# Patient Record
Sex: Female | Born: 1955 | Race: White | Hispanic: No | Marital: Married | Smoking: Never smoker
Health system: Southern US, Community
[De-identification: ages and names within clinical notes are randomized; demographics above are authoritative.]

## PROBLEM LIST (undated history)

## (undated) DIAGNOSIS — M1990 Unspecified osteoarthritis, unspecified site: Secondary | ICD-10-CM

## (undated) DIAGNOSIS — G629 Polyneuropathy, unspecified: Secondary | ICD-10-CM

## (undated) DIAGNOSIS — F419 Anxiety disorder, unspecified: Secondary | ICD-10-CM

## (undated) DIAGNOSIS — M797 Fibromyalgia: Secondary | ICD-10-CM

## (undated) DIAGNOSIS — M14679 Charcot's joint, unspecified ankle and foot: Secondary | ICD-10-CM

## (undated) DIAGNOSIS — E119 Type 2 diabetes mellitus without complications: Secondary | ICD-10-CM

## (undated) DIAGNOSIS — C4491 Basal cell carcinoma of skin, unspecified: Secondary | ICD-10-CM

## (undated) HISTORY — DX: Anxiety disorder, unspecified: F41.9

## (undated) HISTORY — PX: ACHILLES TENDON REPAIR: SUR1153

## (undated) HISTORY — PX: CARPAL TUNNEL RELEASE: SHX101

## (undated) HISTORY — DX: Type 2 diabetes mellitus without complications: E11.9

## (undated) HISTORY — DX: Basal cell carcinoma of skin, unspecified: C44.91

## (undated) HISTORY — DX: Polyneuropathy, unspecified: G62.9

## (undated) HISTORY — PX: COLONOSCOPY: SHX174

## (undated) HISTORY — DX: Unspecified osteoarthritis, unspecified site: M19.90

## (undated) HISTORY — DX: Fibromyalgia: M79.7

## (undated) HISTORY — PX: BREAST SURGERY: SHX581

## (undated) HISTORY — DX: Charcot's joint, unspecified ankle and foot: M14.679

## (undated) HISTORY — PX: OTHER SURGICAL HISTORY: SHX169

---

## 1998-04-20 ENCOUNTER — Other Ambulatory Visit: Admission: RE | Admit: 1998-04-20 | Discharge: 1998-04-20 | Payer: Self-pay | Admitting: Obstetrics and Gynecology

## 1999-10-06 ENCOUNTER — Encounter: Admission: RE | Admit: 1999-10-06 | Discharge: 1999-10-06 | Payer: Self-pay | Admitting: Obstetrics and Gynecology

## 1999-10-06 ENCOUNTER — Encounter: Payer: Self-pay | Admitting: Obstetrics and Gynecology

## 2001-01-08 ENCOUNTER — Encounter: Admission: RE | Admit: 2001-01-08 | Discharge: 2001-01-08 | Payer: Self-pay | Admitting: Obstetrics and Gynecology

## 2001-01-08 ENCOUNTER — Encounter: Payer: Self-pay | Admitting: Obstetrics and Gynecology

## 2002-04-13 ENCOUNTER — Encounter: Admission: RE | Admit: 2002-04-13 | Discharge: 2002-04-13 | Payer: Self-pay | Admitting: Obstetrics and Gynecology

## 2002-04-13 ENCOUNTER — Encounter: Payer: Self-pay | Admitting: Obstetrics and Gynecology

## 2003-05-22 ENCOUNTER — Emergency Department (HOSPITAL_COMMUNITY): Admission: EM | Admit: 2003-05-22 | Discharge: 2003-05-23 | Payer: Self-pay | Admitting: Emergency Medicine

## 2003-09-08 ENCOUNTER — Encounter: Admission: RE | Admit: 2003-09-08 | Discharge: 2003-09-08 | Payer: Self-pay | Admitting: Obstetrics and Gynecology

## 2004-03-23 ENCOUNTER — Ambulatory Visit (HOSPITAL_COMMUNITY): Admission: RE | Admit: 2004-03-23 | Discharge: 2004-03-23 | Payer: Self-pay | Admitting: Family Medicine

## 2004-09-11 ENCOUNTER — Ambulatory Visit (HOSPITAL_COMMUNITY): Admission: RE | Admit: 2004-09-11 | Discharge: 2004-09-11 | Payer: Self-pay | Admitting: Obstetrics and Gynecology

## 2006-03-04 ENCOUNTER — Ambulatory Visit (HOSPITAL_COMMUNITY): Admission: RE | Admit: 2006-03-04 | Discharge: 2006-03-04 | Payer: Self-pay | Admitting: Obstetrics and Gynecology

## 2006-07-26 ENCOUNTER — Ambulatory Visit (HOSPITAL_COMMUNITY): Admission: RE | Admit: 2006-07-26 | Discharge: 2006-07-26 | Payer: Self-pay | Admitting: Family Medicine

## 2006-11-14 ENCOUNTER — Ambulatory Visit: Payer: Self-pay | Admitting: Orthopedic Surgery

## 2007-03-04 ENCOUNTER — Encounter: Admission: RE | Admit: 2007-03-04 | Discharge: 2007-03-04 | Payer: Self-pay | Admitting: Obstetrics and Gynecology

## 2008-05-05 ENCOUNTER — Ambulatory Visit (HOSPITAL_COMMUNITY): Admission: RE | Admit: 2008-05-05 | Discharge: 2008-05-05 | Payer: Self-pay | Admitting: Family Medicine

## 2009-07-12 ENCOUNTER — Ambulatory Visit (HOSPITAL_COMMUNITY): Admission: RE | Admit: 2009-07-12 | Discharge: 2009-07-12 | Payer: Self-pay | Admitting: Internal Medicine

## 2009-10-22 HISTORY — PX: DILATION AND CURETTAGE OF UTERUS: SHX78

## 2009-11-22 ENCOUNTER — Ambulatory Visit (HOSPITAL_COMMUNITY): Admission: RE | Admit: 2009-11-22 | Discharge: 2009-11-22 | Payer: Self-pay | Admitting: Obstetrics and Gynecology

## 2010-01-23 ENCOUNTER — Encounter: Payer: Self-pay | Admitting: Internal Medicine

## 2010-01-27 ENCOUNTER — Ambulatory Visit: Payer: Self-pay | Admitting: Internal Medicine

## 2010-01-27 ENCOUNTER — Ambulatory Visit (HOSPITAL_COMMUNITY): Admission: RE | Admit: 2010-01-27 | Discharge: 2010-01-27 | Payer: Self-pay | Admitting: Internal Medicine

## 2010-10-05 ENCOUNTER — Ambulatory Visit (HOSPITAL_COMMUNITY)
Admission: RE | Admit: 2010-10-05 | Discharge: 2010-10-05 | Payer: Self-pay | Source: Home / Self Care | Attending: Obstetrics and Gynecology | Admitting: Obstetrics and Gynecology

## 2010-11-12 ENCOUNTER — Encounter: Payer: Self-pay | Admitting: Family Medicine

## 2010-11-13 ENCOUNTER — Encounter: Payer: Self-pay | Admitting: Family Medicine

## 2010-11-21 NOTE — Letter (Signed)
Summary: Internal Other Domingo Dimes  Internal Other Domingo Dimes   Imported By: Cloria Spring LPN 16/07/9603 54:09:81  _____________________________________________________________________  External Attachment:    Type:   Image     Comment:   External Document

## 2011-01-08 LAB — URINALYSIS, ROUTINE W REFLEX MICROSCOPIC
Glucose, UA: NEGATIVE mg/dL
Hgb urine dipstick: NEGATIVE
Nitrite: NEGATIVE
Protein, ur: NEGATIVE mg/dL
Urobilinogen, UA: 0.2 mg/dL (ref 0.0–1.0)
pH: 6.5 (ref 5.0–8.0)

## 2011-01-08 LAB — COMPREHENSIVE METABOLIC PANEL
ALT: 22 U/L (ref 0–35)
Albumin: 4.2 g/dL (ref 3.5–5.2)
BUN: 12 mg/dL (ref 6–23)
Chloride: 96 mEq/L (ref 96–112)
GFR calc Af Amer: >60 mL/min (ref >60)
GFR calc non Af Amer: >60 mL/min (ref >60)
Glucose, Bld: 104 mg/dL — ABNORMAL HIGH (ref 70–99)
Potassium: 2.9 mEq/L — ABNORMAL LOW (ref 3.5–5.1)
Sodium: 139 mEq/L (ref 135–145)

## 2011-01-08 LAB — CBC
HCT: 45.1 % (ref 36.0–46.0)
Hemoglobin: 15.3 g/dL — ABNORMAL HIGH (ref 12.0–15.0)
MCHC: 33.8 g/dL (ref 30.0–36.0)

## 2011-01-08 LAB — CA 125: CA 125: 10.9 U/mL (ref 0.0–30.2)

## 2011-01-10 LAB — GLUCOSE, CAPILLARY

## 2011-08-13 ENCOUNTER — Ambulatory Visit (HOSPITAL_COMMUNITY)
Admission: RE | Admit: 2011-08-13 | Discharge: 2011-08-13 | Disposition: A | Discharge status: Home / Self Care | Payer: BC Managed Care – PPO | Source: Ambulatory Visit | Attending: Podiatry | Admitting: Podiatry

## 2011-08-13 DIAGNOSIS — R29898 Other symptoms and signs involving the musculoskeletal system: Secondary | ICD-10-CM | POA: Insufficient documentation

## 2011-08-13 DIAGNOSIS — M6281 Muscle weakness (generalized): Secondary | ICD-10-CM | POA: Insufficient documentation

## 2011-08-13 DIAGNOSIS — M25676 Stiffness of unspecified foot, not elsewhere classified: Secondary | ICD-10-CM | POA: Insufficient documentation

## 2011-08-13 DIAGNOSIS — IMO0001 Reserved for inherently not codable concepts without codable children: Secondary | ICD-10-CM | POA: Insufficient documentation

## 2011-08-13 DIAGNOSIS — M25579 Pain in unspecified ankle and joints of unspecified foot: Secondary | ICD-10-CM | POA: Insufficient documentation

## 2011-08-13 DIAGNOSIS — M25673 Stiffness of unspecified ankle, not elsewhere classified: Secondary | ICD-10-CM | POA: Insufficient documentation

## 2011-08-13 DIAGNOSIS — R262 Difficulty in walking, not elsewhere classified: Secondary | ICD-10-CM | POA: Insufficient documentation

## 2011-08-14 ENCOUNTER — Ambulatory Visit (HOSPITAL_COMMUNITY)
Admission: RE | Admit: 2011-08-14 | Discharge: 2011-08-14 | Disposition: A | Discharge status: Home / Self Care | Payer: BC Managed Care – PPO | Source: Ambulatory Visit | Attending: Podiatry | Admitting: Podiatry

## 2011-08-14 NOTE — Progress Notes (Cosign Needed)
Physical Therapy Evaluation  Patient Details  Name: Angela Norman MRN: [id number] Date of Birth: [birth year]  Today's Date: 08/14/2011 Time: 850-930  Charges: 1 eval Visit#: 1 of 12 Re-eval: 09/12/11   Subjective Symptoms/Limitations Symptoms: Pt reports that she was at work and heard a pop in her ankle on 8/16.  She initally went to her PCP who then referred her to Dr. Nolen Mu.  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  Had surgery on 06/21/11.  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  Job requirments: Sitting most of the day, but requires a lot of standing and sitting.  She is active in her church and lives with her husband.  PMH: Fibromyaligia, DM (w/foot neuropathy), charcot-tooth.   Plan to return to work on Nov. 16th  She has been living with her mother in law secondary to no stairs at her home.   In a cast for 7 weeks.  Transitioned to the boot on 08/07/11 How long can you sit comfortably?: No difficulty, has some increased swelling in dependent position.  How long can you stand comfortably?: standing in the CAM boot without difficulty How long can you walk comfortably?: Ambulating without RW in the house, uses RW for community ambulation.  Has increased weakness to her LE secondary to housebound x7 weeks.  In a cast for 7 weeks.  Pain Assessment Currently in Pain?: Yes Pain Score: 0-No pain Pain Location: Ankle Pain Orientation: Left  Precautions/Restrictions  Precautions Precaution Comments: FWB in CAM boot Required Braces or Orthoses: Yes (CAM) Restrictions Weight Bearing Restrictions: Yes Other Position/Activity Restrictions: FWB in CAM   08/13/11 0900  Assessment  Diagnosis L achilles repair  Surgical Date 06/21/11  Next MD Visit 08/21/11  Prior Therapy None  Precautions  Precaution Comments FWB in CAM boot  Required Braces or Orthoses Yes (CAM)  Restrictions  Weight Bearing  Restrictions Yes  Other Position/Activity Restrictions FWB in CAM  Home Living  Lives With Spouse  Additional Comments 6 stairs at church, 5 stairs at home.   Prior Function  Level of Independence Independent with homemaking with wheelchair;Independent with gait  Able to Take Stairs? Yes  Driving Yes  Vocation Full time employment  Systems developer  Leisure Hobbies-yes (Comment)  Comments Active in her church  Functional Tests  Functional Tests Figure 8:L: 58.3 cm, R: 57 cm Transmetatarsal: L: 30.6cm, R:26.2cm (R Charcot foot deformity )  Functional Tests LEFS: 12/80  LLE AROM (degrees)  Left Ankle Dorsiflexion 0-20 5   Left Ankle Plantar Flexion 0-45 25   Left Ankle Inversion 5  (PROM: 30)  Left Ankle Eversion 20  (PROM: 25)  LLE PROM (degrees)  Left Ankle Dorsiflexion 0-20 -10  (From neutral)  Left Ankle Plantar Flexion 0-45 30  LLE Strength  Left Hip Flexion 4/5  Left Hip ABduction 4/5  Left Hip ADduction 4/5  Left Knee Flexion 4/5  Left Knee Extension 4/5  Left Ankle Dorsiflexion 3/5  Left Ankle Plantar Flexion 2/5  Left Ankle Inversion 3/5  Left Ankle Eversion 3/5  Ambulation/Gait  Ambulation/Gait Yes  Ambulation/Gait Assistance 7: Independent  Assistive device Other (Comment) (CAM)  Gait Pattern Decreased step length - right;Decreased stance time - left;Decreased hip/knee flexion - right (Downward gaze)  Static Standing Balance  Rhomberg - Eyes Closed 10   Rhomberg - Eyes Opened 10   Tandem Stance - Left Leg 3  (impaired ankle/hip strategy)  Tandem Stance - Right Leg 7  (impaired ankle/hip strategy)  Single Leg Stance - Left Leg 0  (impaired ankle/hip strategy)  Single Leg Stance - Right Leg 4  (impaired ankle/hip strategy)  Static Standing - Comment/# of Minutes In CAM boot, held for up to 10 sec    Observation: Wound is healing well, - for s/s of DVT  Exercise/Treatments  08/13/11 0900  Ankle Exercises  Ankle  Circles/Pumps Left;10 reps  Ankle Dorsiflexion Left;10 reps  Ankle Plantar Flexion Left;10 reps  Ankle Eversion Left;10 reps  Ankle Inversion Left;10 reps  Heel Raises 10 reps;Limitations  Heel Raises Limitations Seated  Toe Raise 10 reps;Limitations  Toe Raise Limitations Seated        Physical Therapy Assessment and Plan    Clinical Impression Statement Pt is a 55 year old female referred to PT s/p L achilles repair. After examination it was found that she has current body structure impairments including decreased L ankle ROM, decrease L ankle strength, decreased activity tolerance, difficulty walking and impaired percieved functional ability which are limiting her ability to participate in household and community activities. Pt will benefit from skilled outpatient PT in order to address the above impairments in order to maximize independence and function.  Rehab Potential Clinical Impairments Affecting Rehab Potential PT Frequency Min 3X/week PT Duration 8 weeks PT Plan FWB in CAM BOOT. Ankle isometrics for HEP, add T-band (Red), bike, towel scrunches  Goals  Home Exercise Program: Pt will Perform Home Exercise Program Independently PT Goal: Perform Home Exercise Program - Progress PT Short Term Goals: Time to Complete Short Term Goals 2 weeks PT Short Term Goal 1 Pt will improve L ankle strength by 1 muscle grade. PT Short Term Goal 1 - Progress PT Short Term Goal 2 Pt will improve ankle AROM in each direction by 5 degrees. PT Short Term Goal 2 - Progress PT Short Term Goal 3 Pt will demonstrate SLS in CAM boot x10 sec on static surface. PT Short Term Goal 3 - Progress PT Short Term Goal 4 PT Short Term Goal 4 - Progress PT Short Term Goal 5 PT Short Term Goal 5 - Progress Additional PT Short Term Goals? PT Long Term Goals: Time to Complete Long Term Goals PT Long Term Goal 1 Pt will independently asecend and descend 6 stairs with reciprocal pattern w/1 handrail. PT Long Term Goal 1 - Progress  PT Long Term Goal 2 Pt will increase LE strength in order to ambulate with appropriate gait mechanics to decrease risk of secondary impairments. PT Long Term Goal 2 - Progress Long Term Goal 3 Pt will demonstrate independent tandem gait x50 feet on dynamic surface to improve safety with outdoor mobility.    Problem List Patient Active Problem List  Diagnoses  . Stiffness of joint, not elsewhere classified, ankle and foot  . Difficulty in walking  . Ankle weakness        Yuepheng Schaller 08/14/2011, 4:32 PM  Physician Documentation Your signature is required to indicate approval of the treatment plan as stated above.  Please sign and either send electronically or make a copy of this report for your files and return this physician signed original.   Please mark one 1.__approve of plan  2. ___approve of plan with the following conditions.   ______________________________  _____________________ Physician Signature                                                                                                             Date

## 2011-08-14 NOTE — Progress Notes (Signed)
Physical Therapy Treatment Patient Details  Name: Angela Norman MRN: [id number] Date of Birth: 01-13-1956  Today's Date: 08/14/2011 Time: 4098-1191 Time Calculation (min): 36 min Visit#: 2  of 12   Re-eval: 09/12/11 Charges: Therex x 34'   Subjective: Symptoms/Limitations Symptoms: Pt reports some soreness after last tx. No pain today. Pain Assessment Currently in Pain?: No/denies   Exercise/Treatments Ankle Exercises Ankle Dorsiflexion: 10 reps;Strengthening (isometric) Ankle Plantar Flexion: 10 reps;Strengthening (isometric) Ankle Eversion: 10 reps;Strengthening (isometric) Ankle Inversion: 10 reps;Strengthening (isometric) Heel Raises: 10 reps;Limitations Heel Raises Limitations: Seated Toe Raise: 10 reps;Limitations Toe Raise Limitations: Seated  Towel Crunch: Limitations Towel Crunch Limitations: 1' Towel Inversion/Eversion: 5 reps  PROM all directions  Physical Therapy Assessment and Plan PT Assessment and Plan Clinical Impression Statement: Pt completes therex with minimal difficulty. Pt requires multimodal cueing to avoid knee movement with BAPS board and ev/in with towel. Pt without pain increase at end of session. PT Treatment/Interventions: Therapeutic exercise PT Plan: Continue per PT POC.     Problem List Patient Active Problem List  Diagnoses  . Stiffness of joint, not elsewhere classified, ankle and foot  . Difficulty in walking  . Ankle weakness    PT - End of Session Activity Tolerance: Patient tolerated treatment well General Behavior During Session: [facility] for tasks performed Cognition: [facility] for tasks performed  Antonieta Iba 08/14/2011, 3:52 PM

## 2011-08-15 ENCOUNTER — Ambulatory Visit (HOSPITAL_COMMUNITY)
Admission: RE | Admit: 2011-08-15 | Discharge: 2011-08-15 | Disposition: A | Discharge status: Home / Self Care | Payer: BC Managed Care – PPO | Source: Ambulatory Visit | Attending: Podiatry | Admitting: Podiatry

## 2011-08-15 NOTE — Progress Notes (Signed)
Physical Therapy Treatment Patient Details  Name: Angela Norman MRN: [id number] Date of Birth: 1956-05-23  Today's Date: 08/15/2011 Time: 4098-1191 Time Calculation (min): 41 min Visit#: 3  of 12   Re-eval: 09/12/11 Charges: Therex x 38'  Subjective: Symptoms/Limitations Symptoms: Pt reports some soreness after last tx but not as bad as it has been. Pt is painfree at entry. Pain Assessment Currently in Pain?: No/denies  Precautions/Restrictions     Mobility (including Balance)       Exercise/Treatments Ankle Exercises Ankle Dorsiflexion: 15 reps;Strengthening (isometric) Ankle Plantar Flexion: 15 reps;Strengthening (isometric) Ankle Eversion: 15 reps;Strengthening (isometric) Ankle Inversion: 15 reps (isometric) Heel Raises: 15 reps Heel Raises Limitations: Seated Toe Raise: 15 reps Toe Raise Limitations: Seated  Towel Crunch: Limitations Towel Crunch Limitations: 2' Towel Inversion/Eversion: 5 reps Towel Inversion/Eversion Limitations: 3#   Physical Therapy Assessment and Plan PT Assessment and Plan Clinical Impression Statement: Pt displays icnreased gastroc strength this tx. Pt completes therex with increased eases and decreased need for cueing. Pt with increased control with BAPS board ex. Pt reports no increase in pain at end of session. PT Treatment/Interventions: Therapeutic exercise PT Plan: Continue to progress per PT POC.     Problem List Patient Active Problem List  Diagnoses  . Stiffness of joint, not elsewhere classified, ankle and foot  . Difficulty in walking  . Ankle weakness    PT - End of Session Activity Tolerance: Patient tolerated treatment well General Behavior During Session: [facility] for tasks performed Cognition: [facility] for tasks performed  Antonieta Iba 08/15/2011, 9:31 AM

## 2011-08-20 ENCOUNTER — Ambulatory Visit (HOSPITAL_COMMUNITY)
Admission: RE | Admit: 2011-08-20 | Discharge: 2011-08-20 | Disposition: A | Discharge status: Home / Self Care | Payer: BC Managed Care – PPO | Source: Ambulatory Visit | Attending: Podiatry | Admitting: Podiatry

## 2011-08-20 NOTE — Progress Notes (Signed)
Physical Therapy Treatment Patient Details  Name: Angela Norman MRN: [id number] Date of Birth: [birth year]  Today's Date: 08/20/2011 Time: 1117-1208 Time Calculation (min): 51 min Visit#: 4  of 12   Re-eval: 09/12/11  Charge: therex: 51 min  Subjective: Pain free today    Objective:   Exercise/Treatments Ankle Dorsiflexion: 15 reps;Strengthening (isometric)     10 reps with green tband Ankle Plantar Flexion: 15 reps;Strengthening (isometric)     10 reps with green tband Ankle Eversion: 15 reps;Strengthening (isometric)     10 reps with green tband Ankle Inversion: 15 reps (isometric)     10 reps with green tband Heel Raises: 15 reps  Heel Raises Limitations: Seated  Toe Raise: 15 reps  Toe Raise Limitations: Seated  Gastroc st seated with slant board x 30 sec BAPS seated 10 reps all directions Level 2 Towel Crunch: Limitations  Towel Crunch Limitations: 2'  Towel Inversion/Eversion: 5 reps  Towel Inversion/Eversion Limitations: 3#  Standing: 3x 30" 1 set B UE, 1 set 2 fingers, 1 set 1 finger Lateral step up 4" step 10 reps Forward step up 4" step 10 reps Rockerboard 2' R/L; A/P  Physical Therapy Assessment and Plan PT Assessment and Plan Clinical Impression Statement: Began balance activities standing with boot on, pt required UE A with SLS, able to reduce to 1 finger on parallel bars.  All therex complete with increased eases and decreased need for cueing.  Began tband exercises for ankle strengthening, gave pt tband and worksheet to add to HEP. PT Plan: Continue to progress per PT POC.  Pt to go to MD 08/21/2011, assess therex to be complete without boot.    Goals    Problem List Patient Active Problem List  Diagnoses  . Stiffness of joint, not elsewhere classified, ankle and foot  . Difficulty in walking  . Ankle weakness    PT - End of Session Activity Tolerance: Patient tolerated treatment well General Behavior During Session: [name] for tasks  performed Cognition: [name] for tasks performed  [name] 08/20/2011, 4:07 PM

## 2011-08-22 ENCOUNTER — Ambulatory Visit (HOSPITAL_COMMUNITY)
Admission: RE | Admit: 2011-08-22 | Discharge: 2011-08-22 | Disposition: A | Discharge status: Home / Self Care | Payer: BC Managed Care – PPO | Source: Ambulatory Visit | Attending: Podiatry | Admitting: Podiatry

## 2011-08-22 NOTE — Progress Notes (Signed)
Physical Therapy Treatment Patient Details  Name: Angela Norman MRN: [id number] Date of Birth: 1955/11/25  Today's Date: 08/22/2011 Time: 1191-4782 Time Calculation (min): 50 min Visit#: 5  of 12   Re-eval: 09/12/11 Charges:  therex 30', gait 12'    Subjective:  Pt. Reports more soreness than anything else.  Went to MD yesterday and rec'd orders to begin weaning out of CAM boot.    Exercise/Treatments Stretches Gastroc Stretch: 30 seconds;Limitations Theme park manager Limitations: standing with slant board Aerobic Stationary Bike: 6'@2 .5 seat 8 Standing Heel Raises: 10 reps;Limitations Heel Raises Limitations: toeraises 10 reps Lateral Step Up: Step Height: 4";10 reps Forward Step Up: 10 reps;Step Height: 4" SLS: 2X30" without boot, HHA Gait Training: without CAM walker with SPC Seated Other Seated Knee Exercises: towel crunches 1 min X2 Other Seated Knee Exercises: Towel inv/ever 3# 5X each      Physical Therapy Assessment and Plan PT Assessment and Plan Clinical Impression Statement: Progressed standing activities without boot.  Pt. encouraged to begin ambulating without boot 1/4 of day in home (continue wearing boot in community).  Pt. instructed to bring shoe next visit.   PT Treatment/Interventions: Therapeutic exercise;Gait training PT Plan: Continue per POC; wean out of boot and increase stability of LE.    Problem List Patient Active Problem List  Diagnoses  . Stiffness of joint, not elsewhere classified, ankle and foot  . Difficulty in walking  . Ankle weakness    PT - End of Session Activity Tolerance: Patient tolerated treatment well General Behavior During Session: [facility] for tasks performed Cognition: [facility] for tasks performed  Emeline Gins B 08/22/2011, 1:48 PM

## 2011-08-24 ENCOUNTER — Ambulatory Visit (HOSPITAL_COMMUNITY)
Admission: RE | Admit: 2011-08-24 | Discharge: 2011-08-24 | Disposition: A | Discharge status: Home / Self Care | Payer: BC Managed Care – PPO | Source: Ambulatory Visit | Attending: Podiatry | Admitting: Podiatry

## 2011-08-24 DIAGNOSIS — M25676 Stiffness of unspecified foot, not elsewhere classified: Secondary | ICD-10-CM | POA: Insufficient documentation

## 2011-08-24 DIAGNOSIS — M6281 Muscle weakness (generalized): Secondary | ICD-10-CM | POA: Insufficient documentation

## 2011-08-24 DIAGNOSIS — M25673 Stiffness of unspecified ankle, not elsewhere classified: Secondary | ICD-10-CM | POA: Insufficient documentation

## 2011-08-24 DIAGNOSIS — R262 Difficulty in walking, not elsewhere classified: Secondary | ICD-10-CM | POA: Insufficient documentation

## 2011-08-24 DIAGNOSIS — M25579 Pain in unspecified ankle and joints of unspecified foot: Secondary | ICD-10-CM | POA: Insufficient documentation

## 2011-08-24 DIAGNOSIS — IMO0001 Reserved for inherently not codable concepts without codable children: Secondary | ICD-10-CM | POA: Insufficient documentation

## 2011-08-24 NOTE — Progress Notes (Signed)
Physical Therapy Treatment Patient Details  Name: Angela Norman MRN: [id number] Date of Birth: 1956/04/14  Today's Date: 08/24/2011 Time: 4098-1191 Time Calculation (min): 43 min Visit#: 6  of 12   Re-eval: 09/12/11 Charges: Gait training x 10' Therex x 30'  Subjective: Symptoms/Limitations Symptoms: I feel very drained. I was sore in my knees after using the bike last time. Pain Assessment Currently in Pain?: No/denies Pain Score: 0-No pain   Exercise/Treatments Standing Heel Raises: 10 reps;Limitations Heel Raises Limitations: toeraises 10 reps Lateral Step Up: Step Height: 4";10 reps Forward Step Up: 10 reps;Step Height: 4" SLS: 3X30" without boot, 1 finger assist Slant board stretch  3x30" Seated BAPS: Level 2;10 reps;Sitting  Gt around dept with walking shoe w/o AD x 10'  Physical Therapy Assessment and Plan PT Assessment and Plan Clinical Impression Statement: Pt brought walking shoe that was given to her at the hospital. Gt training completed in walking shoe. Therex completed without orthotic. Pt requires TC's to disassociate forefoot from metatarsals with seated heel raise.  PT Treatment/Interventions: Therapeutic exercise;Gait training PT Plan: Continue to progress per PT POC.     Problem List Patient Active Problem List  Diagnoses  . Stiffness of joint, not elsewhere classified, ankle and foot  . Difficulty in walking  . Ankle weakness    PT - End of Session Activity Tolerance: Patient tolerated treatment well General Behavior During Session: [facility] for tasks performed Cognition: [facility] for tasks performed  Antonieta Iba 08/24/2011, 10:36 AM

## 2011-08-27 ENCOUNTER — Ambulatory Visit (HOSPITAL_COMMUNITY)
Admission: RE | Admit: 2011-08-27 | Discharge: 2011-08-27 | Disposition: A | Discharge status: Home / Self Care | Payer: BC Managed Care – PPO | Source: Ambulatory Visit | Attending: Family Medicine | Admitting: Family Medicine

## 2011-08-27 NOTE — Progress Notes (Signed)
Physical Therapy Treatment Patient Details  Name: Angela Norman MRN: [id number] Date of Birth: [birth year]  Today's Date: 08/27/2011 Time: 1030-1105 Time Calculation (min): 35 min Visit#: 7  of 12   Re-eval: 09/10/11 Charges:  therex 32 minutes    Subjective: Symptoms/Limitations Symptoms: Pt. states she prefers not to use the bike.  Pt. states she has several different shoes she could wear.  Recommended using her diabetic shoe at church, and the othopedic shoe at other times. Pain Assessment Currently in Pain?: No/denies   Exercise/Treatments Stretches Gastroc Stretch: 3 reps;30 seconds;Limitations Gastroc Stretch Limitations: standing with slant board Standing Heel Raises: 15 reps Heel Raises Limitations: toeraises 15 reps Lateral Step Up: 10 reps;Step Height: 4" Forward Step Up: 10 reps;Step Height: 4" Step Down: 10 reps;Step Height: 2" SLS: 3X30" without boot, 1 finger assist Gait Training: without CAM walker with SPC Other Standing Knee Exercises: Retro gait 1RT Other Standing Knee Exercises: tandem gait 1RT Seated Other Seated Knee Exercises: BAPS L3 10X each way     Physical Therapy Assessment and Plan PT Assessment and Plan Clinical Impression Statement: Improved gait with decreased pain/soreness.  Biggest difficulty is balance/stability.  Added retro and tandem gait to address deficits.   PT Plan: Returns to MD 11/20; See 3 more visits then re-evaluate for MD appt.  Continue to increase stability.     Problem List Patient Active Problem List  Diagnoses  . Stiffness of joint, not elsewhere classified, ankle and foot  . Difficulty in walking  . Ankle weakness    PT - End of Session Activity Tolerance: Patient tolerated treatment well General Behavior During Session: [facility] for tasks performed Cognition: [facility] for tasks performed  Angela Norman, Angela Norman B 08/27/2011, 11:07 AM

## 2011-08-28 ENCOUNTER — Other Ambulatory Visit (HOSPITAL_COMMUNITY): Payer: Self-pay | Admitting: Obstetrics and Gynecology

## 2011-08-28 DIAGNOSIS — Z139 Encounter for screening, unspecified: Secondary | ICD-10-CM

## 2011-08-29 ENCOUNTER — Ambulatory Visit (HOSPITAL_COMMUNITY)
Admission: RE | Admit: 2011-08-29 | Discharge: 2011-08-29 | Disposition: A | Discharge status: Home / Self Care | Payer: BC Managed Care – PPO | Source: Ambulatory Visit | Attending: Family Medicine | Admitting: Family Medicine

## 2011-08-29 NOTE — Progress Notes (Signed)
Physical Therapy Treatment Patient Details  Name: Angela Norman MRN: [id number] Date of Birth: Jan 01, 1956  Today's Date: 08/29/2011 Time: 4098-1191 Time Calculation (min): 47 min Charges: 30' TE, 17' NMR Visit#: 8  of 12   Re-eval: 09/10/11    Subjective: Symptoms/Limitations Symptoms: Pt reports that overall she was having so much body pain from the bike.  She comes in with a smaller orthotic shoe.  She reports she continues to use a cane in the community. Pain Assessment Currently in Pain?: No/denies  Exercise/Treatments Aerobic Tread Mill: 5' 0.81mph, cueing for stride length and stance time w/BUE support Standing Heel Raises: 20 reps Heel Raises Limitations: toeraises 15 reps Lateral Step Up: 10 reps;Step Height: 4";Limitations Lateral Step Up Limitations: W/intermittent HHA, encouraged to complete without hand held assist Functional Squat: 15 reps SLS: 3X30" with ortho shoe, 1 finger assist Gait Training: Forward/backward weight shifting 1 min each lower extremity to improve pelvic rotation Other Standing Knee Exercises: Retro gait 2RT, cueing for awarness, tandem gait 1RT w/min A w/cueing for awarness Other Standing Knee Exercises: Roll in and outs with heels and toes 5x5 sec each.   Ankle Exercises Ankle Dorsiflexion: AROM;20 reps;Supine;Limitations Ankle Dorsiflexion Limitations: manual resistance Ankle Plantar Flexion: AROM;20 reps;Supine;Limitations Ankle Plantar Flexion Limitations: manual resistance Ankle Eversion: AROM;20 reps;Supine Ankle Eversion Limitations: manual resistance Ankle Inversion: AROM;20 reps;Supine Ankle Inversion Limitations: manual resistance  SLS: 3X30" with ortho shoe, 1 finger assist  Physical Therapy Assessment and Plan PT Assessment and Plan Clinical Impression Statement: Pt is limited by her other medical conditions including fibromyalgia.  Today's treatment focus on body awareness to improve balance.  Pt continues to require  min A with dynamic mobility balance testing secondary to LOB.   Added TM walking, roll ins/outs, lateral step ups without bars  PT Plan: Re-eval in two more visits.  Cont to improve balance and awarness with activities.     Goals    Problem List Patient Active Problem List  Diagnoses  . Stiffness of joint, not elsewhere classified, ankle and foot  . Difficulty in walking  . Ankle weakness    PT - End of Session Activity Tolerance: Patient tolerated treatment well  Angela Norman 08/29/2011, 9:59 AM

## 2011-08-31 ENCOUNTER — Ambulatory Visit (HOSPITAL_COMMUNITY)
Admission: RE | Admit: 2011-08-31 | Discharge: 2011-08-31 | Disposition: A | Discharge status: Home / Self Care | Payer: BC Managed Care – PPO | Source: Ambulatory Visit | Attending: Family Medicine | Admitting: Family Medicine

## 2011-08-31 NOTE — Progress Notes (Signed)
Physical Therapy Treatment Patient Details  Name: Angela Norman MRN: [id number] Date of Birth: May 21, 1956  Today's Date: 08/31/2011 Time: 4098-1191 Time Calculation (min): 43 min Charges: NMR x43 Visit#: 9  of 12   Re-eval: 09/10/11    Subjective: Symptoms/Limitations Symptoms: She reports a little bit of heel pain after walking on the TM that resolved.  She reports she is working very hard at home to correct her walking pattern.  Pain Assessment Currently in Pain?: No/denies  Exercise/Treatments Stretches Gastroc Stretch: 3 reps;30 seconds;Limitations Soleus Stretch: 3 reps;30 seconds Aerobic Tread Mill: 5' 0.11mph, cueing for stride length and stance time w/BUE support Standing Gait Training: Forward/backward weight shifting x5 min each lower extremity to improve pelvic rotation and toe mechanics w/multimodal cueing  Ankle Exercises Heel Raises Limitations: NMR activities to improve toe motion in sitting and standing.  Multimodal cueing Toe Raise Limitations: NMR activities to improve toe motion in sitting and standing. Multimodal cueing    Physical Therapy Assessment and Plan PT Assessment and Plan Clinical Impression Statement: Today's treatment focused primarily on NMR control for toe extension to improve toe off with walking.  Noticed pt had increased toe flexion during toe off.  She has increased swelling to her distal tibia region, along with her periphreal neuropathy which may be inhibiting her ability to activate her extensor digitorium.  She has full control of extensor hallicus.  After treatment she had notable improvement with decrease in toe flexion with gait, however gait remains antalgic secondary to hip and LE weakness.  PT Plan: Cont to focus on proper foot and LE mechanics with gait, utilize RLE to demonstrate appropraiate movement in order for her to visualize proper form secondary to decreased feeling in foot from neuropathy.     Goals    Problem  List Patient Active Problem List  Diagnoses  . Stiffness of joint, not elsewhere classified, ankle and foot  . Difficulty in walking  . Ankle weakness    PT - End of Session Activity Tolerance: Patient tolerated treatment well  Angela Norman 08/31/2011, 9:46 AM

## 2011-09-03 ENCOUNTER — Ambulatory Visit (HOSPITAL_COMMUNITY)
Admission: RE | Admit: 2011-09-03 | Discharge: 2011-09-03 | Disposition: A | Discharge status: Home / Self Care | Payer: BC Managed Care – PPO | Source: Ambulatory Visit | Attending: Family Medicine | Admitting: Family Medicine

## 2011-09-03 NOTE — Progress Notes (Signed)
Physical Therapy Progress Note  Patient Details  Name: Angela Norman MRN: [id number] Date of Birth: Sep 07, 1956  Today's Date: 09/03/2011 Time: 9147-8295 Time Calculation (min): 46 min Charges: 1 ROM, 1 MMT, 30' NMR Visit#: 10  of 18   Re-eval: 10/03/11    Past Medical History: No past medical history on file. Past Surgical History: No past surgical history on file.  Subjective Symptoms/Limitations Symptoms: I was able to go to church, but I still feel like I have a lot of difficulty with my overall balance and my walking.  Pain Assessment Currently in Pain?: No/denies  Assessment LLE AROM (degrees) Left Ankle Dorsiflexion 0-20: 15  Left Ankle Plantar Flexion 0-45: 28  Left Ankle Inversion: 28  (30) Left Ankle Eversion: 20  (25) LLE Strength Left Ankle Dorsiflexion: 3+/5 Left Ankle Plantar Flexion: 2/5 Left Ankle Inversion: 3+/5 Left Ankle Eversion: 3/5  LEFS: 49/80 (12/80) Balance: L SLS: 2 sec Tandem: L - 30 sec Tandem R - 30 sec Edema: L ankle: Figure 8: 37.5 cm; Transmetatarsal: 29cm Gait: Decreased L toe extension during toe off.  Decreased stance on LLE, Decreased step length on R  Exercise/Treatments Today's treatment session focused on NMR to improve L extensor digitorum activiation to improve gait. Gait: 2x around gym w/cuing for toe off Seated w/shoes and socks off: Bilateral Toe Extension w/mulitmodal cueing x20 Bilateral Toe Extension w/heel raise x20 Toe and Heel Roll ins and outs 10x5 sec hold Bilateral Toe Extension w/toes on slide board w/mulitmodal cueing x20 (1/5 activation of digitorum extension) SLS w/L bare foot x2 sec. Best time.  1x30 sec w/1 finger assist Tandem Stance bare foot: 30 sec BLE; w/L foot in front w/B HHA Toe extension x10     Physical Therapy Assessment and Plan PT Assessment and Plan Clinical Impression Statement: Angela Norman has attended 10 OPPT visits.  She continues to have the greatest amount of difficulty with  coordinating toe extension and toe off during gait secondary to periphreal neuropathy.  She has improved with her tandem stance balance, however continues to have difficulty with single leg balance.  She is able to demonstrate the greatest amount of extensor digitorium activation with SLS. She will continue to benefit from skilled PT in order to address balance and gait activities to decrease risk of secondary impairments and improve community and work functions.   PT Frequency: Min 2X/week PT Duration: 4 weeks PT Treatment/Interventions: Gait training;Therapeutic exercise;Neuromuscular re-education;Balance training;Functional mobility training;Stair training;Patient/family education PT Plan: Cont 2x/wk x4 weeks.  Focus on toe extension and toe off during gait which is demonstrated best by single leg stance activities.     Goals Home Exercise Program Pt will Perform Home Exercise Program: Independently PT Short Term Goals Time to Complete Short Term Goals: 2 weeks PT Short Term Goal 1: Pt will improve L ankle strength by 1 muscle grade. PT Short Term Goal 1 - Progress: Partly met PT Short Term Goal 2: Pt will improve ankle AROM in each direction by 5 degrees.  PT Short Term Goal 2 - Progress: Met PT Short Term Goal 3: Pt will demonstrate SLS in CAM boot x10 sec on static surface.  PT Short Term Goal 3 - Progress: Met PT Long Term Goals Time to Complete Long Term Goals: 8 weeks PT Long Term Goal 1: Pt will independently asecend and descend 6 stairs with reciprocal pattern w/1 handrail. PT Long Term Goal 1 - Progress: Partly met PT Long Term Goal 2: 2.Pt will improve ankle AROM to  WFL in order to ambulate with appropriate gait mechanics to decrease risk of secondary impairments. PT Long Term Goal 2 - Progress: Not met Long Term Goal 3: Pt will demonstrate independent tandem gait x50 feet on dynamic surface to improve safety with outdoor mobility.  Long Term Goal 3 Progress: Not met Long Term  Goal 4: 4.Pt will present with decreased L ankle edema for improved ankle ROM Long Term Goal 4 Progress: Met  Problem List Patient Active Problem List  Diagnoses  . Stiffness of joint, not elsewhere classified, ankle and foot  . Difficulty in walking  . Ankle weakness    PT - End of Session Activity Tolerance: Patient tolerated treatment well   Abagael Kramm 09/03/2011, 9:44 AM  Physician Documentation Your signature is required to indicate approval of the treatment plan as stated above.  Please sign and either send electronically or make a copy of this report for your files and return this physician signed original.   Please mark one 1.__approve of plan  2. ___approve of plan with the following conditions.   ______________________________                                                          _____________________ Physician Signature                                                                                                             Date

## 2011-09-05 ENCOUNTER — Ambulatory Visit (HOSPITAL_COMMUNITY)
Admission: RE | Admit: 2011-09-05 | Discharge: 2011-09-05 | Disposition: A | Discharge status: Home / Self Care | Payer: BC Managed Care – PPO | Source: Ambulatory Visit | Attending: Family Medicine | Admitting: Family Medicine

## 2011-09-05 NOTE — Progress Notes (Signed)
Physical Therapy Treatment Patient Details  Name: Angela Norman MRN: [id number] Date of Birth: January 25, 1956  Today's Date: 09/05/2011 Time: 4098-1191 Time Calculation (min): 37 min Visit#: 1  of 18   Re-eval: 10/03/11  Charge: therex 37 min  Subjective: Symptoms/Limitations Symptoms: Went to MD yesterday and go back the 27th,  if I'm ok at that point he will allow me to go back to work.  No pain in heel my spur on the lateral part of left foot is bothering me today. Pain Assessment Currently in Pain?: No/denies  Objective:   Exercise/Treatments Today's treatment session focused on NMR to improve L extensor digitorum activiation to improve gait.  Standing Heel Raises: 20 reps Heel Raises Limitations: toeraises 20 reps SLS: 3x 60" barefoot with 1 finger assistance; L SLS with no HHA 3" max of 3 Gait 2 RT with cueing for toe off   Ankle Exercises sitting: Heel Raises Limitations: NMR activities to improve toe motion in sitting and standing.  Multimodal cueing Toe Raise Limitations: NMR activities to improve toe motion in sitting and standing. Multimodal cueing Bilateral Toe Extension w/mulitmodal cueing x20  Bilateral Toe Extension w/heel raise x20  Toe and Heel Roll ins and outs 10x5 sec hold  Bilateral Toe Extension w/toes on slide board w/mulitmodal cueing x20 (1/5 activation of digitorum extension)   Physical Therapy Assessment and Plan PT Assessment and Plan Clinical Impression Statement: Pt tolerated well towards total treatment.  Pt continues to show no extensor digitorium activation in toes 2-4, can extend big toe.  She does still continue to have difficutly with balance activities.   PT Plan: Cont 2x/wk x4 weeks. Focus on toe extension and toe off during gait which is demonstrated best by single leg stance activities    Goals    Problem List Patient Active Problem List  Diagnoses  . Stiffness of joint, not elsewhere classified, ankle and foot  . Difficulty  in walking  . Ankle weakness    PT - End of Session Activity Tolerance: Patient tolerated treatment well General Behavior During Session: [facility] for tasks performed Cognition: [facility] for tasks performed  [name] 09/05/2011, 12:50 PM

## 2011-09-07 ENCOUNTER — Ambulatory Visit (HOSPITAL_COMMUNITY)
Admission: RE | Admit: 2011-09-07 | Discharge: 2011-09-07 | Disposition: A | Discharge status: Home / Self Care | Payer: BC Managed Care – PPO | Source: Ambulatory Visit | Attending: Physical Therapy | Admitting: Physical Therapy

## 2011-09-07 NOTE — Progress Notes (Signed)
Physical Therapy Treatment Patient Details  Name: Angela Norman MRN: [id number] Date of Birth: 02-04-1956  Today's Date: 09/07/2011 Time: 4098-1191 Time Calculation (min): 44 min Visit#: 12  of 18   Re-eval: 10/03/11  Charge: therex: 38 min Gait 6 min   Subjective: Symptoms/Limitations Symptoms: No pain in heel, my legs and back are achey today. Pain Assessment Currently in Pain?: No/denies  Objective:   Exercise/Treatments Standing Heel Raises: 20 reps seated and standing Heel Raises Limitations: toeraises 20 reps seated and standing SLS: 3x 60" barefoot with 1 finger assistance; L SLS with no HHA 2" max of 3 Gait Training: 3RT with cueing for toe off gait Ankle Exercises Heel Raises Limitations: NMR activities to improve toe motion in sitting and standing.  Multimodal cueing Toe Raise Limitations: NMR activities to improve toe motion in sitting and standing. Multimodal cueing.  Trial with [place] estim Toe extension on flat board 20 reps 5" holds  SLS: 3x 60" barefoot with 1 finger assistance; L SLS with no HHA 2" max of 3  Modalities Modalities: Archivist Stimulation Location: attempted extensor digitorium Electrical Stimulation Action: to extend digits 2-4 Electrical Stimulation Parameters: Russian 10/30 Electrical Stimulation Goals: Neuromuscular facilitation  Physical Therapy Assessment and Plan PT Assessment and Plan Clinical Impression Statement: Attempted Russian estim to activite extensor digitiorum longus and brevis, unable to get contraction with multiple placement attempts. PT Plan: Continue attempting contraction with [place] estim and begin PNF patterns D1 and D2.    Goals    Problem List Patient Active Problem List  Diagnoses  . Stiffness of joint, not elsewhere classified, ankle and foot  . Difficulty in walking  . Ankle weakness    PT - End of Session Activity Tolerance: Patient tolerated  treatment well General Behavior During Session: [facility] for tasks performed Cognition: [facility] for tasks performed  [name] 09/07/2011, 12:23 PM

## 2011-09-11 ENCOUNTER — Ambulatory Visit (HOSPITAL_COMMUNITY)
Admission: RE | Admit: 2011-09-11 | Discharge: 2011-09-11 | Disposition: A | Discharge status: Home / Self Care | Payer: BC Managed Care – PPO | Source: Ambulatory Visit | Attending: Family Medicine | Admitting: Family Medicine

## 2011-09-11 NOTE — Progress Notes (Signed)
Physical Therapy Treatment Patient Details  Name: Angela Norman MRN: [id number] Date of Birth: Mar 04, 1956  Today's Date: 09/11/2011 Time: 1324-4010 Time Calculation (min): 46 min Charges: NMR x46' Visit#: 13  of 18   Re-eval: 09/17/11    Subjective: Symptoms/Limitations Symptoms: Pt reports that she is a little depressed in the morning and is having increasing numbness to her entire L lower leg.   Exercise/Treatments NMR training: Long Sitting DF and PF with toe extension.  PNF ankle patterns 10x each direction flexion and extension - promote toe extension (4 directions) Bridging with DF and toe extension x20 Seated heel raises w/cueing of toe extension to keeping toes flat x20   Modalities Modalities: Archivist Stimulation Location: L lateral lower leg to extensor digitorium and hallicus, with greater activation to hallicus Electrical Stimulation Action: To extend L toe digits Electrical Stimulation Parameters: Russian E-stim 10/30 w/multimodal cueing to promote  extension x15 minutes (5 minute set up for appropriate placement, 10 minutes treatment time) Electrical Stimulation Goals: Strength  Physical Therapy Assessment and Plan PT Assessment and Plan Clinical Impression Statement: Today's treatment focused on all NMR to improve gait mechanics.  She had improved great toe extension after utlizing multimodal cueing and modalities.   PT Plan: Cont to address gait mechanics.     Goals    Problem List Patient Active Problem List  Diagnoses  . Stiffness of joint, not elsewhere classified, ankle and foot  . Difficulty in walking  . Ankle weakness    PT - End of Session Activity Tolerance: Patient tolerated treatment well  Vyctoria Dickman 09/11/2011, 9:32 AM

## 2011-09-12 ENCOUNTER — Ambulatory Visit (HOSPITAL_COMMUNITY)
Admission: RE | Admit: 2011-09-12 | Discharge: 2011-09-12 | Disposition: A | Discharge status: Home / Self Care | Payer: BC Managed Care – PPO | Source: Ambulatory Visit | Attending: Family Medicine | Admitting: Family Medicine

## 2011-09-12 NOTE — Progress Notes (Signed)
Physical Therapy Treatment Patient Details  Name: Angela Norman MRN: [id number] Date of Birth: Feb 18, 1956  Today's Date: 09/12/2011 Time: 1478-2956 Time Calculation (min): 44 min Charges: NMR 44' Visit#: 14  of 18   Re-eval: 09/17/11    Subjective: Symptoms/Limitations Symptoms: Pt reports that she can feel her leg better today after recieving treatment yesterday.   Pain Assessment Currently in Pain?: No/denies  Exercise/Treatments Gt training w/tactile and verbal cueing x10 minutes Figure 8 with feet to promote appropriate movement A<>P Weight shifting w/verbal and tactile cueing x5 minutes Standing toe only raises to BLE 2x10 w/pt leaning on wall Tandem Stance x1 minute BLE SLS LLE only w/B HHA x1 minute Side Lunges 20x each direction - promote weightbearing to inside of L foot. Modalities Modalities: Copywriter, advertising Location: L lateral lower leg to extensor digitorium and hallicus, with greater activation to hallicus  Electrical Stimulation Action: To extend L toe digits  Electrical Stimulation Parameters: Russian E-stim 10/30 w/multimodal cueing to promote extension x15 minutes   Physical Therapy Assessment and Plan PT Assessment and Plan Clinical Impression Statement: Cont to focus on NMR today.  She has improved foot mechanics with ambulation with moderate multimodal cueing.  PT Plan: Cont to address gait mechanics. Re-eval next session.     Goals    Problem List Patient Active Problem List  Diagnoses  . Stiffness of joint, not elsewhere classified, ankle and foot  . Difficulty in walking  . Ankle weakness    PT - End of Session Activity Tolerance: Patient tolerated treatment well  Michaline Kindig 09/12/2011, 9:31 AM

## 2011-09-17 ENCOUNTER — Ambulatory Visit (HOSPITAL_COMMUNITY)
Admission: RE | Admit: 2011-09-17 | Discharge: 2011-09-17 | Disposition: A | Discharge status: Home / Self Care | Payer: BC Managed Care – PPO | Source: Ambulatory Visit | Attending: Family Medicine | Admitting: Family Medicine

## 2011-09-17 NOTE — Progress Notes (Addendum)
Physical Therapy Treatment-Progress Note Patient Details  Name: Angela Norman MRN: [id number] Date of Birth: Aug 14, 1956  Today's Date: 09/17/2011 Time: 1110-1200 Time Calculation (min): 50 min Charges: 1 ROM, 1 MMT, 25' NMR Visit#: 15  of 18   Re-eval: 09/17/11   Subjective: Symptoms/Limitations Symptoms: Pt reports that she continues to have the greatest amount of difficulty with diabetic neuropathy.  She is having numbness up past her knee into her distal thigh.   Pain Assessment Currently in Pain?: No/denies  09/17/11 1100  LLE AROM (degrees)  Left Ankle Dorsiflexion 0-20 15   Left Ankle Plantar Flexion 0-45 40   LLE Strength  Left Ankle Dorsiflexion 5/5  Left Ankle Plantar Flexion 3/5  Left Ankle Inversion 4/5  Left Ankle Eversion 4/5  Static Standing Balance  Single Leg Stance - Right Leg 23   Single Leg Stance - Left Leg 4     Exercise/Treatments Balance Exercises - Exercise completed by Emeline Gins, PTA Stationary Bike: 8' 2.0 Tandem Walking: 2 round trips;Limitations Tandem Walking Limitations: Tandem walking on balance beam 2 RT Retro Gait: 2 round trips Single Limb Stance: Right;Left;Limitations Single Limb Stance Limitations: R: 15 sec, L 4 se Heel Raises: 10 reps;Limitations Heel Raises Limitations: w/BUE support w/increased difficulty Stairs 2 RT x6 stairs w/1 handrail Physical Therapy Assessment and Plan PT Assessment and Plan Clinical Impression Statement: Ms. Heinrich has attended 15 OPPT visits.  She has met 3 of 4 STG and 1/4 LTG.  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  She has been able to achieve goals related to her achilles tendon repair.  She will continue to benefit from ankle exercises at home.  She would benefit  from skilled OPPT in order to address above impairments which are likely due to conditions before her achilles tear.  PT Frequency: Min 2X/week PT Duration: 4 weeks PT Treatment/Interventions: Balance training;Gait training;Stair training;Therapeutic exercise;Patient/family education;Neuromuscular re-education PT Plan: F/U after MD apt.  Continue to address appropriate gait mechanics and balance.     Goals Home Exercise Program Pt will Perform Home Exercise Program: Independently PT Short Term Goals Time to Complete Short Term Goals: 2 weeks PT Short Term Goal 1: Pt will improve L ankle strength by 1 muscle grade. PT Short Term Goal 1 - Progress: Met PT Short Term Goal 2: Pt will improve ankle AROM in each direction by 5 degrees.  PT Short Term Goal 2 - Progress: Met PT Short Term Goal 3: Pt will demonstrate SLS in CAM boot x10 sec on static surface.  PT Short Term Goal 3 - Progress: Met PT Long Term Goals Time to Complete Long Term Goals: 8 weeks PT Long Term Goal 1: Pt will independently asecend and descend 6 stairs with reciprocal pattern w/1 handrail. PT Long Term Goal 2: 2.Pt will improve ankle AROM to [facility] in order to ambulate with appropriate gait mechanics to decrease risk of secondary impairments. PT Long Term Goal 2 - Progress: Partly met Long Term Goal 3: Pt will demonstrate independent tandem gait x50 feet on dynamic surface to improve safety with outdoor mobility.  Long Term Goal 4: 4.Pt will present with decreased L ankle edema for improved ankle ROM Long Term Goal 4 Progress: Met  Problem List Patient Active Problem List  Diagnoses  . Stiffness of joint, not elsewhere classified,  ankle and foot  . Difficulty in walking  . Ankle weakness    PT - End of Session Activity Tolerance: Patient tolerated treatment well  Angela Norman 09/17/2011, 1:00 PM

## 2011-09-19 ENCOUNTER — Ambulatory Visit (HOSPITAL_COMMUNITY)
Admission: RE | Admit: 2011-09-19 | Discharge: 2011-09-19 | Disposition: A | Discharge status: Home / Self Care | Payer: BC Managed Care – PPO | Source: Ambulatory Visit | Attending: Family Medicine | Admitting: Family Medicine

## 2011-09-19 NOTE — Progress Notes (Signed)
Physical Therapy Treatment Patient Details  Name: Angela Norman MRN: [id number] Date of Birth: December 08, 1955  Today's Date: 09/19/2011 Time: 5284-1324 Time Calculation (min): 37 min Visit#: 16  of 22   Re-eval: 09/24/11 Charges:  Gait 28'    Subjective: Symptoms/Limitations Symptoms: Pt. states MD approved working on balance, however pt feels she only needs a couple more visits so will continue 1X week for the next 3 weeks.  Pt. goes back to work on Monday. Pain Assessment Currently in Pain?: No/denies  Exercise/Treatments Balance Exercises Stationary Bike: 8' 4.0 seat 10 Tandem Walking: 2 round trips Retro Gait: 2 round trips Step Over [place] (Round Trips): 2RT with hurdles Single Limb Stance: Right;Left;Limitations Single Limb Stance Limitations: R: 15 sec, L 4 se Balance Beam: 2RT  Physical Therapy Assessment and Plan PT Assessment and Plan Clinical Impression Statement: Pt. unstable with hurdles and balance beam, requiring min-mod assist to correct/maintain balance.  difficulty negotiating hurdles tandemly, requiring step to to keep balance. PT Plan: Continue X 3 more visits with focus on balance.     Problem List Patient Active Problem List  Diagnoses  . Stiffness of joint, not elsewhere classified, ankle and foot  . Difficulty in walking  . Ankle weakness    PT - End of Session Activity Tolerance: Patient tolerated treatment well General Behavior During Session: [facility] for tasks performed Cognition: [facility] for tasks performed  Emeline Gins B 09/19/2011, 9:41 AM

## 2011-09-24 ENCOUNTER — Ambulatory Visit (HOSPITAL_COMMUNITY)
Admission: RE | Admit: 2011-09-24 | Discharge: 2011-09-24 | Disposition: A | Discharge status: Home / Self Care | Payer: BC Managed Care – PPO | Source: Ambulatory Visit | Attending: Podiatry | Admitting: Podiatry

## 2011-09-24 DIAGNOSIS — M25579 Pain in unspecified ankle and joints of unspecified foot: Secondary | ICD-10-CM | POA: Insufficient documentation

## 2011-09-24 DIAGNOSIS — R262 Difficulty in walking, not elsewhere classified: Secondary | ICD-10-CM | POA: Insufficient documentation

## 2011-09-24 DIAGNOSIS — M25673 Stiffness of unspecified ankle, not elsewhere classified: Secondary | ICD-10-CM | POA: Insufficient documentation

## 2011-09-24 DIAGNOSIS — M25676 Stiffness of unspecified foot, not elsewhere classified: Secondary | ICD-10-CM | POA: Insufficient documentation

## 2011-09-24 DIAGNOSIS — M6281 Muscle weakness (generalized): Secondary | ICD-10-CM | POA: Insufficient documentation

## 2011-09-24 DIAGNOSIS — IMO0001 Reserved for inherently not codable concepts without codable children: Secondary | ICD-10-CM | POA: Insufficient documentation

## 2011-09-24 NOTE — Progress Notes (Signed)
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  Pt stated she felt unstable walking in and out of work on uneven surfaces, pt. recommended to amb with came on dynamic surfaces she felt uneasy  walking on. Pain Assessment Currently in Pain?: No/denies  Objective:   Exercise/Treatments Balance Exercises Stationary Bike: 8' 4.0 seat 10 Tandem Walking: 2 round trips Tandem Walking Limitations: Tandem walking on balance beam 2 RT Retro Gait: 2 round trips Step Over [place] (Round Trips): 2RT with hurdles Single Limb Stance: Right;Left;Limitations Single Limb Stance Limitations: R 13 sec, L 7 sec Balance Master: Limits for Stability: Test 49" with B HHA Balance Master: Static: Test complete Heel Raises: 15 reps;Limitations Heel Raises Limitations: w/ HHA Toe Raise: 15 reps Balance Beam: 2RT Balance Poses      Physical Therapy Assessment and Plan PT Assessment and Plan Clinical Impression Statement: Began balance master test to assess pt. dynamic balance and LOS, pt required B UE.  Pt unstable with hurdles and balance beam, required mod assistance to reduce risk of falls.  Pt stated she felt unsteady walking in <-> out of work on uneven surface, pt recommended to amb with cane on surfaces she felt uneasy on, outside on dynamic surfaces.  PT Plan: Continue x2 more visits with focus on balance.    Goals    Problem List Patient Active Problem List  Diagnoses  . Stiffness of joint, not elsewhere classified, ankle and foot  . Difficulty in walking  . Ankle weakness    PT - End of Session Activity Tolerance: Patient tolerated treatment  well General Behavior During Session: [facility] for tasks performed Cognition: [facility] for tasks performed  Angela Norman 09/24/2011, 6:17 PM

## 2011-09-26 ENCOUNTER — Ambulatory Visit (HOSPITAL_COMMUNITY)
Admission: RE | Admit: 2011-09-26 | Discharge: 2011-09-26 | Payer: BC Managed Care – PPO | Source: Ambulatory Visit | Attending: Physical Therapy | Admitting: Physical Therapy

## 2011-09-26 NOTE — Progress Notes (Signed)
Physical Therapy Treatment Patient Details  Name: Angela Norman MRN: [id number] Date of Birth: Mar 19, 1956  Today's Date: 09/26/2011 Time: 4098-1191 Time Calculation (min): 32 min Charges: 32' NMR Visit#: 18  of 22   Re-eval: 10/03/11   Subjective: Symptoms/Limitations Symptoms: Pt reports that she continues to have numbness to her lower leg.  She has been able to return with work with some mild fatigue.   Pain Assessment Currently in Pain?: No/denies  Exercise/Treatments Machines for Strengthening Cybex Knee Extension: Gastroc/Toe raises 20x 2.5PL Standing SLS: 3x30  Seated Other Seated Knee Exercises: Hip Abduction 10x w/blue band; Adduction 10x10 sec hold w/ball; Marching with blue band x10 each side.   Balance Exercises Sidestepping: 2 round trips Tandem Walking: 3 round trips Retro Gait: 3 round trips Balance Master: Limits for Stability: 51 sec w/1 HHA; W/O HHA 4 minutes, unable to comlete last 2 sequences secondary to muscular fatigue.  Used Min A.  Balance Master: Static: 1 min,  Physical Therapy Assessment and Plan PT Assessment and Plan Clinical Impression Statement: Pt continues to be limited by fatigue, however she has made significant improvement in confidence with balance activities.  Continues to have difficulty with dynamic balance activities which leads to her using a cane for night time ambulation.   PT Plan: Progress note next visit.    Goals    Problem List Patient Active Problem List  Diagnoses  . Stiffness of joint, not elsewhere classified, ankle and foot  . Difficulty in walking  . Ankle weakness       Angela Norman 09/26/2011, 6:07 PM

## 2011-10-02 ENCOUNTER — Ambulatory Visit (HOSPITAL_COMMUNITY): Payer: BC Managed Care – PPO

## 2011-10-03 ENCOUNTER — Ambulatory Visit (HOSPITAL_COMMUNITY)
Admission: RE | Admit: 2011-10-03 | Discharge: 2011-10-03 | Disposition: A | Discharge status: Home / Self Care | Payer: BC Managed Care – PPO | Source: Ambulatory Visit | Attending: Physical Therapy | Admitting: Physical Therapy

## 2011-10-03 NOTE — Progress Notes (Signed)
Physical Therapy Treatment/DC note Patient Details  Name: Angela Norman MRN: [id number] Date of Birth: 05/25/1956  Today's Date: 10/03/2011 Time: 4098-1191 Time Calculation (min): 38 min Charges: 23' NMR, 15' Self Care Visit#: 65  of 22   Re-eval:      Subjective: Symptoms/Limitations Symptoms: Pt reports she feels today should be her last day.  She feels her balance is about the same.  Objective: Ambulates outdoors at night with quad cane Walks on the outside of her L foot w/increased supination  Static standing balance with eyes closed has increased toe flexor activation and impaired ankle and hip strategy Increased toe flexor activation with simple toe flexor stretching (unable to fully relax toe flexors) Increased toe activation with inital heel lift, able to maintain toe extension with knee flexion in closed chain position.  Exercise/Treatments Static stance with lateral W/S to improve pronation x3 minutes Static stance w/eyes closed and cueing for even weight distribution 2x2 minutes Attempted Toe Flexor stretch x10 (toe flexors over active) Balance beam x 5 RT with HHA NMR to improve toe extension with heel raise x 5 minutes Education on appropriate exercises to complete at home.  Educated on diabetic neuropathy and her current impairments which are limiting her functional balance.  x15 minutes   Physical Therapy Assessment and Plan PT Assessment and Plan Clinical Impression Statement: Ms. Gritz has attended 74 OPPT visits.  She has met all of her STG, however only 2/4 LTG.  While she has improved with her overall ankle function she continues to have the greatest limitations with overactive toe flexors and is unable to activiate toe extension even with simple activities and is limited because of her diabetic neuropathy.  She continues to ambulate with a SPC during the night time secondary to increased fear of falling.  Pt has not made much progress in her balance  activities secondary to neuropathy and unable to coordinate appropriate toe movment.  She will continue to benefit form a home HEP in order to address balance. PT Plan: D/C with advanced balance HEP.    Goals    Problem List Patient Active Problem List  Diagnoses  . Stiffness of joint, not elsewhere classified, ankle and foot  . Difficulty in walking  . Ankle weakness       Jetty Berland 10/03/2011, 7:00 PM

## 2011-10-19 ENCOUNTER — Ambulatory Visit (HOSPITAL_COMMUNITY)
Admission: RE | Admit: 2011-10-19 | Discharge: 2011-10-19 | Disposition: A | Discharge status: Home / Self Care | Payer: BC Managed Care – PPO | Source: Ambulatory Visit | Attending: Obstetrics and Gynecology | Admitting: Obstetrics and Gynecology

## 2011-10-19 DIAGNOSIS — Z139 Encounter for screening, unspecified: Secondary | ICD-10-CM

## 2011-10-19 DIAGNOSIS — Z1231 Encounter for screening mammogram for malignant neoplasm of breast: Secondary | ICD-10-CM | POA: Insufficient documentation

## 2012-05-21 ENCOUNTER — Ambulatory Visit (HOSPITAL_COMMUNITY)
Admission: RE | Admit: 2012-05-21 | Discharge: 2012-05-21 | Disposition: A | Discharge status: Home / Self Care | Payer: BC Managed Care – PPO | Source: Ambulatory Visit | Attending: Surgery | Admitting: Surgery

## 2012-05-21 DIAGNOSIS — M6281 Muscle weakness (generalized): Secondary | ICD-10-CM | POA: Insufficient documentation

## 2012-05-21 DIAGNOSIS — E1142 Type 2 diabetes mellitus with diabetic polyneuropathy: Secondary | ICD-10-CM | POA: Insufficient documentation

## 2012-05-21 DIAGNOSIS — E1149 Type 2 diabetes mellitus with other diabetic neurological complication: Secondary | ICD-10-CM | POA: Insufficient documentation

## 2012-05-21 DIAGNOSIS — M79609 Pain in unspecified limb: Secondary | ICD-10-CM | POA: Insufficient documentation

## 2012-05-21 DIAGNOSIS — M25676 Stiffness of unspecified foot, not elsewhere classified: Secondary | ICD-10-CM | POA: Insufficient documentation

## 2012-05-21 DIAGNOSIS — M25673 Stiffness of unspecified ankle, not elsewhere classified: Secondary | ICD-10-CM | POA: Insufficient documentation

## 2012-05-21 DIAGNOSIS — IMO0001 Reserved for inherently not codable concepts without codable children: Secondary | ICD-10-CM | POA: Insufficient documentation

## 2012-05-21 DIAGNOSIS — R262 Difficulty in walking, not elsewhere classified: Secondary | ICD-10-CM | POA: Insufficient documentation

## 2012-05-21 DIAGNOSIS — M7918 Myalgia, other site: Secondary | ICD-10-CM | POA: Insufficient documentation

## 2012-05-21 NOTE — Evaluation (Signed)
Physical Therapy Evaluation  Patient Details  Name: Angela Norman MRN: [id number] Date of Birth: Jul 29, 1956  Today's Date: 05/21/2012 Time: 1650-1720 PT Time Calculation (min): 30 min Charges: 1 eval, 1 estim, 10 self care Visit#: 1  of 1     Past Medical History: No past medical history on file. Past Surgical History: No past surgical history on file.  Subjective Symptoms/Limitations Symptoms: PMH: Diabetic neuropathy Pertinent History: Pt is a former patient of this facility s/p Achilles rupture last year.  This time she is referred for BLE pain/discomfort to her calf and foot region.  She reports that she was seeing a reflexologist who suggested to discuss w/her MD about a TENS unit to help with her discomfort and pain in her legs.  Dr. Seward Grater then referred to PT for a 1x/visit for TENS unit set up to decrease discomfort to her LE.  Currently she is taking "particulate" medication to help improve her LE neuropathy. She states that she believes the medicationis working as well as a new diet that Dr. Seward Grater has placed her on.  States her AIC levels are 5.3 and her glucose has stayed about 106, compared to 120 in the past.   Pain Assessment Currently in Pain?: Yes Pain Score:   6 Pain Location: Leg Pain Orientation: Right;Left;Distal;Medial;Lateral Pain Type: Neuropathic pain Pain Onset: More than a month ago  Palpation: Discomfort to B LE gastroc region and bottom of feet.  Pain and discomfort does not increase with palpation.   Exercise/Treatments  Modalities Modalities: Copywriter, advertising Location: TENS Unit to LLE 4 pads to decrease pain and discomfort.  Electrical Stimulation Action: Demonstrated: Constant, Modulated and Burst mod and altered PW and PR measurements.  x15 minutes Electrical Stimulation Goals: Pain  Physical Therapy Assessment and Plan PT Assessment and Plan Clinical Impression Statement: Ms. Wahler was  referred to PT for a 1x/visit for TENS unit trial and set up for B LE pain due to diabetic neuropathy.  Pt was able to demonstrate independence with home TENS unit and is now D/C from PT.   PT Plan: D/C w/home TENS unit    Goals PT Short Term Goals PT Short Term Goal 1: Pt will be independent with a home TENS unit to decrease LE pain.  PT Short Term Goal 1 - Progress: Met  Problem List Patient Active Problem List  Diagnosis  . Stiffness of joint, not elsewhere classified, ankle and foot  . Difficulty in walking  . Ankle weakness  . Musculoskeletal pain    PT Plan of Care PT Patient Instructions: On Home TENS unit.  To call [employer] regarding further information about insurance or the product.  Explained to use on BLE (today only placed on LLE as that is the most painful).  Pt is understanding of rental and purchase agreement.  Consulted and Agree with Plan of Care: Patient  Batoul Limes 05/21/2012, 5:45 PM  Physician Documentation Your signature is required to indicate approval of the treatment plan as stated above.  Please sign and either send electronically or make a copy of this report for your files and return this physician signed original.   Please mark one 1.__approve of plan  2. ___approve of plan with the following conditions.   ______________________________  _____________________ Physician Signature                                                                                                             Date

## 2014-02-23 ENCOUNTER — Other Ambulatory Visit: Payer: Self-pay | Admitting: Obstetrics and Gynecology

## 2014-02-23 DIAGNOSIS — Z1231 Encounter for screening mammogram for malignant neoplasm of breast: Secondary | ICD-10-CM

## 2014-03-01 ENCOUNTER — Ambulatory Visit (HOSPITAL_COMMUNITY)
Admission: RE | Admit: 2014-03-01 | Discharge: 2014-03-01 | Disposition: A | Discharge status: Home / Self Care | Payer: BC Managed Care – PPO | Source: Ambulatory Visit | Attending: Obstetrics and Gynecology | Admitting: Obstetrics and Gynecology

## 2014-03-01 DIAGNOSIS — Z1231 Encounter for screening mammogram for malignant neoplasm of breast: Secondary | ICD-10-CM | POA: Insufficient documentation

## 2014-03-08 ENCOUNTER — Telehealth: Payer: Self-pay | Admitting: Obstetrics and Gynecology

## 2014-03-08 NOTE — Telephone Encounter (Signed)
Confirming pts appt °

## 2014-03-09 NOTE — Telephone Encounter (Signed)
Confirmed.

## 2014-03-12 ENCOUNTER — Encounter: Payer: Self-pay | Admitting: Obstetrics and Gynecology

## 2014-03-12 ENCOUNTER — Ambulatory Visit (INDEPENDENT_AMBULATORY_CARE_PROVIDER_SITE_OTHER): Payer: BC Managed Care – PPO | Admitting: Obstetrics and Gynecology

## 2014-03-12 VITALS — BP 120/70 | HR 70 | Resp 16 | Ht 67.0 in | Wt 265.0 lb

## 2014-03-12 DIAGNOSIS — Z01419 Encounter for gynecological examination (general) (routine) without abnormal findings: Secondary | ICD-10-CM

## 2014-03-12 MED ORDER — NYSTATIN 100000 UNIT/GM EX POWD: Freq: Three times a day (TID) | CUTANEOUS | Status: DC

## 2014-03-12 NOTE — Progress Notes (Signed)
Patient ID: Angela Norman, female   DOB: June 29, 1956, 58 y.o.   MRN: [id number] GYNECOLOGY VISIT  PCP:   Sharilyn Sites, MD  Referring provider:   HPI: 58 y.o.   Married  Caucasian  female   G2P2002 with Patient's last menstrual period was 10/22/2008.   here for  AEX.  No postmenopausal bleeding problems since 2011.  Sees Dr. Velia Meyer for weight struggles.  Frustrated.   Went to [place] in March 2015 to see daughter.   Supposed to avoid aerobic exercise due to neuropathy.   Hgb:    PCP Urine:    GYNECOLOGIC HISTORY: Patient's last menstrual period was 10/22/2008. Sexually active:  No Partner preference: female Contraception:  postmenopausal  Menopausal hormone therapy: no DES exposure:   no Blood transfusions: no   Sexually transmitted diseases:   no GYN procedures and prior surgeries:  D & C and cervical polypectomy  Last mammogram: 03-01-14 wnl:[facility]                Last pap and high risk HPV testing:   2013 wnl History of abnormal pap smear:  no   OB History   Grav Para Term Preterm Abortions TAB SAB Ect Mult Living   2 2 2       2        LIFESTYLE: Exercise:     walking          Tobacco:     no Alcohol:     no Drug use:  no  OTHER HEALTH MAINTENANCE: Tetanus/TDap:   2013 Gardisil:               n/a Influenza:               07/2013, through work Zostavax:              Completed with PCP  Bone density:        never Colonoscopy:        2010 with Dr. Gala Romney in [place].  Next colonosocpy due 2020.  Cholesterol check: wnl with PCP  Family History  Problem Relation Age of Onset  . Diabetes Mother   . Hypertension Mother   . Stroke Mother   . Alzheimer's disease Father   . Hypertension Father   . Hypertension Maternal Grandmother   . Stroke Maternal Grandmother   . Diabetes Maternal Grandfather     Patient Active Problem List   Diagnosis Date Noted  . Musculoskeletal pain 05/21/2012  . Stiffness of joint, not elsewhere  classified, ankle and foot 08/13/2011  . Difficulty in walking 08/13/2011  . Ankle weakness 08/13/2011   Past Medical History  Diagnosis Date  . Anxiety   . Diabetes mellitus without complication     Past Surgical History  Procedure Laterality Date  . Dilation and curettage of uterus  2011    for cervical polyp  . Achilles tendon repair Left   . Bone spur Left     --left foot  . Breast surgery      benign breast bx--left--fatty tissue  . Carpal tunnel release Bilateral     ALLERGIES: Review of patient's allergies indicates not on file.  Current Outpatient Prescriptions  Medication Sig Dispense Refill  . cholecalciferol (VITAMIN D) 1000 UNITS tablet Take 1,000 Units by mouth 3 (three) times daily.      . fexofenadine (ALLEGRA) 180 MG tablet Take 180 mg by mouth daily.      Marland Kitchen gabapentin (NEURONTIN) 600 MG tablet Take 600  mg by mouth as needed. Takes 200mg  qAM, 300mg  mid afternoon, 600mg  qhs      . metolazone (ZAROXOLYN) 5 MG tablet Take 5 mg by mouth as needed.      . NON FORMULARY Take 1 tablet by mouth daily. Carbon 98 BX--a fish oil.  Pt. Takes 5x/week      . OVER THE COUNTER MEDICATION Take 1 tablet by mouth every 3 (three) days. Pyridoxal 1, 2,3--takes 3x weekly      . UNABLE TO FIND Take 1 tablet by mouth. Med Name: Sulfur 24G7--pt. Takes 5xweekly      . vitamin B-12 (CYANOCOBALAMIN) 100 MCG tablet Take 100 mcg by mouth daily.      . vitamin E 100 UNIT capsule Take 100 Units by mouth daily.      . metFORMIN (GLUCOPHAGE) 500 MG tablet Take 500 mg by mouth at bedtime.      Marland Kitchen NASONEX 50 MCG/ACT nasal spray Place 1 spray into the nose every morning.      . pioglitazone (ACTOS) 30 MG tablet Take 30 mg by mouth every morning.       No current facility-administered medications for this visit.     ROS:  Pertinent items are noted in HPI.  SOCIAL HISTORY:  Chiropractor.   PHYSICAL EXAMINATION:    BP 120/70  Pulse 70  Resp 16  Ht 5\' 7"  (1.702 m)  Wt 265 lb (120.203 kg)   BMI 41.50 kg/m2  LMP 10/22/2008   Wt Readings from Last 3 Encounters:  03/12/14 265 lb (120.203 kg)     Ht Readings from Last 3 Encounters:  03/12/14 5\' 7"  (1.702 m)    General appearance: alert, cooperative and appears stated age Head: Normocephalic, without obvious abnormality, atraumatic Neck: no adenopathy, supple, symmetrical, trachea midline and thyroid not enlarged, symmetric, no tenderness/mass/nodules Lungs: clear to auscultation bilaterally Breasts: Inspection negative, No nipple retraction or dimpling, No nipple discharge or bleeding, No axillary or supraclavicular adenopathy, Normal to palpation without dominant masses Heart: regular rate and rhythm Abdomen: pannus, soft, non-tender; no masses,  no organomegaly Extremities: extremities normal, atraumatic, no cyanosis or edema Skin: Skin color, texture, turgor normal.  Erythematous patches of skin under pannus.  Lymph nodes: Cervical, supraclavicular, and axillary nodes normal. No abnormal inguinal nodes palpated Neurologic: Grossly normal  Pelvic: External genitalia:  no lesions              Urethra:  normal appearing urethra with no masses, tenderness or lesions              Bartholins and Skenes: normal                 Vagina: normal appearing vagina with normal color and discharge, no lesions              Cervix: normal appearance              Pap and high risk HPV testing done: yes.            Bimanual Exam:  Uterus:  uterus is normal size, shape, consistency and nontender                                      Adnexa: normal adnexa in size, nontender and no masses  Rectovaginal: Confirms                                      Anus:  normal sphincter tone, no lesions  ASSESSMENT  Normal gynecologic exam. Obesity.  Candida of skin.   PLAN  Mammogram recommended yearly.  Pap smear and high risk HPV testing performed. I discussed weight loss with patient through bariatric  surgery program at [facility].  Counseled on self breast exam, Calcium and vitamin D intake, exercise. Nystatin powder.  Return annually or prn   An After Visit Summary was printed and given to the patient.

## 2014-03-12 NOTE — Patient Instructions (Signed)

## 2014-03-17 LAB — IPS PAP TEST WITH HPV

## 2014-04-28 ENCOUNTER — Other Ambulatory Visit: Payer: Self-pay | Admitting: *Deleted

## 2014-04-28 NOTE — Telephone Encounter (Signed)
Fax From: [facility] for Nystatin Powder Last Refilled: AEX 03/12/14 #15 gm/3 refills  Please advise.

## 2014-05-07 ENCOUNTER — Telehealth: Payer: Self-pay | Admitting: Obstetrics and Gynecology

## 2014-05-07 NOTE — Telephone Encounter (Signed)
LMOM to contact office 

## 2014-05-07 NOTE — Telephone Encounter (Signed)
Pt calling to check on refill of the medication for fungus that Dr Quincy Simmonds prescribed to her. Says her pharmacy contacted our office already. [facility] in [place] at  [phone].

## 2014-05-10 NOTE — Telephone Encounter (Signed)
LMOM to contact office 

## 2014-05-10 NOTE — Telephone Encounter (Signed)
informed pt that (encounter 04/28/14) stated pt needed an appt. Pt agreed and voiced understanding.  Encounter closed

## 2014-08-23 ENCOUNTER — Encounter: Payer: Self-pay | Admitting: Obstetrics and Gynecology

## 2015-01-14 IMAGING — MG MM DIGITAL SCREENING
2 series · 2 of 2 positions shown · non-contrast
Comparison: Previous exam(s)

ACR Breast Density Category a: The breast tissue is almost entirely
fatty.

CLINICAL DATA: Screening.

EXAM:
DIGITAL SCREENING BILATERAL MAMMOGRAM WITH CAD

[L CC]
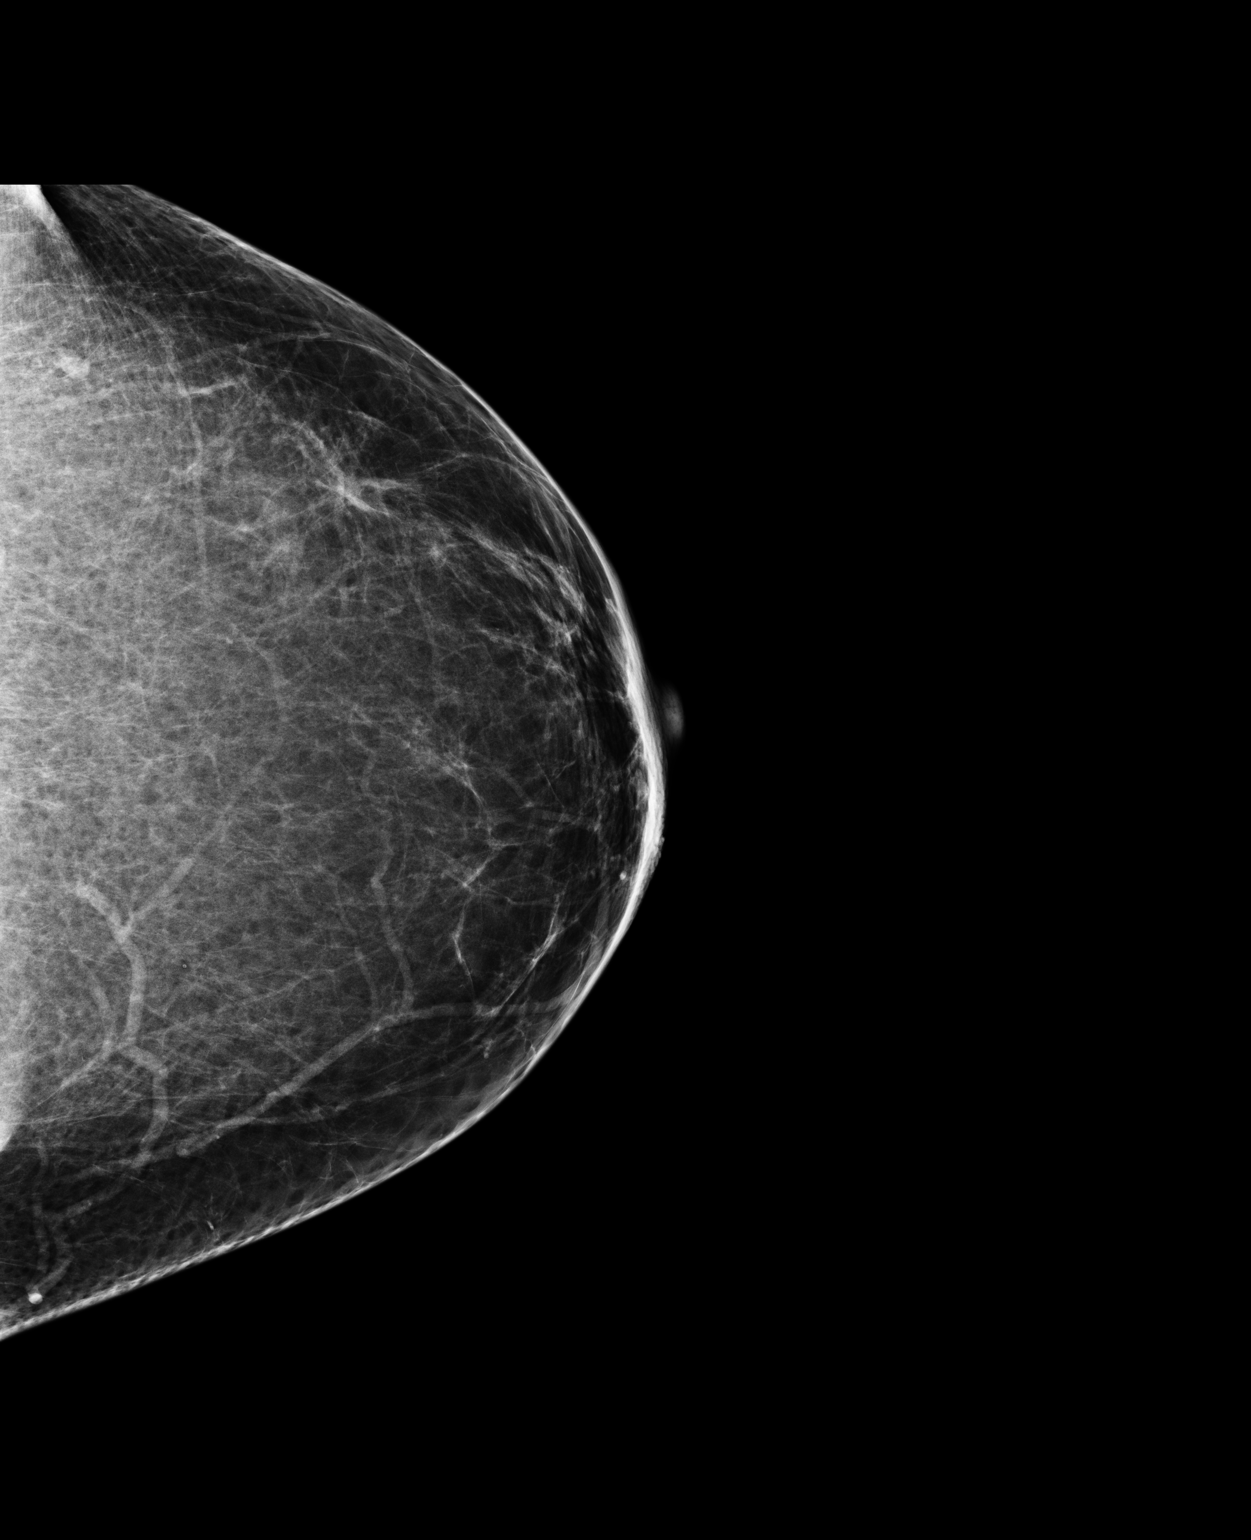

[L MLO]
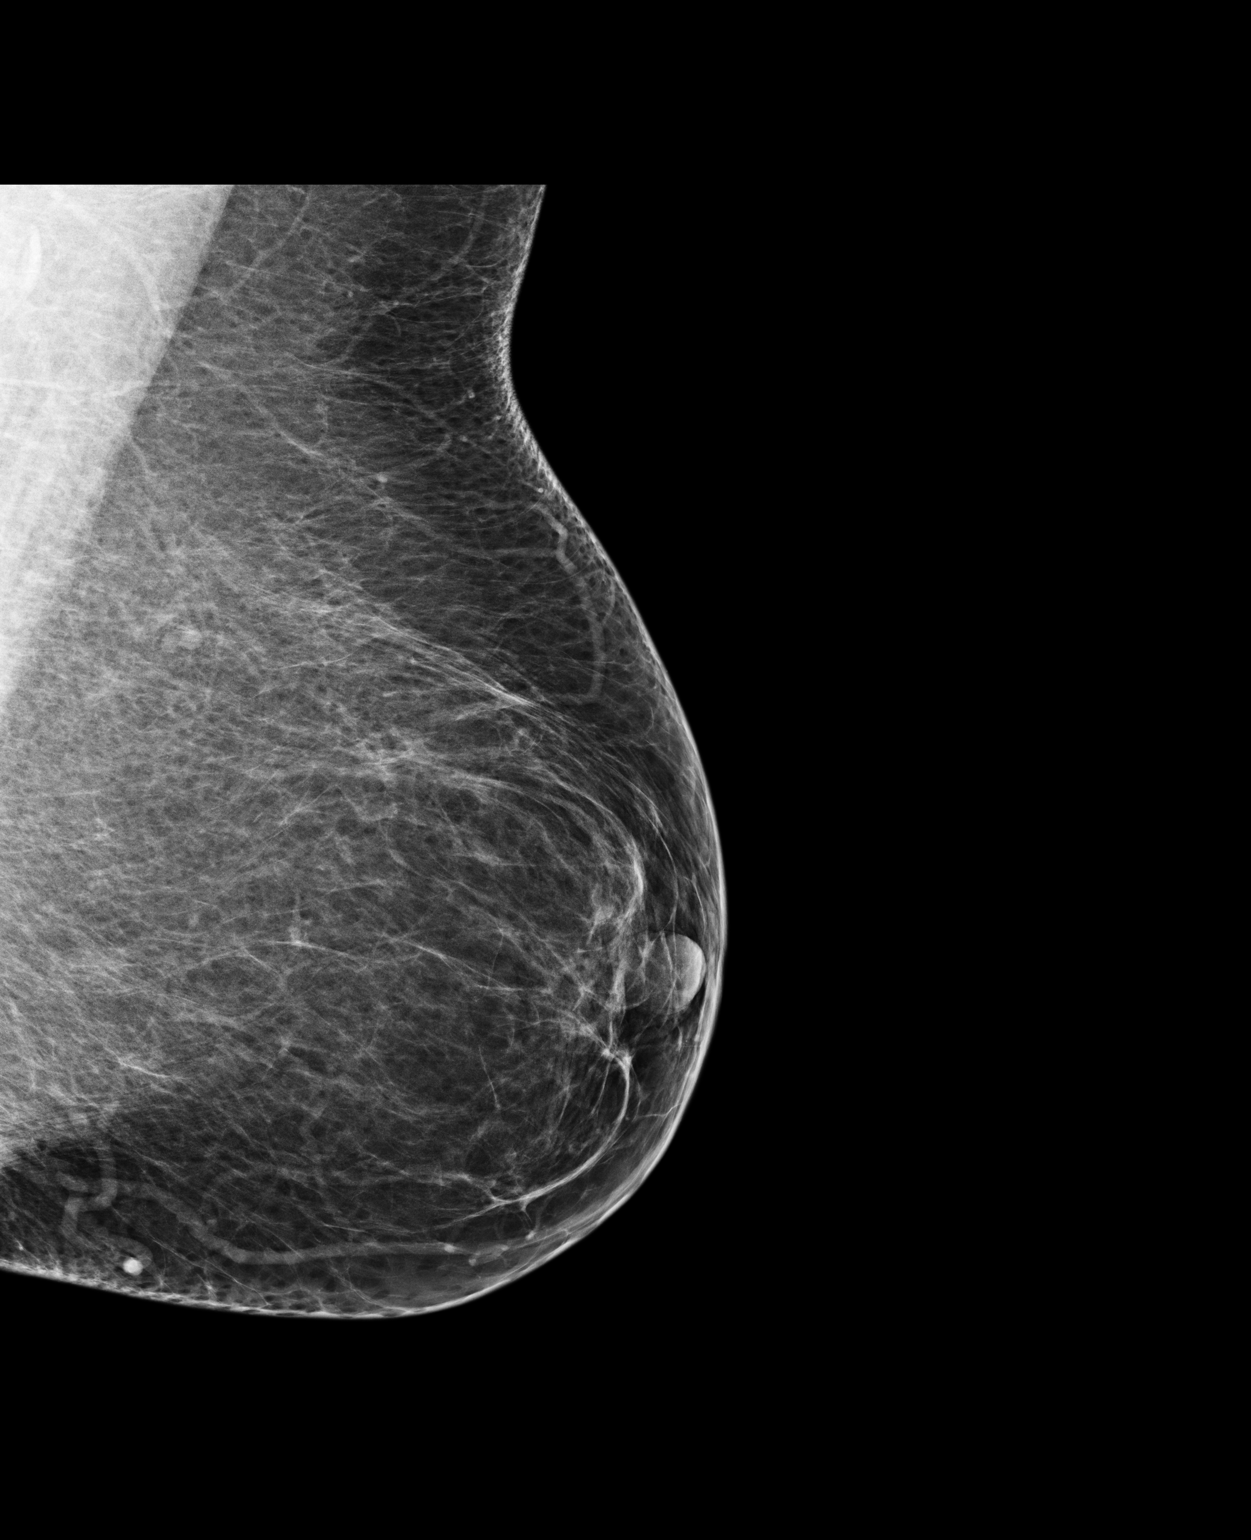

[2 of 2 positions shown; findings below may reference images not displayed]

FINDINGS: There are no findings suspicious for malignancy. Images were
processed with CAD.
IMPRESSION: No mammographic evidence of malignancy. A result letter of this
screening mammogram will be mailed directly to the patient.

RECOMMENDATION:
Screening mammogram in one year. (Code:JV-X-DYE)

BI-RADS CATEGORY  1: Negative.

## 2015-02-28 ENCOUNTER — Ambulatory Visit (INDEPENDENT_AMBULATORY_CARE_PROVIDER_SITE_OTHER): Payer: BC Managed Care – PPO | Admitting: Orthopedic Surgery

## 2015-02-28 ENCOUNTER — Ambulatory Visit (INDEPENDENT_AMBULATORY_CARE_PROVIDER_SITE_OTHER): Payer: BC Managed Care – PPO

## 2015-02-28 ENCOUNTER — Encounter: Payer: Self-pay | Admitting: Orthopedic Surgery

## 2015-02-28 VITALS — BP 129/61 | Ht 67.0 in | Wt 270.0 lb

## 2015-02-28 DIAGNOSIS — M1712 Unilateral primary osteoarthritis, left knee: Secondary | ICD-10-CM | POA: Diagnosis not present

## 2015-02-28 DIAGNOSIS — M25562 Pain in left knee: Secondary | ICD-10-CM | POA: Diagnosis not present

## 2015-02-28 MED ORDER — DICLOFENAC POTASSIUM 50 MG PO TABS: 50.0000 mg | ORAL_TABLET | Freq: Two times a day (BID) | ORAL | Status: DC

## 2015-02-28 NOTE — Progress Notes (Signed)
Patient ID: Angela Norman, female   DOB: February 13, 1956, 59 y.o.   MRN: [id number]  Chief Complaint  Patient presents with  . Knee Pain    Left knee pain, no injury. Referred by Dr. Caprice Beaver for consult and treat.     Angela Norman is a 59 y.o. female.   HPI Today we have a 59 year old female complains of pain in her left knee and aching in her left leg. She attributes to aching to her ongoing chronic neuropathy which requires AFO bracing. She complains of burning aching pain on the medial aspect of the knee with swelling and stiffness which she rates 4 out of 10 in varies but worse with walking. She's tried Aleve and ibuprofen ice and heat. She is increased leg aching with sitting Review of Systems She has reported to [place] that she does have redness in her eyes at times she has irritable bowel syndrome so a time she may have constipation diarrhea and nausea  She has ongoing neuropathy causing numbness and tingling burning pain in especially the left leg somewhat in the right shoulder has seasonal allergies  She ruptured her Achilles tendon 3-1/2 years ago she has fibromyalgia which leads to swollen and stiff joints walking difficulties muscle weakness and joint pain although the systems reviewed were negative  Past Medical History  Diagnosis Date  . Anxiety   . Diabetes mellitus without complication     Past Surgical History  Procedure Laterality Date  . Dilation and curettage of uterus  2011    for cervical polyp  . Achilles tendon repair Left   . Bone spur Left     --left foot  . Breast surgery      benign breast bx--left--fatty tissue  . Carpal tunnel release Bilateral     Family History  Problem Relation Age of Onset  . Diabetes Mother   . Hypertension Mother   . Stroke Mother   . Alzheimer's disease Father   . Hypertension Father   . Hypertension Maternal Grandmother   . Stroke Maternal Grandmother   . Diabetes Maternal Grandfather   . Other Mother    myeloproliferative disorder    Social History History  Substance Use Topics  . Smoking status: Never Smoker   . Smokeless tobacco: Not on file  . Alcohol Use: No    No Known Allergies  Current Outpatient Prescriptions  Medication Sig Dispense Refill  . cholecalciferol (VITAMIN D) 1000 UNITS tablet Take 1,000 Units by mouth 3 (three) times daily.    . diclofenac (CATAFLAM) 50 MG tablet Take 1 tablet (50 mg total) by mouth 2 (two) times daily. 90 tablet 3  . fexofenadine (ALLEGRA) 180 MG tablet Take 180 mg by mouth daily.    Angela Norman Kitchen gabapentin (NEURONTIN) 600 MG tablet Take 600 mg by mouth as needed. Takes 200mg  qAM, 300mg  mid afternoon, 600mg  qhs    . metFORMIN (GLUCOPHAGE) 500 MG tablet Take 500 mg by mouth at bedtime.    . metolazone (ZAROXOLYN) 5 MG tablet Take 5 mg by mouth as needed.    Angela Norman Kitchen NASONEX 50 MCG/ACT nasal spray Place 1 spray into the nose every morning.    . NON FORMULARY Take 1 tablet by mouth daily. Carbon 98 BX--a fish oil.  Pt. Takes 5x/week    . nystatin (MYCOSTATIN) powder Apply topically 3 (three) times daily. Apply to affected area for up to 7 days 15 g 3  . OVER THE COUNTER MEDICATION Take 1 tablet by mouth every 3 (three)  days. Pyridoxal 1, 2,3--takes 3x weekly    . pioglitazone (ACTOS) 30 MG tablet Take 30 mg by mouth every morning.    Angela Norman Kitchen UNABLE TO FIND Take 1 tablet by mouth. Med Name: Sulfur 24G7--pt. Takes 5xweekly    . vitamin B-12 (CYANOCOBALAMIN) 100 MCG tablet Take 100 mcg by mouth daily.    . vitamin E 100 UNIT capsule Take 100 Units by mouth daily.     No current facility-administered medications for this visit.       Physical Exam Blood pressure 129/61, height 5\' 7"  (1.702 m), weight 270 lb (122.471 kg), last menstrual period 10/22/2008. Physical Exam The patient is well developed well nourished and well groomed. Orientation to person place and time is normal  Mood is pleasant. Ambulatory status she does use assistive device of an AFO and a cane We  do find that she has medial joint line tenderness on the left no joint effusion her knee flexion is approximately 115 her knee is stable in the coronal and sagittal plane were normal muscle tone and strength in the quadriceps skin is intact without laceration ulceration or skin change in color or neuropathy is present with weakness and sensory changes in what appears to be L5 distribution vascularity of the limb is normal  Opposite knee shows medial joint line tenderness as well.  Data Reviewed Are intraoffice x-rays show osteoarthritis of the knee moderate  Assessment Recommend starting osteoarthritis medication and cortisone injection Plan Meds ordered this encounter  Medications  . diclofenac (CATAFLAM) 50 MG tablet    Sig: Take 1 tablet (50 mg total) by mouth 2 (two) times daily.    Dispense:  90 tablet    Refill:  3    Procedure note left knee injection verbal consent was obtained to inject left knee joint  Timeout was completed to confirm the site of injection  The medications used were 40 mg of Depo-Medrol and 1% lidocaine 3 cc  Anesthesia was provided by ethyl chloride and the skin was prepped with alcohol.  After cleaning the skin with alcohol a 20-gauge needle was used to inject the left knee joint. There were no complications. A sterile bandage was applied.

## 2015-03-23 ENCOUNTER — Other Ambulatory Visit: Payer: Self-pay | Admitting: Obstetrics and Gynecology

## 2015-03-23 ENCOUNTER — Other Ambulatory Visit (HOSPITAL_COMMUNITY): Payer: Self-pay | Admitting: Obstetrics and Gynecology

## 2015-03-23 DIAGNOSIS — Z1231 Encounter for screening mammogram for malignant neoplasm of breast: Secondary | ICD-10-CM

## 2015-04-18 ENCOUNTER — Other Ambulatory Visit: Payer: Self-pay | Admitting: Obstetrics and Gynecology

## 2015-04-18 ENCOUNTER — Ambulatory Visit (HOSPITAL_COMMUNITY)
Admission: RE | Admit: 2015-04-18 | Discharge: 2015-04-18 | Disposition: A | Discharge status: Home / Self Care | Payer: BC Managed Care – PPO | Source: Ambulatory Visit | Attending: Obstetrics and Gynecology | Admitting: Obstetrics and Gynecology

## 2015-04-18 DIAGNOSIS — Z1231 Encounter for screening mammogram for malignant neoplasm of breast: Secondary | ICD-10-CM | POA: Diagnosis present

## 2016-07-31 ENCOUNTER — Other Ambulatory Visit: Payer: Self-pay | Admitting: Obstetrics and Gynecology

## 2016-07-31 DIAGNOSIS — Z1231 Encounter for screening mammogram for malignant neoplasm of breast: Secondary | ICD-10-CM

## 2016-08-02 ENCOUNTER — Ambulatory Visit (HOSPITAL_COMMUNITY)
Admission: RE | Admit: 2016-08-02 | Discharge: 2016-08-02 | Disposition: A | Discharge status: Home / Self Care | Payer: BC Managed Care – PPO | Source: Ambulatory Visit | Attending: Obstetrics and Gynecology | Admitting: Obstetrics and Gynecology

## 2016-08-02 DIAGNOSIS — Z1231 Encounter for screening mammogram for malignant neoplasm of breast: Secondary | ICD-10-CM | POA: Diagnosis present

## 2016-08-09 ENCOUNTER — Ambulatory Visit: Payer: BC Managed Care – PPO | Admitting: Obstetrics and Gynecology

## 2017-11-07 ENCOUNTER — Ambulatory Visit (HOSPITAL_COMMUNITY)
Admission: RE | Admit: 2017-11-07 | Discharge: 2017-11-07 | Disposition: A | Discharge status: Home / Self Care | Payer: BC Managed Care – PPO | Source: Ambulatory Visit | Attending: Podiatry | Admitting: Podiatry

## 2017-11-07 ENCOUNTER — Other Ambulatory Visit (HOSPITAL_COMMUNITY): Payer: Self-pay | Admitting: Podiatry

## 2017-11-07 DIAGNOSIS — M7989 Other specified soft tissue disorders: Secondary | ICD-10-CM | POA: Insufficient documentation

## 2017-11-07 DIAGNOSIS — M79661 Pain in right lower leg: Secondary | ICD-10-CM | POA: Insufficient documentation

## 2017-11-11 ENCOUNTER — Other Ambulatory Visit (HOSPITAL_COMMUNITY): Payer: Self-pay | Admitting: Podiatry

## 2017-11-11 DIAGNOSIS — L97522 Non-pressure chronic ulcer of other part of left foot with fat layer exposed: Secondary | ICD-10-CM

## 2017-11-11 DIAGNOSIS — L03116 Cellulitis of left lower limb: Secondary | ICD-10-CM

## 2017-11-14 ENCOUNTER — Ambulatory Visit (HOSPITAL_COMMUNITY)
Admission: RE | Admit: 2017-11-14 | Discharge: 2017-11-14 | Disposition: A | Discharge status: Home / Self Care | Payer: BC Managed Care – PPO | Source: Ambulatory Visit | Attending: Podiatry | Admitting: Podiatry

## 2017-11-14 DIAGNOSIS — L97522 Non-pressure chronic ulcer of other part of left foot with fat layer exposed: Secondary | ICD-10-CM

## 2017-11-14 DIAGNOSIS — L03116 Cellulitis of left lower limb: Secondary | ICD-10-CM | POA: Diagnosis present

## 2017-11-14 LAB — POCT I-STAT CREATININE: Creatinine, Ser: 1 mg/dL (ref 0.44–1.00)

## 2017-11-14 MED ORDER — GADOBENATE DIMEGLUMINE 529 MG/ML IV SOLN
20.0000 mL | Freq: Once | INTRAVENOUS | Status: AC | PRN
  Administered 2017-11-14: 20 mL via INTRAVENOUS

## 2018-01-03 ENCOUNTER — Encounter: Payer: Self-pay | Admitting: Obstetrics and Gynecology

## 2018-01-03 ENCOUNTER — Other Ambulatory Visit: Payer: Self-pay | Admitting: Obstetrics and Gynecology

## 2018-01-03 DIAGNOSIS — Z1231 Encounter for screening mammogram for malignant neoplasm of breast: Secondary | ICD-10-CM

## 2018-01-08 ENCOUNTER — Ambulatory Visit (HOSPITAL_COMMUNITY)
Admission: RE | Admit: 2018-01-08 | Discharge: 2018-01-08 | Disposition: A | Discharge status: Home / Self Care | Payer: BC Managed Care – PPO | Source: Ambulatory Visit | Attending: Obstetrics and Gynecology | Admitting: Obstetrics and Gynecology

## 2018-01-08 ENCOUNTER — Encounter (HOSPITAL_COMMUNITY): Payer: Self-pay

## 2018-01-08 DIAGNOSIS — Z1231 Encounter for screening mammogram for malignant neoplasm of breast: Secondary | ICD-10-CM | POA: Diagnosis present

## 2018-03-22 HISTORY — PX: FOOT SURGERY: SHX648

## 2018-03-24 ENCOUNTER — Other Ambulatory Visit: Payer: Self-pay

## 2018-03-24 ENCOUNTER — Encounter: Payer: Self-pay | Admitting: Obstetrics and Gynecology

## 2018-03-24 ENCOUNTER — Other Ambulatory Visit (HOSPITAL_COMMUNITY)
Admission: RE | Admit: 2018-03-24 | Discharge: 2018-03-24 | Disposition: A | Discharge status: Home / Self Care | Payer: BC Managed Care – PPO | Source: Ambulatory Visit | Attending: Obstetrics and Gynecology | Admitting: Obstetrics and Gynecology

## 2018-03-24 ENCOUNTER — Ambulatory Visit (INDEPENDENT_AMBULATORY_CARE_PROVIDER_SITE_OTHER): Payer: BC Managed Care – PPO | Admitting: Obstetrics and Gynecology

## 2018-03-24 VITALS — BP 130/70 | HR 72 | Resp 16 | Ht 66.25 in | Wt 248.0 lb

## 2018-03-24 DIAGNOSIS — Z780 Asymptomatic menopausal state: Secondary | ICD-10-CM | POA: Diagnosis not present

## 2018-03-24 DIAGNOSIS — Z01419 Encounter for gynecological examination (general) (routine) without abnormal findings: Secondary | ICD-10-CM | POA: Insufficient documentation

## 2018-03-24 NOTE — Progress Notes (Signed)
62 y.o. G18P2002 Married Caucasian female here for annual exam.    No vaginal bleeding or spotting.  No bladder control issues.  Has a lot of GI upset in the am.  Has fecal urgency.  Hx IBS.   Noctural neg cramps.   Having foot surgery on left foot.   A1C - 5.3 - 5.5.  PCP: Dr. Mingo Amber   Patient's last menstrual period was 10/22/2008.           Sexually active: No.  The current method of family planning is post menopausal status.    Exercising: No.  Exercise is limited by Charcot's joint of the foot due to neropathy. Smoker:  no  Health Maintenance:  Pap:  03/12/14 Pap and HR HPV negative History of abnormal Pap:  no MMG:  01/08/18 BIRADS 1 negative/density b Colonoscopy:  2010 - Dr. Buford Dresser.  Due in 2020. BMD:   n/a  Result  n/a TDaP:  UTD per patient  Gardasil:   n/a HIV: never Hep C: never Screening Labs: PCP    reports that she has never smoked. She has never used smokeless tobacco. She reports that she does not drink alcohol or use drugs.  Past Medical History:  Diagnosis Date  . Anxiety   . Arthritis   . Basal cell carcinoma   . Charcot's joint of foot    bilateral  . Diabetes mellitus without complication ([place])   . Fibromyalgia   . Neuropathy     Past Surgical History:  Procedure Laterality Date  . ACHILLES TENDON REPAIR Left   . bone spur Left    --left foot  . BREAST SURGERY     benign breast bx--left--fatty tissue  . CARPAL TUNNEL RELEASE Bilateral   . DILATION AND CURETTAGE OF UTERUS  2011   for cervical polyp    Current Outpatient Medications  Medication Sig Dispense Refill  . cholecalciferol (VITAMIN D) 1000 UNITS tablet Take 1,000 Units by mouth 3 (three) times daily.    . fexofenadine (ALLEGRA) 180 MG tablet Take 180 mg by mouth daily.    Marland Kitchen gabapentin (NEURONTIN) 600 MG tablet Take 600 mg by mouth as needed. Takes 200mg  qAM, 300mg  mid afternoon, 600mg  qhs    . metFORMIN (GLUCOPHAGE) 500 MG tablet Take 500 mg by mouth at bedtime.     . metolazone (ZAROXOLYN) 5 MG tablet Take 5 mg by mouth as needed.    Marland Kitchen NASONEX 50 MCG/ACT nasal spray Place 1 spray into the nose as needed.     . NON FORMULARY Take 1 tablet by mouth daily. Carbon 98 BX--a fish oil.  Pt. Takes 5x/week    . OVER THE COUNTER MEDICATION Take 1 tablet by mouth every 3 (three) days. Chromic Chloride - po qd    . vitamin E 100 UNIT capsule Take 100 Units by mouth daily.    . diclofenac (CATAFLAM) 50 MG tablet Take 1 tablet (50 mg total) by mouth 2 (two) times daily. (Patient not taking: Reported on 03/24/2018) 90 tablet 3  . vitamin B-12 (CYANOCOBALAMIN) 100 MCG tablet Take 100 mcg by mouth daily.     No current facility-administered medications for this visit.     Family History  Problem Relation Age of Onset  . Diabetes Mother   . Hypertension Mother   . Stroke Mother   . Other Mother        myeloproliferative disorder  . Alzheimer's disease Father   . Hypertension Father   . Hypertension Maternal Grandmother   .  Stroke Maternal Grandmother   . Diabetes Maternal Grandfather     Review of Systems  Constitutional: Negative.   HENT: Negative.   Eyes: Negative.   Respiratory: Negative.   Cardiovascular: Negative.   Gastrointestinal: Negative.   Endocrine: Negative.   Genitourinary: Negative.   Musculoskeletal: Negative.   Skin: Negative.   Allergic/Immunologic: Negative.   Neurological: Negative.   Hematological: Negative.   Psychiatric/Behavioral: Negative.     Exam:   BP 130/70 (BP Location: Right Arm, Patient Position: Sitting, Cuff Size: Large)   Pulse 72   Resp 16   Ht 5' 6.25" (1.683 m)   Wt 248 lb (112.5 kg)   LMP 10/22/2008   BMI 39.73 kg/m     General appearance: alert, cooperative and appears stated age Head: Normocephalic, without obvious abnormality, atraumatic Neck: no adenopathy, supple, symmetrical, trachea midline and thyroid normal to inspection and palpation Lungs: clear to auscultation bilaterally Breasts: normal  appearance, no masses or tenderness, No nipple retraction or dimpling, No nipple discharge or bleeding, No axillary or supraclavicular adenopathy Heart: regular rate and rhythm Abdomen: soft, non-tender; no masses, no organomegaly Extremities: extremities normal, atraumatic, no cyanosis or edema Skin: Skin color, texture, turgor normal. No rashes or lesions Lymph nodes: Cervical, supraclavicular, and axillary nodes normal. No abnormal inguinal nodes palpated Neurologic: Grossly normal  Pelvic: External genitalia:  no lesions              Urethra:  normal appearing urethra with no masses, tenderness or lesions              Bartholins and Skenes: normal                 Vagina: normal appearing vagina with normal color and discharge, no lesions              Cervix: no lesions              Pap taken: Yes.   Bimanual Exam:  Uterus:  normal size, contour, position, consistency, mobility, non-tender              Adnexa: no mass, fullness, tenderness              Rectal exam: Yes.  .  Confirms.              Anus:  normal sphincter tone, no lesions  Chaperone was present for exam.  Assessment:   Well woman visit with normal exam. Menopause.  Nocturnal leg cramps.   Plan: Mammogram screening. Recommended self breast awareness. Pap and HR HPV as above. Guidelines for Calcium, Vitamin D, regular exercise program including cardiovascular and weight bearing exercise. BMD ordered for [facility].  She will call to schedule.  We reviewed leg cramps in Up to Date and recommended stretching, iv t E and vit B complex tx.  Follow up annually and prn.   After visit summary provided.

## 2018-03-24 NOTE — Patient Instructions (Signed)

## 2018-03-27 LAB — CYTOLOGY - PAP
DIAGNOSIS: NEGATIVE
HPV: NOT DETECTED

## 2018-06-11 ENCOUNTER — Ambulatory Visit (HOSPITAL_COMMUNITY)
Admission: RE | Admit: 2018-06-11 | Discharge: 2018-06-11 | Disposition: A | Discharge status: Home / Self Care | Payer: BC Managed Care – PPO | Source: Ambulatory Visit | Attending: Obstetrics and Gynecology | Admitting: Obstetrics and Gynecology

## 2018-06-11 DIAGNOSIS — Z780 Asymptomatic menopausal state: Secondary | ICD-10-CM | POA: Diagnosis present

## 2018-06-25 ENCOUNTER — Other Ambulatory Visit (HOSPITAL_BASED_OUTPATIENT_CLINIC_OR_DEPARTMENT_OTHER): Payer: Self-pay

## 2018-06-25 DIAGNOSIS — G4733 Obstructive sleep apnea (adult) (pediatric): Secondary | ICD-10-CM

## 2018-06-25 DIAGNOSIS — R5383 Other fatigue: Secondary | ICD-10-CM

## 2018-06-25 DIAGNOSIS — R0683 Snoring: Secondary | ICD-10-CM

## 2018-07-18 ENCOUNTER — Ambulatory Visit: Payer: BC Managed Care – PPO | Attending: Surgery | Admitting: Neurology

## 2018-07-18 DIAGNOSIS — Z79899 Other long term (current) drug therapy: Secondary | ICD-10-CM | POA: Insufficient documentation

## 2018-07-18 DIAGNOSIS — G4733 Obstructive sleep apnea (adult) (pediatric): Secondary | ICD-10-CM | POA: Diagnosis not present

## 2018-07-18 DIAGNOSIS — R0683 Snoring: Secondary | ICD-10-CM

## 2018-07-18 DIAGNOSIS — Z7984 Long term (current) use of oral hypoglycemic drugs: Secondary | ICD-10-CM | POA: Diagnosis not present

## 2018-07-18 DIAGNOSIS — R5383 Other fatigue: Secondary | ICD-10-CM | POA: Insufficient documentation

## 2018-08-06 NOTE — Procedures (Signed)
[place] A. Merlene Laughter, MD     [email or web address]             NOCTURNAL POLYSOMNOGRAPHY   LOCATION: [facility]   Patient Name: Angela Norman, Angela Norman Date: 07/18/2018 Gender: Female D.O.B: 04/03/1956 Age (years): 63 Referring Provider: Mingo Amber MD Height (inches): 67 Interpreting Physician: Phillips Odor MD, ABSM Weight (lbs): 235 RPSGT: Peak, Robert BMI: 37 MRN: [id number] Neck Size: 17.00 CLINICAL INFORMATION The patient is referred for a CPAP titration to treat sleep apnea.     Date of NPSG, Split Night or HST:  SLEEP STUDY TECHNIQUE As per the AASM Manual for the Scoring of Sleep and Associated Events v2.3 (April 2016) with a hypopnea requiring 4% desaturations.  The channels recorded and monitored were frontal, central and occipital EEG, electrooculogram (EOG), submentalis EMG (chin), nasal and oral airflow, thoracic and abdominal wall motion, anterior tibialis EMG, snore microphone, electrocardiogram, and pulse oximetry. Continuous positive airway pressure (CPAP) was initiated at the beginning of the study and titrated to treat sleep-disordered breathing.  MEDICATIONS Medications self-administered by patient taken the night of the study : N/A  Current Outpatient Medications:  .  cholecalciferol (VITAMIN D) 1000 UNITS tablet, Take 1,000 Units by mouth 3 (three) times daily., Disp: , Rfl:  .  diclofenac (CATAFLAM) 50 MG tablet, Take 1 tablet (50 mg total) by mouth 2 (two) times daily. (Patient not taking: Reported on 03/24/2018), Disp: 90 tablet, Rfl: 3 .  fexofenadine (ALLEGRA) 180 MG tablet, Take 180 mg by mouth daily., Disp: , Rfl:  .  gabapentin (NEURONTIN) 600 MG tablet, Take 600 mg by mouth as needed. Takes 200mg  qAM, 300mg  mid afternoon, 600mg  qhs, Disp: , Rfl:  .  metFORMIN (GLUCOPHAGE) 500 MG tablet, Take 500 mg by mouth at bedtime., Disp: , Rfl:  .  metolazone (ZAROXOLYN) 5 MG tablet, Take 5 mg by mouth as needed., Disp: ,  Rfl:  .  NASONEX 50 MCG/ACT nasal spray, Place 1 spray into the nose as needed. , Disp: , Rfl:  .  NON FORMULARY, Take 1 tablet by mouth daily. Carbon 98 BX--a fish oil.  Pt. Takes 5x/week, Disp: , Rfl:  .  OVER THE COUNTER MEDICATION, Take 1 tablet by mouth every 3 (three) days. Chromic Chloride - po qd, Disp: , Rfl:  .  vitamin B-12 (CYANOCOBALAMIN) 100 MCG tablet, Take 100 mcg by mouth daily., Disp: , Rfl:  .  vitamin E 100 UNIT capsule, Take 100 Units by mouth daily., Disp: , Rfl:    TECHNICIAN COMMENTS Comments added by technician: PATIENT COMES TO SLEEP CENTER FOR CPAP RETITRATION. PATIENT RESPONDED WELL TO CPAP PRESSURE. MASK WAS CHANGED FROM A NASAL MASK TO FULL FACE MASK UPON PATIENT'S REQUEST DUE TO VERY DRY MOUTH. ALL STAGES OF SLEEP WERE OBSERVED. SUPINE AND LATERAL POSITIONS WERE NOTED. AIRWAY OBSTRUCTIONS AND SNORING WERE ELIMINATED WITH CPAP PRESSURE. LEG MOVEMENTS WERE OBSERVED OCCASIONALLY. ECG SHOWED NORMAL SINUS RHYTHM. Comments added by scorer: N/A RESPIRATORY PARAMETERS Optimal PAP Pressure (cm): 9 AHI at Optimal Pressure (/hr): 0.0 Overall Minimal O2 (%): 89.0 Supine % at Optimal Pressure (%): 0 Minimal O2 at Optimal Pressure (%): 94.0   SLEEP ARCHITECTURE The study was initiated at 10:22:28 PM and ended at 5:00:03 AM.  Sleep onset time was 5.8 minutes and the sleep efficiency was 89.3%%. The total sleep time was 355 minutes.  The patient spent 10.0%% of the night in stage N1 sleep, 70.7%% in stage N2 sleep, 1.0%% in stage N3 and 18.3% in REM.Stage  REM latency was 119.5 minutes  Wake after sleep onset was 36.8. Alpha intrusion was absent. Supine sleep was 0.00%.  CARDIAC DATA The 2 lead EKG demonstrated sinus rhythm. The mean heart rate was 62.9 beats per minute. Other EKG findings include: None.  LEG MOVEMENT DATA The total Periodic Limb Movements of Sleep (PLMS) were 0. The PLMS index was 0.0. A PLMS index of <15 is considered normal in adults.  IMPRESSIONS The  optimal CPAP is 9 cm of water.  Delano Metz, MD Diplomate, American Board of Sleep Medicine.  ELECTRONICALLY SIGNED ON:  08/06/2018, 9:22 AM [place] PH: [phone]   FX: [phone] [place]

## 2019-03-11 ENCOUNTER — Other Ambulatory Visit (HOSPITAL_COMMUNITY): Payer: Self-pay | Admitting: Family Medicine

## 2019-03-11 ENCOUNTER — Other Ambulatory Visit (HOSPITAL_COMMUNITY): Payer: Self-pay | Admitting: Internal Medicine

## 2019-03-11 DIAGNOSIS — Z1231 Encounter for screening mammogram for malignant neoplasm of breast: Secondary | ICD-10-CM

## 2019-04-06 ENCOUNTER — Ambulatory Visit: Payer: BC Managed Care – PPO | Admitting: Orthopedic Surgery

## 2019-04-06 ENCOUNTER — Telehealth: Payer: Self-pay | Admitting: Orthopedic Surgery

## 2019-04-06 ENCOUNTER — Ambulatory Visit (INDEPENDENT_AMBULATORY_CARE_PROVIDER_SITE_OTHER): Payer: BC Managed Care – PPO

## 2019-04-06 ENCOUNTER — Encounter: Payer: Self-pay | Admitting: Orthopedic Surgery

## 2019-04-06 ENCOUNTER — Other Ambulatory Visit: Payer: Self-pay

## 2019-04-06 VITALS — BP 156/74 | HR 67 | Ht 67.0 in | Wt 250.0 lb

## 2019-04-06 DIAGNOSIS — M25561 Pain in right knee: Secondary | ICD-10-CM | POA: Diagnosis not present

## 2019-04-06 DIAGNOSIS — M7051 Other bursitis of knee, right knee: Secondary | ICD-10-CM

## 2019-04-06 DIAGNOSIS — M7121 Synovial cyst of popliteal space [Baker], right knee: Secondary | ICD-10-CM | POA: Diagnosis not present

## 2019-04-06 NOTE — Patient Instructions (Signed)
Pes Anserine Bursitis    The pes anserine is an area on the inside of your knee, just below the joint, that is cushioned by a fluid-filled sac (bursa). Pes anserine bursitis is a condition that happens when the bursa gets swollen and irritated. The condition causes knee pain.  What are the causes?  This condition may be caused by:  · Making the same movement over and over.  · A direct hit (trauma) to the inside of the leg.  What increases the risk?  You are more likely to develop this condition if you:  · Are a runner.  · Play sports that involve a lot of running and quick side-to-side movements (cutting).  · Are an athlete who plays contact sports.  · Swim using an inward angle of the knee, such as with the breaststroke.  · Have tight hamstring muscles.  · Are a woman.  · Are overweight.  · Have flat feet.  · Have diabetes or osteoarthritis.  What are the signs or symptoms?  Symptoms of this condition include:  · Knee pain that gets better with rest and worse with activities like climbing stairs, walking, running, or getting in and out of a chair.  · Swelling.  · Warmth.  · Tenderness when pressing at the inside of the lower leg, just below the knee.  How is this diagnosed?  This condition may be diagnosed based on:  · Your symptoms.  · Your medical history.  · A physical exam.  ? During your physical exam, your health care provider will press on the tendon attachment to see if you feel pain.  ? Your health care provider may also check your hip and knee motion and strength.  · Tests to check for swelling and fluid buildup in the bursa and to look at muscles, bones, and tendons. These tests might include:  ? X-rays.  ? MRI.  ? Ultrasound.  How is this treated?  This condition may be treated by:  · Resting your knee. You may be told to raise (elevate) your knee while resting.  · Avoiding activities that cause pain.  · Icing the inside of your knee.  · Sleeping with a pillow between your knees. This will cushion your  injured knee.  · Taking medicine by mouth (orally) to reduce pain and swelling or having medicine injected into your knee.  · Doing strengthening and stretching exercises (physical therapy).  If these treatments do not work or if the condition keeps coming back, you may need to have surgery to remove the bursa.  Follow these instructions at home:  Managing pain, stiffness, and swelling    · If directed, put ice on the injured area.  ? Put ice in a plastic bag.  ? Place a towel between your skin and the bag.  ? Leave the ice on for 20 minutes, 2-3 times a day.  · Elevate the injured area above the level of your heart while you are sitting or lying down.  Activity  · Return to your normal activities as told by your health care provider. Ask your health care provider what activities are safe for you.  · Do exercises as told by your health care provider.  General instructions  · Take over-the-counter and prescription medicines only as told by your health care provider.  · Sleep with a pillow between your knees.  · Do not use any products that contain nicotine or tobacco, such as cigarettes, e-cigarettes, and chewing tobacco. These   can delay healing. If you need help quitting, ask your health care provider.  · If you are overweight, work with your health care provider and a dietitian to set a weight-loss goal that is healthy and reasonable for you.  · Keep all follow-up visits as told by your health care provider. This is important.  How is this prevented?  · When exercising, make sure that you:  ? Warm up and stretch before being active.  ? Cool down and stretch after being active.  ? Give your body time to rest between periods of activity.  ? Use equipment that fits you.  ? Are safe and responsible while being active to avoid falls.  ? Do at least 150 minutes of moderate-intensity exercise each week, such as brisk walking or water aerobics.  ? Maintain physical fitness,  including:  § Strength.  § Flexibility.  § Cardiovascular fitness.  § Endurance.  ? Maintain a healthy weight.  Contact a health care provider if:  · Your symptoms do not improve.  · Your symptoms get worse.  Summary  · Pes anserine bursitis is a condition that happens when the fluid-filled sac (bursa) at the inside of your knee gets swollen and irritated. The condition causes knee pain.  · Treatment for pes anserine bursitis may include resting your knee, icing the inside of your knee, sleeping with a pillow between your knees, taking medicine by mouth or by injection, and doing strengthening and stretching exercises (physical therapy).  · Follow instructions for managing pain, stiffness, and swelling.  · Take over-the-counter and prescription medicines only as told by your health care provider.  This information is not intended to replace advice given to you by your health care provider. Make sure you discuss any questions you have with your health care provider.  Document Released: 10/08/2005 Document Revised: 03/19/2018 Document Reviewed: 03/19/2018  Elsevier Interactive Patient Education © 2019 Elsevier Inc.

## 2019-04-06 NOTE — Progress Notes (Signed)
NEW PROBLEM OFFICE VISIT  Chief Complaint  Patient presents with  . Knee Pain    behind right knee and below knee cap since march, worse past week     63 year old female comes in with a 2-week history of pain in the back of her knee which then extended to the anteromedial aspect of the knee.  She was getting up from a seated position felt pain behind the calf that subsequently peaked and started to resolve but now she has pain on the anteromedial aspect of the knee with some swelling.  No loss of motion catching locking or giving way no history of trauma.  Anteromedial knee pain is mild aching and more of a discomfort   Review of Systems  Musculoskeletal: Positive for myalgias.  Neurological: Positive for sensory change.     Past Medical History:  Diagnosis Date  . Anxiety   . Arthritis   . Basal cell carcinoma   . Charcot's joint of foot    bilateral  . Diabetes mellitus without complication ([place])   . Fibromyalgia   . Neuropathy     Past Surgical History:  Procedure Laterality Date  . ACHILLES TENDON REPAIR Left   . bone spur Left    --left foot  . BREAST SURGERY     benign breast bx--left--fatty tissue  . CARPAL TUNNEL RELEASE Bilateral   . DILATION AND CURETTAGE OF UTERUS  2011   for cervical polyp    Family History  Problem Relation Age of Onset  . Diabetes Mother   . Hypertension Mother   . Stroke Mother   . Other Mother        myeloproliferative disorder  . Alzheimer's disease Father   . Hypertension Father   . Hypertension Maternal Grandmother   . Stroke Maternal Grandmother   . Diabetes Maternal Grandfather    Social History   Tobacco Use  . Smoking status: Never Smoker  . Smokeless tobacco: Never Used  Substance Use Topics  . Alcohol use: No  . Drug use: No    No Known Allergies  Current Meds  Medication Sig  . cholecalciferol (VITAMIN D) 1000 UNITS tablet Take 1,000 Units by mouth 3 (three) times daily.  . diclofenac (CATAFLAM) 50 MG  tablet Take 1 tablet (50 mg total) by mouth 2 (two) times daily.  Marland Kitchen gabapentin (NEURONTIN) 600 MG tablet Take 600 mg by mouth as needed. Takes 200mg  qAM, 300mg  mid afternoon, 600mg  qhs  . metFORMIN (GLUCOPHAGE) 500 MG tablet Take 500 mg by mouth at bedtime.  . metolazone (ZAROXOLYN) 5 MG tablet Take 5 mg by mouth as needed.  . NON FORMULARY Take 1 tablet by mouth daily. Carbon 98 BX--a fish oil.  Pt. Takes 5x/week  . OVER THE COUNTER MEDICATION Take 1 tablet by mouth every 3 (three) days. Chromic Chloride - po qd  . vitamin E 100 UNIT capsule Take 100 Units by mouth daily.    BP (!) 156/74   Pulse 67   Ht 5\' 7"  (1.702 m)   Wt 250 lb (113.4 kg)   LMP 10/22/2008   BMI 39.16 kg/m   Physical Exam Vitals signs and nursing note reviewed.  Constitutional:      Appearance: Normal appearance.  Musculoskeletal:     Right knee: She exhibits effusion.  Neurological:     Mental Status: She is alert and oriented to person, place, and time.  Psychiatric:        Mood and Affect: Mood normal.  Right Knee Exam   Muscle Strength  The patient has normal right knee strength.  Tenderness  The patient is experiencing tenderness in the pes anserinus (Lateral calf and popliteal fossa).  Range of Motion  Extension: normal  Flexion: normal   Tests  McMurray:  Medial - negative Lateral - negative Varus: negative Valgus: negative Drawer:  Anterior - negative    Posterior - negative  Other  Erythema: absent Scars: absent Swelling: none Effusion: effusion present   Left Knee Exam   Muscle Strength  The patient has normal left knee strength.  Tenderness  The patient is experiencing no tenderness.   Range of Motion  Extension: normal  Flexion: normal   Tests  McMurray:  Medial - negative Lateral - negative Varus: negative Valgus: negative Drawer:  Anterior - negative     Posterior - negative  Other  Erythema: absent Scars: absent Swelling: none        MEDICAL  DECISION SECTION  Xrays were done at [facility]  My independent reading of xrays:  No significant abnormalities in terms of the joint maybe has an effusion  Encounter Diagnoses  Name Primary?  . Pes anserinus bursitis of right knee Yes  . Baker's cyst of knee, right     PLAN: (Rx., injectx, surgery, frx, mri/ct) Procedure note right knee injection for bursitis   verbal consent was obtained to inject right knee PES BURSA  Timeout was completed to confirm the site of injection  The medications used were 40 mg of Depo-Medrol and 1% lidocaine 3 cc  Anesthesia was provided by ethyl chloride and the skin was prepped with alcohol.  After cleaning the skin with alcohol a 25-gauge needle was used to inject the right knee bursa.  There were no complications and a sterile bandage was applied   No orders of the defined types were placed in this encounter.  Follow-up as needed  Arther Abbott, MD  04/06/2019 4:01 PM

## 2019-04-06 NOTE — Telephone Encounter (Signed)
sure

## 2019-04-06 NOTE — Telephone Encounter (Signed)
Patient has question that she thought of at [place] - asking if she is not up to participating in graduation ceremony at [employer] next month, due to knee, may she be given a note to excuse her?

## 2019-04-07 NOTE — Telephone Encounter (Signed)
Called back to patient to notify. States she will let us know if she needs by graduation date in July.

## 2019-09-01 ENCOUNTER — Other Ambulatory Visit (HOSPITAL_COMMUNITY): Payer: Self-pay | Admitting: Obstetrics and Gynecology

## 2019-09-01 DIAGNOSIS — Z1231 Encounter for screening mammogram for malignant neoplasm of breast: Secondary | ICD-10-CM

## 2019-09-09 ENCOUNTER — Ambulatory Visit (HOSPITAL_COMMUNITY)
Admission: RE | Admit: 2019-09-09 | Discharge: 2019-09-09 | Disposition: A | Discharge status: Home / Self Care | Payer: BC Managed Care – PPO | Source: Ambulatory Visit | Attending: Obstetrics and Gynecology | Admitting: Obstetrics and Gynecology

## 2019-09-09 ENCOUNTER — Other Ambulatory Visit: Payer: Self-pay

## 2019-09-09 DIAGNOSIS — Z1231 Encounter for screening mammogram for malignant neoplasm of breast: Secondary | ICD-10-CM | POA: Diagnosis present

## 2019-09-22 ENCOUNTER — Encounter: Payer: Self-pay | Admitting: Obstetrics and Gynecology

## 2019-09-22 ENCOUNTER — Ambulatory Visit: Payer: BC Managed Care – PPO | Admitting: Obstetrics and Gynecology

## 2019-09-22 ENCOUNTER — Other Ambulatory Visit: Payer: Self-pay

## 2019-09-22 VITALS — BP 130/66 | HR 76 | Temp 97.0°F | Resp 18 | Ht 66.0 in | Wt 260.0 lb

## 2019-09-22 DIAGNOSIS — Z01419 Encounter for gynecological examination (general) (routine) without abnormal findings: Secondary | ICD-10-CM | POA: Diagnosis not present

## 2019-09-22 MED ORDER — NYSTATIN 100000 UNIT/GM EX POWD: Freq: Three times a day (TID) | CUTANEOUS | 2 refills | Status: DC

## 2019-09-22 NOTE — Progress Notes (Signed)
63 y.o. G53P2002 Married Caucasian female here for annual exam.    No vaginal bleeding.  She has a hx of IBS.  States she had some LLQ pain one month ago.  She is using Citrocel, which is helpful.   She has some vulvar lumps that can bleed at times.  Had left foot surgery.   Stress due to Covid. Feels she is managing ok now.  She works at a [employer] and was working from home previously.   She sees Dr. Jaynie Collins for blood work.  She does virtual visits as Dr. Jaynie Collins practices in [place].   A1C is 5.8, which is slightly increased for her.  PCP:  Allyn Kenner, MD   Patient's last menstrual period was 10/22/2008.           Sexually active: No.  The current method of family planning is post menopausal status.    Exercising: Yes.    Is trying to walk daily at work Smoker:  no  Health Maintenance: Pap:03-24-18 Neg:Neg HR HPV, 03-12-14 Neg:Neg HR HPV History of abnormal Pap:  no MMG: 09-09-19 3D/Neg/density B/BiRads1. Colonoscopy:2010 - Dr. Buford Dresser.  Due in 2020 but hasn't scheduled. BMD: 06-11-18 Result : Normal TDaP: up to date with PCP Gardasil:   no [id number] Hep C:never Screening Labs:  PCP.  Flu vaccine:  Completed.    reports that she has never smoked. She has never used smokeless tobacco. She reports that she does not drink alcohol or use drugs.  Past Medical History:  Diagnosis Date  . Anxiety   . Arthritis   . Basal cell carcinoma   . Charcot's joint of foot    bilateral  . Diabetes mellitus without complication ([place])   . Fibromyalgia   . Neuropathy     Past Surgical History:  Procedure Laterality Date  . ACHILLES TENDON REPAIR Left   . bone spur Left    --left foot  . BREAST SURGERY     benign breast bx--left--fatty tissue  . CARPAL TUNNEL RELEASE Bilateral   . DILATION AND CURETTAGE OF UTERUS  2011   for cervical polyp  . FOOT SURGERY Left 03/2018   reconstructive surgery    Current Outpatient Medications  Medication Sig Dispense Refill  .  cholecalciferol (VITAMIN D) 1000 UNITS tablet Take 1,000 Units by mouth 3 (three) times daily.    Marland Kitchen gabapentin (NEURONTIN) 600 MG tablet Take 600 mg by mouth as needed. Takes 200mg  qAM, 300mg  mid afternoon, 600mg  qhs    . metFORMIN (GLUCOPHAGE) 500 MG tablet Take 500 mg by mouth at bedtime.    . metolazone (ZAROXOLYN) 5 MG tablet Take 5 mg by mouth as needed.    Marland Kitchen NASONEX 50 MCG/ACT nasal spray Place 1 spray into the nose as needed.     . NON FORMULARY Take 1 tablet by mouth daily. Carbon 98 BX--a fish oil.  Pt. Takes 5x/week    . OVER THE COUNTER MEDICATION Take 1 tablet by mouth every 3 (three) days. Chromic Chloride - po qd    . vitamin B-12 (CYANOCOBALAMIN) 100 MCG tablet Take 100 mcg by mouth daily.    . vitamin E 100 UNIT capsule Take 100 Units by mouth daily.     No current facility-administered medications for this visit.     Family History  Problem Relation Age of Onset  . Diabetes Mother   . Hypertension Mother   . Stroke Mother   . Other Mother        myeloproliferative disorder  .  Alzheimer's disease Father   . Hypertension Father   . Hypertension Maternal Grandmother   . Stroke Maternal Grandmother   . Diabetes Maternal Grandfather     Review of Systems  All other systems reviewed and are negative.   Exam:   BP 130/66 (Cuff Size: Large)   Pulse 76   Temp (!) 97 F (36.1 C) (Temporal)   Resp 18   Ht 5\' 6"  (1.676 m)   Wt 260 lb (117.9 kg)   LMP 10/22/2008   BMI 41.97 kg/m     General appearance: alert, cooperative and appears stated age Head: normocephalic, without obvious abnormality, atraumatic Neck: no adenopathy, supple, symmetrical, trachea midline and thyroid normal to inspection and palpation Lungs: clear to auscultation bilaterally Breasts: normal appearance, no masses or tenderness, No nipple retraction or dimpling, No nipple discharge or bleeding, No axillary adenopathy Heart: regular rate and rhythm Abdomen: obese, pannus, soft, non-tender; no  masses, no organomegaly Extremities: extremities normal, atraumatic, no cyanosis or edema Skin: skin color, texture, turgor normal. Minor erythema and fissure of skin under left pannus. Lymph nodes: cervical, supraclavicular, and axillary nodes normal. Neurologic: grossly normal  Pelvic: External genitalia:  no lesions.  Few hair follicles with minor erythema.              No abnormal inguinal nodes palpated.              Urethra:  normal appearing urethra with no masses, tenderness or lesions              Bartholins and Skenes: normal                 Vagina: normal appearing vagina with normal color and discharge, no lesions              Cervix: no lesions              Pap taken: No. Bimanual Exam:  Uterus:  normal size, contour, position, consistency, mobility, non-tender              Adnexa: no mass, fullness, tenderness              Rectal exam: Yes.  .  Confirms.              Anus:  normal sphincter tone, no lesions  Chaperone was present for exam.  Assessment:   Well woman visit with normal exam. Obesity.  Candida of flexural folds.  Plan: Mammogram screening discussed. Self breast awareness reviewed. Pap and HR HPV as above. Guidelines for Calcium, Vitamin D, regular exercise program including cardiovascular and weight bearing exercise. Nystatin powder.  Instructed in use.  We discussed weight gain as reason for her elevated A1C.  I did discuss her age, diabetes and obesity as risk factors for Covid 45 and I recommended she discuss working from home with her supervisor.   Follow up annually and prn.   After visit summary provided.

## 2019-09-22 NOTE — Patient Instructions (Signed)

## 2020-01-04 ENCOUNTER — Encounter: Payer: Self-pay | Admitting: Internal Medicine

## 2020-08-09 ENCOUNTER — Other Ambulatory Visit (HOSPITAL_COMMUNITY): Payer: Self-pay | Admitting: Obstetrics and Gynecology

## 2020-08-09 DIAGNOSIS — Z1231 Encounter for screening mammogram for malignant neoplasm of breast: Secondary | ICD-10-CM

## 2020-09-12 ENCOUNTER — Ambulatory Visit (HOSPITAL_COMMUNITY): Payer: BC Managed Care – PPO

## 2020-09-12 NOTE — Progress Notes (Signed)
64 y.o. G36P2002 Married Caucasian female here for annual exam.     Denies vaginal bleeding.  No vaginal discharge.  Using Citrucel for bowel regulation.   Does labs through [facility] and Dr. Jaynie Collins reviews them.  She is doing virtual visits with her.  A1C is 6.2.  Patient had COVID vaccine and booster--Moderna. Received her flu vaccine.   PCP: Lucienne Capers, MD  Patient's last menstrual period was 10/22/2008.           Sexually active: No.  The current method of family planning is post menopausal status.    Exercising: Yes.    walking, dance videos and some light weights Smoker:  no  Health Maintenance: Pap: 03-24-18 Neg:Neg HR HPV, 03-12-14 Neg:Neg HR HPV History of abnormal Pap:  no MMG: 09-09-19 3D/Neg/density B/Birads1.  She will do in January.  Colonoscopy:  01-27-10 normal Dr.Roark;next due now BMD: 06-11-18  Result :Normal TDaP: pt. Thinks due 2022 or 2023 Gardasil:   no HIV: never Hep C:never Screening Labs:  Dr. Jaynie Collins.   reports that she has never smoked. She has never used smokeless tobacco. She reports that she does not drink alcohol and does not use drugs.  Past Medical History:  Diagnosis Date  . Anxiety   . Arthritis   . Basal cell carcinoma   . Charcot's joint of foot    bilateral  . Diabetes mellitus without complication ([place])   . Fibromyalgia   . Neuropathy     Past Surgical History:  Procedure Laterality Date  . ACHILLES TENDON REPAIR Left   . bone spur Left    --left foot  . BREAST SURGERY     benign breast bx--left--fatty tissue  . CARPAL TUNNEL RELEASE Bilateral   . DILATION AND CURETTAGE OF UTERUS  2011   for cervical polyp  . FOOT SURGERY Left 03/2018   reconstructive surgery    Current Outpatient Medications  Medication Sig Dispense Refill  . cholecalciferol (VITAMIN D) 1000 UNITS tablet Take 1,000 Units by mouth 3 (three) times daily.    Marland Kitchen gabapentin (NEURONTIN) 600 MG tablet Take 600 mg by mouth as needed. Takes 200mg  qAM, 300mg  mid  afternoon, 600mg  qhs    . metFORMIN (GLUCOPHAGE) 500 MG tablet Take 500 mg by mouth at bedtime.    . metolazone (ZAROXOLYN) 5 MG tablet Take 5 mg by mouth as needed.    . NON FORMULARY Take 1 tablet by mouth daily. Carbon 98 BX--a fish oil.  Pt. Takes 5x/week    . nystatin (MYCOSTATIN/NYSTOP) powder Apply topically 3 (three) times daily. Apply to affected area for up to 7 days 45 g 2  . OVER THE COUNTER MEDICATION Take 1 tablet by mouth every 3 (three) days. Chromic Chloride - po qd    . vitamin E 100 UNIT capsule Take 100 Units by mouth daily.     No current facility-administered medications for this visit.    Family History  Problem Relation Age of Onset  . Diabetes Mother   . Hypertension Mother   . Stroke Mother   . Other Mother        myeloproliferative disorder  . Alzheimer's disease Father   . Hypertension Father   . Hypertension Maternal Grandmother   . Stroke Maternal Grandmother   . Diabetes Maternal Grandfather     Review of Systems  All other systems reviewed and are negative.   Exam:   BP 130/68 (Cuff Size: Large)   Pulse 78   Ht 5\' 6"  (  1.676 m)   Wt 260 lb (117.9 kg)   LMP 10/22/2008   SpO2 98%   BMI 41.97 kg/m     General appearance: alert, cooperative and appears stated age Head: normocephalic, without obvious abnormality, atraumatic Neck: no adenopathy, supple, symmetrical, trachea midline and thyroid normal to inspection and palpation Lungs: clear to auscultation bilaterally Breasts: normal appearance, no masses or tenderness, No nipple retraction or dimpling, No nipple discharge or bleeding, No axillary adenopathy Heart: regular rate and rhythm Abdomen: soft, non-tender; no masses, no organomegaly Extremities: extremities normal, atraumatic, no cyanosis or edema Skin: skin color, texture, turgor normal. No rashes or lesions Lymph nodes: cervical, supraclavicular, and axillary nodes normal. Neurologic: grossly normal  Pelvic: External genitalia:   Erythema of the inguinal folds.              No abnormal inguinal nodes palpated.              Urethra:  normal appearing urethra with no masses, tenderness or lesions              Bartholins and Skenes: normal                 Vagina: normal appearing vagina with normal color and discharge, no lesions              Cervix: no lesions              Pap taken: No. Bimanual Exam:  Uterus:  normal size, contour, position, consistency, mobility, non-tender              Adnexa: no mass, fullness, tenderness              Rectal exam: Yes.  .  Confirms.              Anus:  normal sphincter tone, no lesions  Chaperone was present for exam.  Assessment:   Well woman visit with normal exam. Candida of flexural folds.   Plan: Mammogram screening discussed. Self breast awareness reviewed. Pap and HR HPV as above. Guidelines for Calcium, Vitamin D, regular exercise program including cardiovascular and weight bearing exercise. Refill of Nystatin powder.  Follow up annually and prn.

## 2020-09-22 ENCOUNTER — Ambulatory Visit (INDEPENDENT_AMBULATORY_CARE_PROVIDER_SITE_OTHER): Payer: BC Managed Care – PPO | Admitting: Obstetrics and Gynecology

## 2020-09-22 ENCOUNTER — Encounter: Payer: Self-pay | Admitting: Obstetrics and Gynecology

## 2020-09-22 ENCOUNTER — Other Ambulatory Visit: Payer: Self-pay

## 2020-09-22 VITALS — BP 130/68 | HR 78 | Ht 66.0 in | Wt 260.0 lb

## 2020-09-22 DIAGNOSIS — Z01419 Encounter for gynecological examination (general) (routine) without abnormal findings: Secondary | ICD-10-CM

## 2020-09-22 MED ORDER — NYSTATIN 100000 UNIT/GM EX POWD: Freq: Three times a day (TID) | CUTANEOUS | 2 refills | Status: DC

## 2020-09-22 NOTE — Patient Instructions (Signed)

## 2020-10-28 ENCOUNTER — Other Ambulatory Visit: Payer: Self-pay

## 2020-10-31 ENCOUNTER — Other Ambulatory Visit: Payer: Self-pay

## 2020-10-31 ENCOUNTER — Ambulatory Visit (HOSPITAL_COMMUNITY)
Admission: RE | Admit: 2020-10-31 | Discharge: 2020-10-31 | Disposition: A | Discharge status: Home / Self Care | Payer: BC Managed Care – PPO | Source: Ambulatory Visit | Attending: Obstetrics and Gynecology | Admitting: Obstetrics and Gynecology

## 2020-10-31 DIAGNOSIS — Z1231 Encounter for screening mammogram for malignant neoplasm of breast: Secondary | ICD-10-CM | POA: Diagnosis present

## 2020-12-15 ENCOUNTER — Other Ambulatory Visit: Payer: Self-pay

## 2020-12-15 ENCOUNTER — Ambulatory Visit: Payer: BC Managed Care – PPO

## 2020-12-15 ENCOUNTER — Ambulatory Visit: Payer: BC Managed Care – PPO | Admitting: Orthopedic Surgery

## 2020-12-15 ENCOUNTER — Encounter: Payer: Self-pay | Admitting: Orthopedic Surgery

## 2020-12-15 VITALS — BP 160/79 | HR 74 | Ht 66.0 in | Wt 265.0 lb

## 2020-12-15 DIAGNOSIS — M79641 Pain in right hand: Secondary | ICD-10-CM

## 2020-12-15 DIAGNOSIS — Z6841 Body Mass Index (BMI) 40.0 and over, adult: Secondary | ICD-10-CM

## 2020-12-15 MED ORDER — MELOXICAM 7.5 MG PO TABS: 7.5000 mg | ORAL_TABLET | Freq: Every day | ORAL | 5 refills | Status: DC

## 2020-12-15 NOTE — Progress Notes (Signed)
ESTABLISHED NEW PROBLEM  OFFICE VISIT   Encounter Diagnosis  Name Primary?  . Pain in right hand Yes    65 YO F WORKS AT [employer]   COMES IN TODAY FOR PAIN AND SWELLING DORSUM RIGHT HAND WITH PAIN LOCALIZED TO THE MP JOINTS AND THE WRIST   BRACE SEEMED TO HELP   SEEMS TO GET WORSE DEPENDING ON ACTIVITY   (and prior treatment)  Past Medical History:  Diagnosis Date  . Anxiety   . Arthritis   . Basal cell carcinoma   . Charcot's joint of foot    bilateral  . Diabetes mellitus without complication ([place])   . Fibromyalgia   . Neuropathy     Current Outpatient Medications:  .  cholecalciferol (VITAMIN D) 1000 UNITS tablet, Take 1,000 Units by mouth 3 (three) times daily., Disp: , Rfl:  .  gabapentin (NEURONTIN) 600 MG tablet, Take 600 mg by mouth as needed. Takes 200mg  qAM, 300mg  mid afternoon, 600mg  qhs, Disp: , Rfl:  .  metFORMIN (GLUCOPHAGE) 500 MG tablet, Take 500 mg by mouth at bedtime., Disp: , Rfl:  .  metolazone (ZAROXOLYN) 5 MG tablet, Take 5 mg by mouth as needed., Disp: , Rfl:  .  NON FORMULARY, Take 1 tablet by mouth daily. Carbon 98 BX--a fish oil.  Pt. Takes 5x/week, Disp: , Rfl:  .  NON FORMULARY, Sulfur 24G7   Formula A16B  [place], Disp: , Rfl:  .  nystatin (MYCOSTATIN/NYSTOP) powder, Apply topically 3 (three) times daily. Apply to affected area for up to 7 days, Disp: 45 g, Rfl: 2 .  OVER THE COUNTER MEDICATION, Take 1 tablet by mouth every 3 (three) days. Chromic Chloride - po qd, Disp: , Rfl:  .  vitamin E 100 UNIT capsule, Take 100 Units by mouth daily., Disp: , Rfl:   Physical Exam Constitutional:      General: She is not in acute distress.    Appearance: She is well-developed.     Comments: Well developed, well nourished Normal grooming and hygiene     Cardiovascular:     Comments: No peripheral edema Musculoskeletal:     Right wrist: Tenderness present. No swelling, deformity, effusion, lacerations, snuff box tenderness or crepitus.  Normal range of motion. Normal pulse.     Right hand: Swelling and tenderness present. No deformity or lacerations. Decreased range of motion. Normal strength. Normal sensation. There is no disruption of two-point discrimination. Normal capillary refill. Normal pulse.     Comments: SWELLING MPJ OF THE HAND   TENDERNESS WRIST JOINT   Skin:    General: Skin is warm and dry.  Neurological:     Mental Status: She is alert and oriented to person, place, and time.     Sensory: No sensory deficit.     Coordination: Coordination normal.     Gait: Gait normal.     Deep Tendon Reflexes: Reflexes are normal and symmetric.  Psychiatric:        Mood and Affect: Mood normal.        Behavior: Behavior normal.        Thought Content: Thought content normal.        Judgment: Judgment normal.     Comments: Affect normal     BP (!) 160/79   Pulse 74   Ht 5\' 6"  (1.676 m)   Wt 265 lb (120.2 kg)   LMP 10/22/2008   BMI 42.77 kg/m  The patient meets the AMA guidelines for  Morbid (severe) obesity with a BMI > 40.0 and I have recommended weight loss.   ASSESSMENT AND PLAN Encounter Diagnoses  Name Primary?  . Pain in right hand Yes  . Body mass index 40.0-44.9, adult ([place])   . Morbid obesity (HCC)     SYNOVITIS VS EARLY OA   USE TOPICAL MEDICATIONS AS NEEDED  AVOID ACTIVITIES THAT INCREASE PAIN  USE ERGONOMIC KEYBOARD START MOBIC 1 TAB DAILY  WEAR BRACE AS NEEDED

## 2020-12-15 NOTE — Patient Instructions (Addendum)
USE TOPICAL MEDICATIONS AS NEEDED  AVOID ACTIVITIES THAT INCREASE PAIN  USE ERGONOMIC KEYBOARD START MOBIC 1 TAB DAILY  WEAR BRACE AS NEEDED     BMI for Adults What is BMI? Body mass index (BMI) is a number that is calculated from a person's weight and height. BMI can help estimate how much of a person's weight is composed of fat. BMI does not measure body fat directly. Rather, it is an alternative to procedures that directly measure body fat, which can be difficult and expensive. BMI can help identify people who may be at higher risk for certain medical problems. What are BMI measurements used for? BMI is used as a screening tool to identify possible weight problems. It helps determine whether a person is obese, overweight, a healthy weight, or underweight. BMI is useful for:  Identifying a weight problem that may be related to a medical condition or may increase the risk for medical problems.  Promoting changes, such as changes in diet and exercise, to help reach a healthy weight. BMI screening can be repeated to see if these changes are working. How is BMI calculated? BMI involves measuring your weight in relation to your height. Both height and weight are measured, and the BMI is calculated from those numbers. This can be done either in [place] ([place] or metric measurements. Note that charts and online BMI calculators are available to help you find your BMI quickly and easily without having to do these calculations yourself. To calculate your BMI in English (U.S.) measurements: 1. Measure your weight in pounds (lb). 2. Multiply the number of pounds by 703. ? For example, for a person who weighs 180 lb, multiply that number by 703, which equals 126,540. 3. Measure your height in inches. Then multiply that number by itself to get a measurement called "inches squared." ? For example, for a person who is 70 inches tall, the "inches squared" measurement is 70 inches x 70 inches, which equals  4,900 inches squared. 4. Divide the total from step 2 (number of lb x 703) by the total from step 3 (inches squared): 126,540  4,900 = 25.8. This is your BMI.   To calculate your BMI in metric measurements: 1. Measure your weight in kilograms (kg). 2. Measure your height in meters (m). Then multiply that number by itself to get a measurement called "meters squared." ? For example, for a person who is 1.75 m tall, the "meters squared" measurement is 1.75 m x 1.75 m, which is equal to 3.1 meters squared. 3. Divide the number of kilograms (your weight) by the meters squared number. In this example: 70  3.1 = 22.6. This is your BMI. What do the results mean? BMI charts are used to identify whether you are underweight, normal weight, overweight, or obese. The following guidelines will be used:  Underweight: BMI less than 18.5.  Normal weight: BMI between 18.5 and 24.9.  Overweight: BMI between 25 and 29.9.  Obese: BMI of 30 or above. Keep these notes in mind:  Weight includes both fat and muscle, so someone with a muscular build, such as an athlete, may have a BMI that is higher than 24.9. In cases like these, BMI is not an accurate measure of body fat.  To determine if excess body fat is the cause of a BMI of 25 or higher, further assessments may need to be done by a health care provider.  BMI is usually interpreted in the same way for men and women. Where  to find more information For more information about BMI, including tools to quickly calculate your BMI, go to these websites:  Centers for Disease Control and Prevention: http://www.wolf.info/  American Heart Association: www.heart.org  National Heart, Lung, and Blood Institute: https://wilson-eaton.com/ Summary  Body mass index (BMI) is a number that is calculated from a person's weight and height.  BMI may help estimate how much of a person's weight is composed of fat. BMI can help identify those who may be at higher risk for certain medical  problems.  BMI can be measured using English measurements or metric measurements.  BMI charts are used to identify whether you are underweight, normal weight, overweight, or obese. This information is not intended to replace advice given to you by your health care provider. Make sure you discuss any questions you have with your health care provider. Document Revised: 07/01/2019 Document Reviewed: 05/08/2019 Elsevier Patient Education  [place] Pain Many things can cause hand pain. Some common causes are:  An injury.  Repeating the same movement with your hand over and over (overuse).  Osteoporosis.  Arthritis.  Lumps in the tendons or joints of the hand and wrist (ganglion cysts).  Nerve compression syndromes (carpal tunnel syndrome).  Inflammation of the tendons (tendinitis).  Infection. Follow these instructions at home: Pay attention to any changes in your symptoms. Take these actions to help with your discomfort: Managing pain, stiffness, and swelling  Take over-the-counter and prescription medicines only as told by your health care provider.  Wear a hand splint or support as told by your health care provider.  If directed, put ice on the affected area: ? Put ice in a plastic bag. ? Place a towel between your skin and the bag. ? Leave the ice on for 20 minutes, 2-3 times a day.   Activity  Take breaks from repetitive activity often.  Avoid activities that make your pain worse.  Minimize stress on your hands and wrists as much as possible.  Do stretches or exercises as told by your health care provider.  Do not do activities that make your pain worse. Contact a health care provider if:  Your pain does not get better after a few days of self-care.  Your pain gets worse.  Your pain affects your ability to do your daily activities. Get help right away if:  Your hand becomes warm, red, or swollen.  Your hand is numb or tingling.  Your  hand is extremely swollen or deformed.  Your hand or fingers turn white or blue.  You cannot move your hand, wrist, or fingers. Summary  Many things can cause hand pain.  Contact your health care provider if your pain does not get better after a few days of self care.  Minimize stress on your hands and wrists as much as possible.  Do not do activities that make your pain worse. This information is not intended to replace advice given to you by your health care provider. Make sure you discuss any questions you have with your health care provider. Document Revised: 07/04/2018 Document Reviewed: 07/04/2018 Elsevier Patient Education  2021 [employer].

## 2021-04-28 ENCOUNTER — Other Ambulatory Visit (HOSPITAL_COMMUNITY): Payer: Self-pay | Admitting: Student

## 2021-04-28 DIAGNOSIS — R519 Headache, unspecified: Secondary | ICD-10-CM

## 2021-05-10 ENCOUNTER — Encounter (HOSPITAL_COMMUNITY): Payer: Self-pay

## 2021-05-10 ENCOUNTER — Ambulatory Visit (HOSPITAL_COMMUNITY)
Admission: RE | Admit: 2021-05-10 | Discharge: 2021-05-10 | Disposition: A | Discharge status: Home / Self Care | Payer: BC Managed Care – PPO | Source: Ambulatory Visit | Attending: Student | Admitting: Student

## 2021-05-10 ENCOUNTER — Other Ambulatory Visit: Payer: Self-pay

## 2021-05-10 DIAGNOSIS — R519 Headache, unspecified: Secondary | ICD-10-CM | POA: Diagnosis not present

## 2021-05-23 ENCOUNTER — Ambulatory Visit (HOSPITAL_COMMUNITY): Payer: BC Managed Care – PPO

## 2021-05-23 ENCOUNTER — Encounter (HOSPITAL_COMMUNITY): Payer: Self-pay

## 2021-08-24 ENCOUNTER — Encounter: Payer: Self-pay | Admitting: Orthopedic Surgery

## 2021-08-24 ENCOUNTER — Other Ambulatory Visit: Payer: Self-pay

## 2021-08-24 ENCOUNTER — Ambulatory Visit: Payer: BC Managed Care – PPO | Admitting: Orthopedic Surgery

## 2021-08-24 VITALS — BP 157/71 | HR 71 | Ht 67.0 in | Wt 262.0 lb

## 2021-08-24 DIAGNOSIS — M79641 Pain in right hand: Secondary | ICD-10-CM | POA: Diagnosis not present

## 2021-08-24 DIAGNOSIS — M79601 Pain in right arm: Secondary | ICD-10-CM | POA: Diagnosis not present

## 2021-08-24 DIAGNOSIS — M778 Other enthesopathies, not elsewhere classified: Secondary | ICD-10-CM | POA: Diagnosis not present

## 2021-08-24 NOTE — Progress Notes (Signed)
Chief Complaint  Patient presents with   Arm Pain    Right arm painful started with wrist now entire arm painful     65 year old female presents with pain in the wrist and upper arm.  She says she is doing some extensive keyboarding every month using her index finger to put in the data that is causing her pain in the index finger dorsum of the hand and wrist radiating up into the forearm and lateral elbow  She was seen for pain in the hand earlier in the year and was put on meloxicam but did not get any relief from that  She is diabetic she checks her sugar intermittently currently on oral medication  She has tenderness over the elbow dorsum of the hand somewhat in the wrist somewhat in the intersection between the second and third compartments with normal flexion extension of the wrist normal strength no sensory changes  Impression Encounter Diagnoses  Name Primary?   Pain in right hand    Pain in right arm    Tendinitis of right wrist Yes    Plan recommend diclofenac 75 mg twice a day  If this does not help we have discussed going to prednisone 10 mg 3 times a day #42 if needed  We also ordered an ergonomic mouse, joystick tight already has ergonomic keyboard and has adjusted the height of her chair

## 2021-08-28 ENCOUNTER — Telehealth: Payer: Self-pay | Admitting: Orthopedic Surgery

## 2021-08-28 ENCOUNTER — Ambulatory Visit: Payer: BC Managed Care – PPO | Admitting: Orthopedic Surgery

## 2021-08-28 DIAGNOSIS — M778 Other enthesopathies, not elsewhere classified: Secondary | ICD-10-CM

## 2021-08-28 MED ORDER — DICLOFENAC SODIUM 75 MG PO TBEC: 75.0000 mg | DELAYED_RELEASE_TABLET | Freq: Two times a day (BID) | ORAL | 2 refills | Status: DC

## 2021-08-28 NOTE — Telephone Encounter (Signed)
Call received from patient regarding prescription from visit on 11/3/2 - states it is not at pharmacy:  [facility] on [address] [place]

## 2021-08-28 NOTE — Telephone Encounter (Signed)
Plan recommend diclofenac 75 mg twice a day

## 2021-10-12 NOTE — Progress Notes (Deleted)
65 y.o. G71P2002 Married Caucasian female here for annual exam.    PCP:     Patient's last menstrual period was 10/22/2008.           Sexually active: {yes no:314532}  The current method of family planning is post menopausal status.    Exercising: {yes no:314532}  {types:19826} Smoker:  no  Health Maintenance: Pap:  03-24-18 Neg:Neg HR HPV, 03-12-14 Neg:Neg HR HPV History of abnormal Pap:  no MMG:  10-31-20 Neg/Birads1 Colonoscopy:  ***01-27-10 normal Dr.Roark BMD: ***06-11-18  Result :Normal TDaP: ***PCP Gardasil:   no HIV:no Hep C:no Screening Labs:  Hb today: ***, Urine today: ***   reports that she has never smoked. She has never used smokeless tobacco. She reports that she does not drink alcohol and does not use drugs.  Past Medical History:  Diagnosis Date   Anxiety    Arthritis    Basal cell carcinoma    Charcot's joint of foot    bilateral   Diabetes mellitus without complication ([facility])    Fibromyalgia    Neuropathy     Past Surgical History:  Procedure Laterality Date   ACHILLES TENDON REPAIR Left    bone spur Left    --left foot   BREAST SURGERY     benign breast bx--left--fatty tissue   CARPAL TUNNEL RELEASE Bilateral    DILATION AND CURETTAGE OF UTERUS  2011   for cervical polyp   FOOT SURGERY Left 03/2018   reconstructive surgery    Current Outpatient Medications  Medication Sig Dispense Refill   cholecalciferol (VITAMIN D) 1000 UNITS tablet Take 1,000 Units by mouth 3 (three) times daily.     diclofenac (VOLTAREN) 75 MG EC tablet Take 1 tablet (75 mg total) by mouth 2 (two) times daily with a meal. 60 tablet 2   gabapentin (NEURONTIN) 600 MG tablet Take 600 mg by mouth as needed. Takes 200mg  qAM, 300mg  mid afternoon, 600mg  qhs     metFORMIN (GLUCOPHAGE) 500 MG tablet Take 500 mg by mouth at bedtime.     metolazone (ZAROXOLYN) 5 MG tablet Take 5 mg by mouth as needed.     NON FORMULARY Take 1 tablet by mouth daily. Carbon 98 BX--a fish oil.  Pt. Takes  5x/week     NON FORMULARY Sulfur 24G7   Formula [place]     nystatin (MYCOSTATIN/NYSTOP) powder Apply topically 3 (three) times daily. Apply to affected area for up to 7 days 45 g 2   OVER THE COUNTER MEDICATION Take 1 tablet by mouth every 3 (three) days. Chromic Chloride - po qd     vitamin E 100 UNIT capsule Take 100 Units by mouth daily.     No current facility-administered medications for this visit.    Family History  Problem Relation Age of Onset   Diabetes Mother    Hypertension Mother    Stroke Mother    Other Mother        myeloproliferative disorder   Alzheimer's disease Father    Hypertension Father    Hypertension Maternal Grandmother    Stroke Maternal Grandmother    Diabetes Maternal Grandfather     Review of Systems  Exam:   LMP 10/22/2008     General appearance: alert, cooperative and appears stated age Head: normocephalic, without obvious abnormality, atraumatic Neck: no adenopathy, supple, symmetrical, trachea midline and thyroid normal to inspection and palpation Lungs: clear to auscultation bilaterally Breasts: normal appearance, no masses or tenderness, No nipple  retraction or dimpling, No nipple discharge or bleeding, No axillary adenopathy Heart: regular rate and rhythm Abdomen: soft, non-tender; no masses, no organomegaly Extremities: extremities normal, atraumatic, no cyanosis or edema Skin: skin color, texture, turgor normal. No rashes or lesions Lymph nodes: cervical, supraclavicular, and axillary nodes normal. Neurologic: grossly normal  Pelvic: External genitalia:  no lesions              No abnormal inguinal nodes palpated.              Urethra:  normal appearing urethra with no masses, tenderness or lesions              Bartholins and Skenes: normal                 Vagina: normal appearing vagina with normal color and discharge, no lesions              Cervix: no lesions              Pap taken: {yes no:314532} Bimanual Exam:   Uterus:  normal size, contour, position, consistency, mobility, non-tender              Adnexa: no mass, fullness, tenderness              Rectal exam: {yes no:314532}.  Confirms.              Anus:  normal sphincter tone, no lesions  Chaperone was present for exam:  ***  Assessment:   Well woman visit with gynecologic exam.   Plan: Mammogram screening discussed. Self breast awareness reviewed. Pap and HR HPV as above. Guidelines for Calcium, Vitamin D, regular exercise program including cardiovascular and weight bearing exercise.   Follow up annually and prn.   Additional counseling given.  {yes [id number]. _______ minutes face to face time of which over 50% was spent in counseling.    After visit summary provided.

## 2021-10-21 ENCOUNTER — Ambulatory Visit
Admission: EM | Admit: 2021-10-21 | Discharge: 2021-10-21 | Disposition: A | Discharge status: Home / Self Care | Payer: BC Managed Care – PPO | Attending: Family Medicine | Admitting: Family Medicine

## 2021-10-21 ENCOUNTER — Other Ambulatory Visit: Payer: Self-pay

## 2021-10-21 ENCOUNTER — Ambulatory Visit: Payer: BC Managed Care – PPO

## 2021-10-21 DIAGNOSIS — Z20828 Contact with and (suspected) exposure to other viral communicable diseases: Secondary | ICD-10-CM | POA: Insufficient documentation

## 2021-10-21 DIAGNOSIS — J069 Acute upper respiratory infection, unspecified: Secondary | ICD-10-CM | POA: Diagnosis present

## 2021-10-21 DIAGNOSIS — J029 Acute pharyngitis, unspecified: Secondary | ICD-10-CM | POA: Diagnosis present

## 2021-10-21 LAB — POCT RAPID STREP A (OFFICE): Rapid Strep A Screen: NEGATIVE

## 2021-10-21 MED ORDER — PROMETHAZINE-DM 6.25-15 MG/5ML PO SYRP: 5.0000 mL | ORAL_SOLUTION | Freq: Four times a day (QID) | ORAL | 0 refills | Status: DC | PRN

## 2021-10-21 MED ORDER — LIDOCAINE VISCOUS HCL 2 % MT SOLN: 10.0000 mL | OROMUCOSAL | 0 refills | Status: DC | PRN

## 2021-10-21 NOTE — ED Provider Notes (Signed)
[facility]    CSN: [id number] Arrival date & time: 10/21/21  1049      History   Chief Complaint Chief Complaint  Patient presents with   Cough    White spots on back of throat and a cough    HPI Angela Norman is a 65 y.o. female.   Presenting today with 4-day history of initially a dry cough, then progressed into sore throat and now having white patches in the back of her throat, nasal congestion.  Denies known fever, chills, body aches but does feel fatigued.  No chest pain, shortness of breath, abdominal pain, nausea vomiting or diarrhea.  Taking over-the-counter congestion medication with no relief.  Multiple sick contacts recently but unsure with what.   Past Medical History:  Diagnosis Date   Anxiety    Arthritis    Basal cell carcinoma    Charcot's joint of foot    bilateral   Diabetes mellitus without complication ([place])    Fibromyalgia    Neuropathy     Patient Active Problem List   Diagnosis Date Noted   Musculoskeletal pain 05/21/2012   Stiffness of joint, not elsewhere classified, ankle and foot 08/13/2011   Difficulty in walking(719.7) 08/13/2011   Ankle weakness 08/13/2011    Past Surgical History:  Procedure Laterality Date   ACHILLES TENDON REPAIR Left    bone spur Left    --left foot   BREAST SURGERY     benign breast bx--left--fatty tissue   CARPAL TUNNEL RELEASE Bilateral    DILATION AND CURETTAGE OF UTERUS  2011   for cervical polyp   FOOT SURGERY Left 03/2018   reconstructive surgery    OB History     Gravida  2   Para  2   Term  2   Preterm      AB      Living  2      SAB      IAB      Ectopic      Multiple      Live Births              Home Medications    Prior to Admission medications   Medication Sig Start Date End Date Taking? Authorizing Provider  lidocaine (XYLOCAINE) 2 % solution Use as directed 10 mLs in the mouth or throat every 3 (three) hours as needed for mouth pain.  10/21/21  Yes Volney American, PA-C  promethazine-dextromethorphan (PROMETHAZINE-DM) 6.25-15 MG/5ML syrup Take 5 mLs by mouth 4 (four) times daily as needed. 10/21/21  Yes Volney American, PA-C  cholecalciferol (VITAMIN D) 1000 UNITS tablet Take 1,000 Units by mouth 3 (three) times daily.    [provider]  diclofenac (VOLTAREN) 75 MG EC tablet Take 1 tablet (75 mg total) by mouth 2 (two) times daily with a meal. 08/28/21   Carole Civil, MD  gabapentin (NEURONTIN) 600 MG tablet Take 600 mg by mouth as needed. Takes 200mg  qAM, 300mg  mid afternoon, 600mg  qhs    [provider]  metFORMIN (GLUCOPHAGE) 500 MG tablet Take 500 mg by mouth at bedtime. 02/26/14   [provider]  metolazone (ZAROXOLYN) 5 MG tablet Take 5 mg by mouth as needed.    [provider]  NON FORMULARY Take 1 tablet by mouth daily. Carbon 98 BX--a fish oil.  Pt. Takes 5x/week    [provider]  Baruch Gouty Sulfur 24G7   Formula [place]  [provider]  nystatin (MYCOSTATIN/NYSTOP) powder Apply topically 3 (three) times daily. Apply to affected area for up to 7 days 09/22/20   Nunzio Cobbs, MD  OVER THE COUNTER MEDICATION Take 1 tablet by mouth every 3 (three) days. Chromic Chloride - po qd    [provider]  vitamin E 100 UNIT capsule Take 100 Units by mouth daily.    [provider]    Family History Family History  Problem Relation Age of Onset   Diabetes Mother    Hypertension Mother    Stroke Mother    Other Mother        myeloproliferative disorder   Alzheimer's disease Father    Hypertension Father    Hypertension Maternal Grandmother    Stroke Maternal Grandmother    Diabetes Maternal Grandfather     Social History Social History   Tobacco Use   Smoking status: Never   Smokeless tobacco: Never  Vaping Use   Vaping Use: Never used  Substance Use Topics   Alcohol use: Yes    Comment:  rarely   Drug use: No     Allergies   Patient has no known allergies.   Review of Systems Review of Systems Per HPI  Physical Exam Triage Vital Signs ED Triage Vitals  Enc Vitals Group     BP 10/21/21 1117 (!) 142/83     Pulse Rate 10/21/21 1117 69     Resp 10/21/21 1117 18     Temp 10/21/21 1117 98.4 F (36.9 C)     Temp Source 10/21/21 1117 Oral     SpO2 10/21/21 1117 96 %     Weight --      Height --      Head Circumference --      Peak Flow --      Pain Score 10/21/21 1114 3     Pain Loc --      Pain Edu? --      Excl. in [place]? --    No data found.  Updated Vital Signs BP (!) 142/83 (BP Location: Right Arm)    Pulse 69    Temp 98.4 F (36.9 C) (Oral)    Resp 18    LMP 10/22/2008    SpO2 96%   Visual Acuity Right Eye Distance:   Left Eye Distance:   Bilateral Distance:    Right Eye Near:   Left Eye Near:    Bilateral Near:     Physical Exam Vitals and nursing note reviewed.  Constitutional:      Appearance: Normal appearance. She is not ill-appearing.  HENT:     Head: Atraumatic.     Right Ear: Tympanic membrane and external ear normal.     Left Ear: Tympanic membrane and external ear normal.     Nose: Rhinorrhea present.     Mouth/Throat:     Mouth: Mucous membranes are moist.     Pharynx: Oropharyngeal exudate and posterior oropharyngeal erythema present.     Comments: No significant tonsillar edema but erythema, scattered exudates present.  Uvula midline, oral airway patent Eyes:     Extraocular Movements: Extraocular movements intact.     Conjunctiva/sclera: Conjunctivae normal.  Cardiovascular:     Rate and Rhythm: Normal rate and regular rhythm.     Heart sounds: Normal heart sounds.  Pulmonary:     Effort: Pulmonary effort is normal.     Breath sounds: Normal breath sounds. No wheezing or rales.  Musculoskeletal:        General: Normal range of motion.     Cervical back: Normal range of motion and neck supple.  Skin:    General: Skin  is warm and dry.  Neurological:     Mental Status: She is alert and oriented to person, place, and time.  Psychiatric:        Mood and Affect: Mood normal.        Thought Content: Thought content normal.        Judgment: Judgment normal.     UC Treatments / Results  Labs (all labs ordered are listed, but only abnormal results are displayed) Labs Reviewed  COVID-19, FLU A+B NAA  CULTURE, GROUP A STREP [facility])  POCT RAPID STREP A (OFFICE)    EKG   Radiology No results found.  Procedures Procedures (including critical care time)  Medications Ordered in UC Medications - No data to display  Initial Impression / Assessment and Plan / UC Course  I have reviewed the triage vital signs and the nursing notes.  Pertinent labs & imaging results that were available during my care of the patient were reviewed by me and considered in my medical decision making (see chart for details).     Suspect viral illness causing symptoms, vital signs reassuring, rapid strep negative, throat culture and COVID and flu testing pending.  We will treat symptomatically with viscous lidocaine, salt water gargles, Phenergan DM for cough.  Discussed supportive over-the-counter medications and home care.  Return for acutely worsening symptoms.  Final Clinical Impressions(s) / UC Diagnoses   Final diagnoses:  Exposure to the flu  Sore throat  Viral URI   Discharge Instructions   None    ED Prescriptions     Medication Sig Dispense Auth. Provider   promethazine-dextromethorphan (PROMETHAZINE-DM) 6.25-15 MG/5ML syrup Take 5 mLs by mouth 4 (four) times daily as needed. 100 mL Volney American, PA-C   lidocaine (XYLOCAINE) 2 % solution Use as directed 10 mLs in the mouth or throat every 3 (three) hours as needed for mouth pain. 100 mL Volney American, [place]      PDMP not reviewed this encounter.   Volney American, [place] 10/21/21 1250

## 2021-10-21 NOTE — ED Triage Notes (Signed)
Patient states that on Tuesday night she started with a dry cough.   Patient states that by Thursday she looked in her mouth and there was white bumps on her throat.   Patient states she had some type of respiratory issue at the beginning of December and she treated it with Mucinex DM.  Denies Fever  Denies Exposure

## 2021-10-22 LAB — COVID-19, FLU A+B NAA
Influenza A, NAA: NOT DETECTED
Influenza B, NAA: NOT DETECTED
SARS-CoV-2, NAA: NOT DETECTED

## 2021-10-24 LAB — CULTURE, GROUP A STREP (THRC)

## 2021-10-25 ENCOUNTER — Ambulatory Visit: Payer: BC Managed Care – PPO | Admitting: Obstetrics and Gynecology

## 2021-10-25 DIAGNOSIS — R059 Cough, unspecified: Secondary | ICD-10-CM | POA: Insufficient documentation

## 2021-10-31 ENCOUNTER — Telehealth: Payer: Self-pay | Admitting: Orthopedic Surgery

## 2021-10-31 NOTE — Telephone Encounter (Signed)
Patient called to ask what the name was of the last prescription medication she been prescribed by Dr Aline Brochure; relayed.

## 2021-11-01 ENCOUNTER — Other Ambulatory Visit: Payer: Self-pay

## 2021-11-01 ENCOUNTER — Ambulatory Visit (HOSPITAL_COMMUNITY)
Admission: RE | Admit: 2021-11-01 | Discharge: 2021-11-01 | Disposition: A | Discharge status: Home / Self Care | Payer: BC Managed Care – PPO | Source: Ambulatory Visit | Attending: Family Medicine | Admitting: Family Medicine

## 2021-11-01 ENCOUNTER — Other Ambulatory Visit (HOSPITAL_COMMUNITY): Payer: Self-pay | Admitting: Family Medicine

## 2021-11-01 DIAGNOSIS — R062 Wheezing: Secondary | ICD-10-CM | POA: Insufficient documentation

## 2021-11-20 ENCOUNTER — Other Ambulatory Visit (HOSPITAL_COMMUNITY): Payer: Self-pay | Admitting: Obstetrics and Gynecology

## 2021-11-20 DIAGNOSIS — Z1231 Encounter for screening mammogram for malignant neoplasm of breast: Secondary | ICD-10-CM

## 2021-11-27 ENCOUNTER — Ambulatory Visit (HOSPITAL_COMMUNITY)
Admission: RE | Admit: 2021-11-27 | Discharge: 2021-11-27 | Disposition: A | Discharge status: Home / Self Care | Payer: BC Managed Care – PPO | Source: Ambulatory Visit | Attending: Obstetrics and Gynecology | Admitting: Obstetrics and Gynecology

## 2021-11-27 ENCOUNTER — Other Ambulatory Visit: Payer: Self-pay

## 2021-11-27 DIAGNOSIS — Z1231 Encounter for screening mammogram for malignant neoplasm of breast: Secondary | ICD-10-CM | POA: Diagnosis present

## 2021-11-29 NOTE — Progress Notes (Signed)
66 y.o. G38P2002 Married Caucasian female here for annual exam.    No GYN concerns.  Not taking HRT.  Dealing with respiratory symptoms.   Has had Covid.   PCP:   Allyn Kenner, MD Also sees Dr. Velia Meyer virtually.   She does blood work through her.   Patient's last menstrual period was 10/22/2008.           Sexually active: No.  The current method of family planning is post menopausal status.    Exercising: Yes.    Home exercise. Smoker:  no  Health Maintenance: Pap:  03-24-18 normal, negative HR HPV. History of abnormal Pap:  no MMG:  11-27-21 normal, BI-RADS1, cat B density Colonoscopy:  2011 normal.  She will do it this summer.  BMD:   06-11-18  Result  normal TDaP: 2013 Will receive at health dept in May 2023 Gardasil:   no HIV: never Hep C: never Screening Labs:  PCP   reports that she has never smoked. She has never used smokeless tobacco. She reports current alcohol use. She reports that she does not use drugs.  Past Medical History:  Diagnosis Date   Anxiety    Arthritis    Basal cell carcinoma    Charcot's joint of foot    bilateral   Diabetes mellitus without complication ([facility])    Fibromyalgia    Neuropathy     Past Surgical History:  Procedure Laterality Date   ACHILLES TENDON REPAIR Left    bone spur Left    --left foot   BREAST SURGERY     benign breast bx--left--fatty tissue   CARPAL TUNNEL RELEASE Bilateral    DILATION AND CURETTAGE OF UTERUS  2011   for cervical polyp   FOOT SURGERY Left 03/2018   reconstructive surgery    Current Outpatient Medications  Medication Sig Dispense Refill   cholecalciferol (VITAMIN D) 1000 UNITS tablet Take 1,000 Units by mouth 3 (three) times daily.     diclofenac (VOLTAREN) 75 MG EC tablet Take 1 tablet (75 mg total) by mouth 2 (two) times daily with a meal. 60 tablet 2   gabapentin (NEURONTIN) 600 MG tablet Take 600 mg by mouth as needed. Takes 200mg  qAM, 300mg  mid afternoon, 600mg  qhs     lidocaine (XYLOCAINE) 2  % solution Use as directed 10 mLs in the mouth or throat every 3 (three) hours as needed for mouth pain. 100 mL 0   metFORMIN (GLUCOPHAGE) 500 MG tablet Take 500 mg by mouth at bedtime.     metolazone (ZAROXOLYN) 5 MG tablet Take 5 mg by mouth as needed.     NON FORMULARY Take 1 tablet by mouth daily. Carbon 98 BX--a fish oil.  Pt. Takes 5x/week     NON FORMULARY Sulfur 24G7   Formula [place]     nystatin (MYCOSTATIN/NYSTOP) powder Apply topically 3 (three) times daily. Apply to affected area for up to 7 days 45 g 2   OVER THE COUNTER MEDICATION Take 1 tablet by mouth every 3 (three) days. Chromic Chloride - po qd     promethazine-dextromethorphan (PROMETHAZINE-DM) 6.25-15 MG/5ML syrup Take 5 mLs by mouth 4 (four) times daily as needed. 100 mL 0   vitamin E 100 UNIT capsule Take 100 Units by mouth daily.     No current facility-administered medications for this visit.    Family History  Problem Relation Age of Onset   Diabetes Mother    Hypertension Mother    Stroke  Mother    Other Mother        myeloproliferative disorder   Alzheimer's disease Father    Hypertension Father    Hypertension Maternal Grandmother    Stroke Maternal Grandmother    Diabetes Maternal Grandfather     Review of Systems  All other systems reviewed and are negative.  Exam:   LMP 10/22/2008     General appearance: alert, cooperative and appears stated age Head: normocephalic, without obvious abnormality, atraumatic Neck: no adenopathy, supple, symmetrical, trachea midline and thyroid normal to inspection and palpation Lungs: clear to auscultation bilaterally Breasts: normal appearance, no masses or tenderness, No nipple retraction or dimpling, No nipple discharge or bleeding, No axillary adenopathy Heart: regular rate and rhythm Abdomen: soft, non-tender; no masses, no organomegaly Extremities: extremities normal, atraumatic, no cyanosis or edema Skin: skin color, texture, turgor normal. No  rashes or lesions Lymph nodes: cervical, supraclavicular, and axillary nodes normal. Neurologic: grossly normal  Pelvic: External genitalia:  no lesions              No abnormal inguinal nodes palpated.              Urethra:  normal appearing urethra with no masses, tenderness or lesions              Bartholins and Skenes: normal                 Vagina: normal appearing vagina with normal color and discharge, no lesions              Cervix: no lesions              Pap taken: no Bimanual Exam:  Uterus:  normal size, contour, position, consistency, mobility, non-tender              Adnexa: no mass, fullness, tenderness              Rectal exam: yes.  Confirms.              Anus:  normal sphincter tone, no lesions  Chaperone was present for exam:  Maudie Mercury, CMA  Assessment:   Well woman visit with gynecologic exam.   Plan: Mammogram screening discussed. Self breast awareness reviewed. Pap and HR HPV 2024. Labs with Dr. Marrian Salvage. Follow up annually and prn.  After visit summary provided.

## 2021-12-08 ENCOUNTER — Encounter: Payer: Self-pay | Admitting: Obstetrics and Gynecology

## 2021-12-08 ENCOUNTER — Other Ambulatory Visit: Payer: Self-pay

## 2021-12-08 ENCOUNTER — Ambulatory Visit (INDEPENDENT_AMBULATORY_CARE_PROVIDER_SITE_OTHER): Payer: BC Managed Care – PPO | Admitting: Obstetrics and Gynecology

## 2021-12-08 VITALS — BP 130/80 | HR 85 | Ht 66.0 in | Wt 252.0 lb

## 2021-12-08 DIAGNOSIS — Z01419 Encounter for gynecological examination (general) (routine) without abnormal findings: Secondary | ICD-10-CM | POA: Diagnosis not present

## 2021-12-08 NOTE — Patient Instructions (Signed)

## 2022-05-02 ENCOUNTER — Ambulatory Visit (INDEPENDENT_AMBULATORY_CARE_PROVIDER_SITE_OTHER): Payer: Medicare Other | Admitting: Psychiatry

## 2022-05-02 ENCOUNTER — Encounter (HOSPITAL_COMMUNITY): Payer: Self-pay | Admitting: Psychiatry

## 2022-05-02 ENCOUNTER — Encounter (HOSPITAL_COMMUNITY): Payer: Self-pay

## 2022-05-02 DIAGNOSIS — F411 Generalized anxiety disorder: Secondary | ICD-10-CM | POA: Diagnosis not present

## 2022-05-02 NOTE — Plan of Care (Signed)
Pt participated in development of plan  Problem: Anxiety Disorder CCP ruminating thoughts, worry, muscle tension, irritability Goal: LTG: Patient will score less than 5 on the Generalized Anxiety Disorder 7 Scale (GAD-7) Outcome: Not Progressing Goal: STG: Report a decrease in anxiety symptoms as evidenced by an overall reduction in anxiety score by a minimum of 25% on the Generalized Anxiety Disorder Scale Outcome: Not Progressing

## 2022-05-02 NOTE — Progress Notes (Signed)
IN-PERSON  Comprehensive Clinical Assessment (CCA) Note  05/02/2022 Angela Norman [id number]  Chief Complaint:  Chief Complaint  Patient presents with   Anxiety   Visit Diagnosis: Generalized Anxiety Disorder      CCA Biopsychosocial Intake/Chief Complaint:  "I just want to talk with somebody, want to learn how to cope with anxiety, I also want to deal the anger and resentment regarding husband, We have been married for 41 years and I remember the things I have done, I dwell on this and makes me angry, I just retired June 1, I am restless in trying to adjust to retirement, my friends are working and I don't have other friends.  Current Symptoms/Problems: irritalbility, ruminating about the past, restlessness, worrying about familyand the future especially my daughter's health and anxiety   Patient Reported Schizophrenia/Schizoaffective Diagnosis in Past: No   Strengths: optimistic overall, encourager/ good listener  Preferences: Individual therapy  Abilities: No data recorded  Type of Services Patient Feels are Needed: Individual therapy   Initial Clinical Notes/Concerns: Pt is referred for services by her holistic doctor. Pt reports no psychiatric hospitalizations. She reports no previous involvement in therapy.   Mental Health Symptoms Depression:   Irritability   Duration of Depressive symptoms:  Greater than two weeks   Mania:   Irritability   Anxiety:    Irritability; Restlessness; Tension; Worrying   Psychosis:   None   Duration of Psychotic symptoms: No data recorded  Trauma:   None   Obsessions:   None   Compulsions:   None   Inattention:   None   Hyperactivity/Impulsivity:   None   Oppositional/Defiant Behaviors:   None   Emotional Irregularity:   None   Other Mood/Personality Symptoms:  No data recorded   Mental Status Exam Appearance and self-care  Stature:   Tall   Weight:   Overweight   Clothing:   Casual    Grooming:   Normal   Cosmetic use:   None   Posture/gait:   -- [place] unstable due to neuropathy in foot.)   Motor activity:   Not Remarkable   Sensorium  Attention:   Normal   Concentration:   Normal   Orientation:   X5   Recall/memory:   Normal   Affect and Mood  Affect:   Anxious   Mood:   Anxious   Relating  Eye contact:   Normal   Facial expression:   Responsive   Attitude toward examiner:   Cooperative   Thought and Language  Speech flow:  Soft   Thought content:   Appropriate to Mood and Circumstances   Preoccupation:   Ruminations   Hallucinations:   None   Organization:  No data recorded  [employer] of Knowledge:   Good   Intelligence:   Average   Abstraction:   Normal   Judgement:   Good   Reality Testing:   Realistic   Insight:   Good   Decision Making:   Normal   Social Functioning  Social Maturity:   Responsible   Social Judgement:   Normal   Stress  Stressors:   Family conflict; Transitions; Illness   Coping Ability:   Overwhelmed   Skill Deficits:   None   Supports:   Family; Friends/Service system     Religion: Religion/Spirituality Are You A Religious Person?: Yes What is Your Religious Affiliation?: Protestant How Might This Affect Treatment?: No effects  Leisure/Recreation: Leisure / Recreation Do You Have Hobbies?: Yes  Leisure and Hobbies: working in the yard, reading  Exercise/Diet: Exercise/Diet Do You Exercise?: Yes What Type of Exercise Do You Do?: Run/Walk How Many Times a Week Do You Exercise?: 6-7 times a week Have You Gained or Lost A Significant Amount of Weight in the Past Six Months?: No Do You Follow a Special Diet?: No Do You Have Any Trouble Sleeping?: No   CCA Employment/Education Employment/Work Situation: Employment / Work Situation Employment Situation: Retired (Pt retired on March 22, 2022.) What is the Longest Time Patient has Held a Job?:  17 years Where was the Patient Employed at that Time?: [employer] Has Patient ever Been in the [employer]?: No  Education: Education Did Teacher, adult education From [employer]?: Yes Did Physicist, medical?: Yes What Type of College Degree Do you Have?: Civil Service fast streamer , BA in [place] Did Commerce?: No Did You Have An Individualized Education Program (IIEP): No Did You Have Any Difficulty At School?: No (was very shy) Patient's Education Has Been Impacted by Current Illness: No   CCA Family/Childhood History Family and Relationship History: Family history Marital status: Married (Pt and her husband reside in [place], [place].) Number of Years Married: 41 What types of issues is patient dealing with in the relationship?: resentment over the past, communication issues Are you sexually active?: No Does patient have children?: Yes How many children?: 2 How is patient's relationship with their children?: Pt reports great relationship with 40 yo daughter and 44 yo son  Childhood History:  Childhood History By whom was/is the patient raised?: Both parents Additional childhood history information: Pt was born and reared in [place]. Description of patient's relationship with caregiver when they were a child: wonderful, great childhood Patient's description of current relationship with people who raised him/her: deceased How were you disciplined when you got in trouble as a child/adolescent?: didn't get in trouble very much, threatned with a switch but don't remember it ever being used Does patient have siblings?: No Did patient suffer any verbal/emotional/physical/sexual abuse as a child?: No Did patient suffer from severe childhood neglect?: No Has patient ever been sexually abused/assaulted/raped as an adolescent or adult?: No Was the patient ever a victim of a crime or a disaster?: No Witnessed domestic violence?: No Has patient been  affected by domestic violence as an adult?: No  Child/Adolescent Assessment: N/A     CCA Substance Use Alcohol/Drug Use: Alcohol / Drug Use Pain Medications: see patient record Prescriptions: see patient record Over the Counter: see patient recoerd History of alcohol / drug use?: No history of alcohol / drug abuse    ASAM's:  Six Dimensions of Multidimensional Assessment  Dimension 1:  Acute Intoxication and/or Withdrawal Potential:   Dimension 1:  Description of individual's past and current experiences of substance use and withdrawal: none  Dimension 2:  Biomedical Conditions and Complications:   Dimension 2:  Description of patient's biomedical conditions and  complications: none  Dimension 3:  Emotional, Behavioral, or Cognitive Conditions and Complications:  Dimension 3:  Description of emotional, behavioral, or cognitive conditions and complications: none  Dimension 4:  Readiness to Change:  Dimension 4:  Description of Readiness to Change criteria: none  Dimension 5:  Relapse, Continued use, or Continued Problem Potential:  Dimension 5:  Relapse, continued use, or continued problem potential critiera description: none  Dimension 6:  Recovery/Living Environment:  Dimension 6:  Recovery/Iiving environment criteria description: none  ASAM Severity Score: ASAM's Severity Rating Score: 0  ASAM Recommended Level of Treatment:     Substance use Disorder (SUD) None  Recommendations for Services/Supports/Treatments: Individual therapy/patient attends assessment appointment today.  Confidentiality and limits are discussed.  Nutritional assessment, pain assessment, PHQ 2, C-SSR, and GAD-7 administered.  Individual therapy is recommended 1 time every 1 to 4 weeks to improve coping skills to manage stress and anxiety.  Patient agrees to return for an appointment in 1 to 2 weeks.    DSM5 Diagnoses: Patient Active Problem List   Diagnosis Date Noted   Musculoskeletal pain 05/21/2012    Stiffness of joint, not elsewhere classified, ankle and foot 08/13/2011   Difficulty in walking(719.7) 08/13/2011   Ankle weakness 08/13/2011    Patient Centered Plan: Patient is on the following Treatment Plan(s):  Anxiety   Referrals to Alternative Service(s): Referred to Alternative Service(s):   Place:   Date:   Time:    Referred to Alternative Service(s):   Place:   Date:   Time:    Referred to Alternative Service(s):   Place:   Date:   Time:    Referred to Alternative Service(s):   Place:   Date:   Time:      Collaboration of Care: None needed at this session  Patient/Guardian was advised Release of Information must be obtained prior to any record release in order to collaborate their care with an outside provider. Patient/Guardian was advised if they have not already done so to contact the registration department to sign all necessary forms in order for [place] to release information regarding their care.   Consent: Patient/Guardian gives verbal consent for treatment and assignment of benefits for services provided during this visit. Patient/Guardian expressed understanding and agreed to proceed.   Deja Kaigler E Renay Crammer, LCSW

## 2022-05-14 ENCOUNTER — Telehealth: Payer: Self-pay | Admitting: Internal Medicine

## 2022-05-14 NOTE — Telephone Encounter (Signed)
PATIENT WOULD LIKE TO HAVE HER TCS. SHE WAS SUPPOSED TO HAVE IT IN 2021 BUT WANTED TO WAIT UNTIL AFTER COVID.  PLEASE SEND PATIENT A QUESTIONAIRRE

## 2022-05-15 ENCOUNTER — Encounter: Payer: Self-pay | Admitting: *Deleted

## 2022-05-15 NOTE — Telephone Encounter (Signed)
Colonoscopy questionnaire mailed to patient

## 2022-05-16 ENCOUNTER — Ambulatory Visit (INDEPENDENT_AMBULATORY_CARE_PROVIDER_SITE_OTHER): Payer: Medicare Other | Admitting: Psychiatry

## 2022-05-16 DIAGNOSIS — F411 Generalized anxiety disorder: Secondary | ICD-10-CM | POA: Diagnosis not present

## 2022-05-16 NOTE — Progress Notes (Signed)
IN- PERSON  THERAPIST PROGRESS NOTE  Session Time: Wednesday 05/16/2022 10:10 AM  - 11:00 AM   Participation Level: Active  Behavioral Response: CasualAlertAnxious  Type of Therapy: Individual Therapy Treatment Goals addressed: Patient will score less than 5 on the Generalized Anxiety Disorder 7 Scale (GAD-7Report a decrease in anxiety symptoms as evidenced by an overall reduction in anxiety score by a minimum of 25% on the Generalized Anxiety Disorder Scale   ProgressTowards Goals: Progressing  Interventions: CBT and Supportive  Summary: Angela Norman is a 66 y.o. female who is referred for services by her holistic doctor. Pt reports no psychiatric hospitalizations. She reports no previous involvement in therapy.  Patient states wanting to talk with someone to learn how to cope with anxiety.  She reports anger and resentment regarding her husband to whom she has been married for 41 years.  She reports dwelling on the past.  She also reports adjustment issues related to retiring on June 1.  Current symptoms include irritability, ruminating about the past, restlessness, muscle tension, worrying about family and the future especially daughter's health.  Patient last was seen for the assessment appointment about 2 weeks ago.  She continues to experience symptoms of anxiety as reflected in the GAD-7.  Patient reports worry about a variety of issues including her marriage, her daughter, and her health issues.  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  She reports difficulty enjoying the moment and verbalizes frequent what if thoughts.   Suicidal/Homicidal: Nowithout intent/plan  Therapist Response: Reviewed symptoms, administered GAD-7, discussed results with patient, discussed stressors, facilitated take the patient expressing thoughts and feelings, validated feelings, began to provide psychoeducation on anxiety and stress response, discussed rationale for and assisted patient practice deep  breathing to trigger relaxation response, develop plan with patient to practice deep breathing 5 to 10 minutes 2 times per day  Plan: Return again in 2 weeks.  Diagnosis: Generalized anxiety disorder  Collaboration of Care: Other none needed at this session  Patient/Guardian was advised Release of Information must be obtained prior to any record release in order to collaborate their care with an outside provider. Patient/Guardian was advised if they have not already done so to contact the registration department to sign all necessary forms in order for [place] to release information regarding their care.   Consent: Patient/Guardian gives verbal consent for treatment and assignment of benefits for services provided during this visit. Patient/Guardian expressed understanding and agreed to proceed.   Alonza Smoker, LCSW 05/16/2022

## 2022-05-30 ENCOUNTER — Ambulatory Visit (HOSPITAL_COMMUNITY): Payer: Self-pay | Admitting: Psychiatry

## 2022-06-13 ENCOUNTER — Ambulatory Visit (INDEPENDENT_AMBULATORY_CARE_PROVIDER_SITE_OTHER): Payer: Medicare Other | Admitting: Psychiatry

## 2022-06-13 DIAGNOSIS — F411 Generalized anxiety disorder: Secondary | ICD-10-CM | POA: Diagnosis not present

## 2022-06-13 NOTE — Progress Notes (Signed)
IN- PERSON  THERAPIST PROGRESS NOTE  Session Time: Wednesday 06/13/2022 10:06 AM - 11:05 AM              Participation Level: Active  Behavioral Response: CasualAlertAnxious  Type of Therapy: Individual Therapy Treatment Goals addressed: Patient will score less than 5 on the Generalized Anxiety Disorder 7 Scale (GAD-7Report a decrease in anxiety symptoms as evidenced by an overall reduction in anxiety score by a minimum of 25% on the Generalized Anxiety Disorder Scale   ProgressTowards Goals: Progressing  Interventions: CBT and Supportive  Summary: Angela Norman is a 66 y.o. female who is referred for services by her holistic doctor. Pt reports no psychiatric hospitalizations. She reports no previous involvement in therapy.  Patient states wanting to talk with someone to learn how to cope with anxiety.  She reports anger and resentment regarding her husband to whom she has been married for 41 years.  She reports dwelling on the past.  She also reports adjustment issues related to retiring on June 1.  Current symptoms include irritability, ruminating about the past, restlessness, muscle tension, worrying about family and the future especially daughter's health.  Patient last was seen for the assessment appointment about 4 weeks ago.  She continues to experience symptoms of anxiety but has been successfully using deep breathing to try to manage symptoms.  She continues to worry about a variety of issues including her children and her marriage.  She continues to verbalize what if thoughts.  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   Suicidal/Homicidal: Nowithout intent/plan  Therapist Response: Reviewed symptoms, praised and reinforced patient's use of deep breathing, discussed effects, developed plan with patient to continue practicing between sessions, discussed stressors, facilitated patient expressing  thoughts and feelings, validated feelings, assisted patient examine her worry thoughts with the use of worry exploration handout, developed plan with patient to use to address worry of thoughts between session, continued to provide psychoeducation on anxiety and stress response, discussed rationale for and assisted patient practice a body scan exercise to trigger relaxation response, developed plan with patient to practice between sessions  Plan: Return again in 2 weeks.  Diagnosis: Generalized anxiety disorder  Collaboration of Care: Other none needed at this session  Patient/Guardian was advised Release of Information must be obtained prior to any record release in order to collaborate their care with an outside provider. Patient/Guardian was advised if they have not already done so to contact the registration department to sign all necessary forms in order for [place] to release information regarding their care.   Consent: Patient/Guardian gives verbal consent for treatment and assignment of benefits for services provided during this visit. Patient/Guardian expressed understanding and agreed to proceed.   Alonza Smoker, LCSW 06/13/2022

## 2022-06-14 ENCOUNTER — Encounter: Payer: Self-pay | Admitting: *Deleted

## 2022-06-20 ENCOUNTER — Telehealth: Payer: Self-pay | Admitting: *Deleted

## 2022-06-20 NOTE — Telephone Encounter (Signed)
Patient called regarding scheduling colonoscopy. She has an up coming appt on 07/09/22 at 2:30 pm to discuss the colonoscopy. We will schedule the colonoscopy when she comes in that day. Verbalized understanding.

## 2022-06-28 DIAGNOSIS — R609 Edema, unspecified: Secondary | ICD-10-CM | POA: Insufficient documentation

## 2022-06-28 DIAGNOSIS — E782 Mixed hyperlipidemia: Secondary | ICD-10-CM | POA: Insufficient documentation

## 2022-06-28 DIAGNOSIS — M797 Fibromyalgia: Secondary | ICD-10-CM | POA: Insufficient documentation

## 2022-06-28 DIAGNOSIS — F419 Anxiety disorder, unspecified: Secondary | ICD-10-CM | POA: Insufficient documentation

## 2022-06-28 DIAGNOSIS — E1165 Type 2 diabetes mellitus with hyperglycemia: Secondary | ICD-10-CM | POA: Insufficient documentation

## 2022-06-28 DIAGNOSIS — M1990 Unspecified osteoarthritis, unspecified site: Secondary | ICD-10-CM | POA: Insufficient documentation

## 2022-07-05 ENCOUNTER — Ambulatory Visit (INDEPENDENT_AMBULATORY_CARE_PROVIDER_SITE_OTHER): Payer: Medicare Other | Admitting: Psychiatry

## 2022-07-05 ENCOUNTER — Ambulatory Visit (HOSPITAL_COMMUNITY): Payer: Medicare Other | Admitting: Psychiatry

## 2022-07-05 DIAGNOSIS — F411 Generalized anxiety disorder: Secondary | ICD-10-CM | POA: Diagnosis not present

## 2022-07-05 NOTE — Progress Notes (Signed)
IN- PERSON  THERAPIST PROGRESS NOTE  Session Time: Thursday 07/05/2022 10:12 AM  - 11:00 AM           Participation Level: Active  Behavioral Response: CasualAlertAnxious  Type of Therapy: Individual Therapy Treatment Goals addressed: Patient will score less than 5 on the Generalized Anxiety Disorder 7 Scale (GAD-7Report a decrease in anxiety symptoms as evidenced by an overall reduction in anxiety score by a minimum of 25% on the Generalized Anxiety Disorder Scale   ProgressTowards Goals: Progressing  Interventions: CBT and Supportive  Summary: JULEA HUTTO is a 66 y.o. female who is referred for services by her holistic doctor. Pt reports no psychiatric hospitalizations. She reports no previous involvement in therapy.  Patient states wanting to talk with someone to learn how to cope with anxiety.  She reports anger and resentment regarding her husband to whom she has been married for 41 years.  She reports dwelling on the past.  She also reports adjustment issues related to retiring on June 1.  Current symptoms include irritability, ruminating about the past, restlessness, muscle tension, worrying about family and the future especially daughter's health.  Patient last was seen for appointment about 3 weeks ago.  She reports decreased intensity and frequency of symptoms of anxiety as reflected in the GAD-7.  She reports she practicing body scan meditation and reports this has been helpful.  This has resulted in increased awareness of stress in her body and earlier recognition of stress in her body as well as decreased stress.  Patient is very pleased with her efforts/progress and reports decreased irritability and frustration regarding interaction with husband and stressful phone calls from daughter.  He continues to have concerns about both her children but reports decreased worry.  Patient reports she has been going outside more and enjoying the weather.  She also recently enjoyed having  lunch with her former coworkers.  She still becomes frustrated with some of her husband's behavior but now is trying to take a pause before responding.  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                      Suicidal/Homicidal: Nowithout intent/plan  Therapist Response: Reviewed symptoms, administered GAD-7, discussed results, praised and reinforced patient's use of body scan to trigger relaxation response, discussed effects, developed plan with patient to continue using body scan, discussed stressors, facilitated patient expressing thoughts and feelings, validated feelings, assisted patient identify realistic expectations regarding husband's behavior, assisted patient examine her anxiety provoking thoughts about attending class reunion, assisted patient challenge/replace negative thoughts with more rational thoughts  Plan: Return again in 2 weeks.  Diagnosis: Generalized anxiety disorder  Collaboration of Care: Other none needed at this session  Patient/Guardian was advised Release of Information must be obtained prior to any record release in order to collaborate their care with an outside provider. Patient/Guardian was advised if they have not already done so to contact the registration department to sign all necessary forms in order for [place] to release information regarding their care.   Consent: Patient/Guardian gives verbal consent for treatment and assignment of benefits for services provided during this visit. Patient/Guardian expressed understanding and agreed to proceed.   Alonza Smoker, LCSW 07/05/2022

## 2022-07-09 ENCOUNTER — Ambulatory Visit (INDEPENDENT_AMBULATORY_CARE_PROVIDER_SITE_OTHER): Payer: Medicare Other | Admitting: Gastroenterology

## 2022-07-09 ENCOUNTER — Encounter: Payer: Self-pay | Admitting: Gastroenterology

## 2022-07-09 VITALS — BP 117/76 | HR 77 | Temp 97.2°F | Ht 67.0 in | Wt 238.6 lb

## 2022-07-09 DIAGNOSIS — Z1211 Encounter for screening for malignant neoplasm of colon: Secondary | ICD-10-CM | POA: Diagnosis not present

## 2022-07-09 DIAGNOSIS — K5904 Chronic idiopathic constipation: Secondary | ICD-10-CM

## 2022-07-09 NOTE — Patient Instructions (Signed)
For your constipation: Continue to take Citrucel daily You may take MiraLAX 17 g or 1 capful every other day or as needed also for constipation.  We are scheduling you for colonoscopy in the near future with Dr. Gala Romney.  You will receive separate instructions in the mail, but in summary you will need to hold your vitamin D for 10 days prior to your procedure.  You will also need to hold Ozempic for 1 week prior to your procedure (procedure to be on day 8 or after your last dose).   We will have you follow-up 2 months after your procedures.  We can do this virtually if able.  It was a pleasure to see you today. I want to create trusting relationships with patients. If you receive a survey regarding your visit,  I greatly appreciate you taking time to fill this out on paper or through your MyChart. I value your feedback.  Venetia Night, MSN, FNP-BC, AGACNP-BC [facility]

## 2022-07-09 NOTE — Progress Notes (Signed)
GI Office Note    Referring Provider: Celene Squibb, MD Primary Care Physician:  Celene Squibb, MD  Primary Gastroenterologist: Dr. Gala Romney  Chief Complaint   Chief Complaint  Patient presents with   New Patient (Initial Visit)    Here for consult for colonoscopy. Also new onset of diarrhea/constipation due too medication recently added    History of Present Illness   Angela Norman is a 66 y.o. female presenting today at the request of Celene Squibb, MD for screening colonoscopy.  Last colonoscopy in March 2011: normal exam.    Today: About 32 years ago diagnosed with IBS and PCP and used citracel for a few years and then stopped. Started back about 3 years ago taking that regularly. Certain things she eats will aggravate it. Does have a lot of anxiety and does admit to many episodes of using the bathroom 1-3 times before going to new places.   Has been on metformin for 20+ years ago and this spring she stopped it and started ozempic in Feb/March. At first she was having pain and then just discomfort. When she went to 1 mg she began having constipation and then went back down to 0.5 mg (had 3 bouts of diarrhea and then was constipated for 5 days.).Now alternating doses and she has began feeling better although her weight loss has not been as good since doing that.   Has fear of having accidents - does not like traveling due to her urgency. Prior to her ozempic she would take imodium if traveling to prevent diarrhea but then would be constipated. Still has been taking the Citrucel (2 pills) at night. Has also tried dulcolax at night as well at times. Has never tried miralax. With ozempic her stools are more firm but her stools vary. Since being on ozempic she does need to strain a little. Denies lack of appetite (occasionally the result of the ozempic).Has not had any melena or BRBPR, abdominal pain, reflux, dysphagia, nausea/vomiting.   Does take Vitamin E and other supplements  that contain it.    Current Outpatient Medications  Medication Sig Dispense Refill   cholecalciferol (VITAMIN D) 1000 UNITS tablet Take 1,000 Units by mouth 3 (three) times daily.     desonide (DESOWEN) 0.05 % cream Apply topically 2 (two) times daily as needed.     gabapentin (NEURONTIN) 600 MG tablet Take 600 mg by mouth as needed. Takes '200mg'$  qAM, '300mg'$  mid afternoon, '600mg'$  qhs     metolazone (ZAROXOLYN) 5 MG tablet Take 5 mg by mouth as needed.     NON FORMULARY Take 1 tablet by mouth daily. Carbon 98 BX--a fish oil.  Pt. Takes 5x/week     OVER THE COUNTER MEDICATION Take 1 tablet by mouth every 3 (three) days. Chromic Chloride - po qd     Semaglutide,0.25 or 0.'5MG'$ /DOS, (OZEMPIC, 0.25 OR 0.5 MG/DOSE,) 2 MG/3ML SOPN Inject into the skin once a week.     vitamin E 100 UNIT capsule Take 100 Units by mouth daily.     No current facility-administered medications for this visit.    Past Medical History:  Diagnosis Date   Anxiety    Arthritis    Basal cell carcinoma    Charcot's joint of foot    bilateral   Diabetes mellitus without complication (HCC)    Fibromyalgia    Neuropathy     Past Surgical History:  Procedure Laterality Date   ACHILLES TENDON REPAIR Left  bone spur Left    --left foot   BREAST SURGERY     benign breast bx--left--fatty tissue   CARPAL TUNNEL RELEASE Bilateral    DILATION AND CURETTAGE OF UTERUS  2011   for cervical polyp   FOOT SURGERY Left 03/2018   reconstructive surgery    Family History  Problem Relation Age of Onset   Diabetes Mother    Hypertension Mother    Stroke Mother    Other Mother        myeloproliferative disorder   Alzheimer's disease Father    Hypertension Father    Diabetes Maternal Grandfather    Hypertension Maternal Grandmother    Stroke Maternal Grandmother     Allergies as of 07/09/2022   (No Known Allergies)    Social History   Socioeconomic History   Marital status: Married    Spouse name: Not on file    Number of children: Not on file   Years of education: Not on file   Highest education level: Not on file  Occupational History   Not on file  Tobacco Use   Smoking status: Never   Smokeless tobacco: Never  Vaping Use   Vaping Use: Never used  Substance and Sexual Activity   Alcohol use: Yes    Comment: rarely   Drug use: No   Sexual activity: Not Currently    Partners: Male    Birth control/protection: Post-menopausal  Other Topics Concern   Not on file  Social History Narrative   Not on file   Social Determinants of Health   Financial Resource Strain: Not on file  Food Insecurity: Not on file  Transportation Needs: Not on file  Physical Activity: Not on file  Stress: Not on file  Social Connections: Not on file  Intimate Partner Violence: Not on file     Review of Systems   Gen: Denies any fever, chills, fatigue, weight loss, lack of appetite.  CV: Denies chest pain, heart palpitations, peripheral edema, syncope.  Resp: Denies shortness of breath at rest or with exertion. Denies wheezing or cough.  GI: see HPI GU : Denies urinary burning, urinary frequency, urinary hesitancy MS: Denies joint pain, muscle weakness, cramps, or limitation of movement.  Derm: Denies rash, itching, dry skin Psych: Denies depression, anxiety, memory loss, and confusion Heme: Denies bruising, bleeding, and enlarged lymph nodes.   Physical Exam   BP 117/76   Pulse 77   Temp (!) 97.2 F (36.2 C)   Ht '5\' 7"'$  (1.702 m)   Wt 238 lb 9.6 oz (108.2 kg)   LMP 10/22/2008   BMI 37.37 kg/m   General:   Alert and oriented. Pleasant and cooperative. Well-nourished and well-developed.  Head:  Normocephalic and atraumatic. Eyes:  Without icterus, sclera clear and conjunctiva pink.  Ears:  Normal auditory acuity. Mouth:  No deformity or lesions, oral mucosa pink.  Lungs:  Clear to auscultation bilaterally. No wheezes, rales, or rhonchi. No distress.  Heart:  S1, S2 present without murmurs  appreciated.  Abdomen:  +BS, soft, non-tender and non-distended. No HSM noted. No guarding or rebound. No masses appreciated.  Rectal:  Deferred  Msk:  Symmetrical without gross deformities. Normal posture. Extremities:  Without edema. Neurologic:  Alert and  oriented x4;  grossly normal neurologically. Skin:  Intact without significant lesions or rashes. Psych:  Alert and cooperative. Normal mood and affect.   Assessment   Angela Norman is a 66 y.o. female with a history of anxiety, arthritis,  fibromyalgia, diabetes presenting today to schedule screening colonoscopy.   Screening for colon cancer: She denies any melena, BRBPR, abdominal pain, reflux, dysphagia, nausea/vomiting.  Also not having any chest pain, shortness of breath, or lower extremity edema.  Does have metolazone to use if needed.  Occasionally will have lack of appetite or early satiety related to her Ozempic but generally does not greatly affect her.  Has actively lost about 20 pounds since beginning of this year.  Currently she has alternating doses of 0.5 mg and 1 mg weekly.  Discussed medication adjustments needed to repeat colonoscopy.  Her last colonoscopy was in 2011 and was normal.  We will proceed with colonoscopy with Dr. Gala Romney in the near future.  Constipation: History of constipation/IBS for 30+ years, and was managed with Citrucel for few years and then stopped.  Restarted taking this about 3 years prior and has been taking on a regular basis.  Reports that certain things to aggravate her constipation but is unable to pinpoint certain things.  With her anxiety, she will have multiple stools a day at times when she may be going to new places or traveling or has an event.  This has been typical for her and is unchanged.  She recently started Ozempic around February/March of this year to help further manage her diabetes and help with weight loss as she was previously on metformin for 20+ years.  After starting Ozempic  she began having some constipation which worsened after she got up to 1 mg dosing.  When she cut back down to 0.5 mg dosing she would have a little bit of diarrhea followed by constipation.  Currently she has alternating Ozempic doses and has been a bit more regular with her bowel habits although her weight loss is not as good, and has continued Citrucel.  Occasionally will use Dulcolax at night to help her with bowel movements.  She should continue Citrucel daily and introduce MiraLAX 17 g every other day as needed to help control constipation.  We did discuss rare use of Imodium if she needs it for looser stools that occur with severe anxiety episodes.  PLAN   Proceed with colonoscopy with propofol by Dr. Gala Romney in near future: the risks, benefits, and alternatives have been discussed with the patient in detail. The patient states understanding and desires to proceed. ASA 2 Hold Ozempic for 1 week prior to procedure (can have procedure on 8th day or after), resume at next scheduled dose or day after procedure.  Hold vitamin E for 10 days prior to procedure.  Miralax 1 capful every other day.  Continue Citrucel daily.  Follow up 2 months post procedure (okay for virtual).   Venetia Night, MSN, FNP-BC, AGACNP-BC [facility]

## 2022-07-19 ENCOUNTER — Ambulatory Visit (INDEPENDENT_AMBULATORY_CARE_PROVIDER_SITE_OTHER): Payer: Medicare Other | Admitting: Psychiatry

## 2022-07-19 ENCOUNTER — Ambulatory Visit (HOSPITAL_COMMUNITY): Payer: Medicare Other | Admitting: Psychiatry

## 2022-07-19 DIAGNOSIS — F411 Generalized anxiety disorder: Secondary | ICD-10-CM | POA: Diagnosis not present

## 2022-07-19 NOTE — Progress Notes (Signed)
IN- PERSON  THERAPIST PROGRESS NOTE  Session Time: Thursday 07/19/2022 10:05 AM - 10:56 AM           Participation Level: Active  Behavioral Response: CasualAlertAnxious  Type of Therapy: Individual Therapy Treatment Goals addressed: Patient will score less than 5 on the Generalized Anxiety Disorder 7 Scale (GAD-7Report a decrease in anxiety symptoms as evidenced by an overall reduction in anxiety score by a minimum of 25% on the Generalized Anxiety Disorder Scale   ProgressTowards Goals: Progressing  Interventions: CBT and Supportive  Summary: Angela Norman is a 66 y.o. female who is referred for services by her holistic doctor. Pt reports no psychiatric hospitalizations. She reports no previous involvement in therapy.  Patient states wanting to talk with someone to learn how to cope with anxiety.  She reports anger and resentment regarding her husband to whom she has been married for 41 years.  She reports dwelling on the past.  She also reports adjustment issues related to retiring on June 1.  Current symptoms include irritability, ruminating about the past, restlessness, muscle tension, worrying about family and the future especially daughter's health.  Patient last was seen for appointment about 2 weeks ago.  She reports slight increase in  intensity and frequency of symptoms of anxiety as reflected in the GAD-7.  She initially cannot identify any triggers.  She reports experiencing nervousness, irritability, and feeling on edge.  She eventually identifies she was experiencing feelings of loneliness.  She also reports she had been unable to participate in her usual walking routine due to problems with neuropathy in her feet and Charcot disease.  Patient is pleased she continues to experience decreased worry regarding her son and has continued to improve her use of relaxation techniques when receiving stressful phone calls from daughter. Suicidal/Homicidal: Nowithout  intent/plan  Therapist Response: Reviewed symptoms, administered GAD-7, discussed results, praised and reinforced patient's continued use of body scan to trigger relaxation response, discussed effects, this did patient identify triggers of increased anxiety, assisted patient identify triggers of loneliness, facilitated patient expressing thoughts and feelings regarding loss of socialization related to retirement as well as patient and her husband leaving their former church several years ago, assisted patient identify ways to increase social involvement including calling friends/going out for coffee-out to lunch with friends developed plan with patient to have social contact via phone 1 time per week and to have face-to-face contact with a friend 1 time per week between sessions, distant patient identify and address thoughts and processes that may inhibit implementation of plan  Plan: Return again in 2 weeks.  Diagnosis: Generalized anxiety disorder  Collaboration of Care: Other none needed at this session  Patient/Guardian was advised Release of Information must be obtained prior to any record release in order to collaborate their care with an outside provider. Patient/Guardian was advised if they have not already done so to contact the registration department to sign all necessary forms in order for [place] to release information regarding their care.   Consent: Patient/Guardian gives verbal consent for treatment and assignment of benefits for services provided during this visit. Patient/Guardian expressed understanding and agreed to proceed.   Alonza Smoker, LCSW 07/19/2022

## 2022-07-25 ENCOUNTER — Telehealth: Payer: Self-pay | Admitting: *Deleted

## 2022-07-25 DIAGNOSIS — Z1211 Encounter for screening for malignant neoplasm of colon: Secondary | ICD-10-CM

## 2022-07-25 MED ORDER — PEG 3350-KCL-NA BICARB-NACL 420 G PO SOLR: 4000.0000 mL | Freq: Once | ORAL | 0 refills | Status: AC

## 2022-07-26 NOTE — Telephone Encounter (Signed)
Pt scheduled with Dr. Gala Romney. Aware will send instructions/pre-op. Rx for prep sent in

## 2022-08-01 ENCOUNTER — Telehealth: Payer: Self-pay | Admitting: *Deleted

## 2022-08-01 NOTE — Telephone Encounter (Signed)
Received call from patient. She is on ozempic. Scheduled for procedure 11/16. Was advised to hold ozempic 1 week prior and that would be on 11/9. Pt states she will be due to take this on 11/5 so should she take this dose or hold it?

## 2022-08-01 NOTE — Telephone Encounter (Signed)
Spoke with pt.She is aware.

## 2022-08-02 ENCOUNTER — Ambulatory Visit (HOSPITAL_COMMUNITY): Payer: Medicare Other | Admitting: Psychiatry

## 2022-08-02 ENCOUNTER — Ambulatory Visit (INDEPENDENT_AMBULATORY_CARE_PROVIDER_SITE_OTHER): Payer: Medicare Other | Admitting: Psychiatry

## 2022-08-02 DIAGNOSIS — F411 Generalized anxiety disorder: Secondary | ICD-10-CM | POA: Diagnosis not present

## 2022-08-02 NOTE — Progress Notes (Signed)
IN- PERSON  THERAPIST PROGRESS NOTE  Session Time: Thursday 08/02/2022  10:05 AM - 11:05 AM         Participation Level: Active  Behavioral Response: CasualAlertAnxious  Type of Therapy: Individual Therapy Treatment Goals addressed: Patient will score less than 5 on the Generalized Anxiety Disorder 7 Scale (GAD-7Report a decrease in anxiety symptoms as evidenced by an overall reduction in anxiety score by a minimum of 25% on the Generalized Anxiety Disorder Scale   ProgressTowards Goals: Progressing  Interventions: CBT and Supportive  Summary: Angela Norman is a 66 y.o. female who is referred for services by her holistic doctor. Pt reports no psychiatric hospitalizations. She reports no previous involvement in therapy.  Patient states wanting to talk with someone to learn how to cope with anxiety.  She reports anger and resentment regarding her husband to whom she has been marr       ied for 41 years.  She reports dwelling on the past.  She also reports adjustment issues related to retiring on June 1.  Current symptoms include irritability, ruminating about the past, restlessness, muscle tension, worrying about family and the future especially daughter's health.  Patient last was seen for appointment about 2 weeks ago.  She reports decreased intensity and frequency of symptoms of anxiety as reflected in the GAD-7.  She implemented plan of increasing social contact and reports this was very helpful.  She reports feeling less lonely.  Patient also has made a list of other people she plans to contact in the future.  She also reports decreased irritability regarding relationship with husband as she has been more accepting of his behavior while also doing problem-solving about things she can do.  She reports she has been more anxious this week triggered by concerns about daughter and son-in-law's concerns about whether or not they can financially afford to have a child in the future.  However,  most of her anxiety this has been triggered about upcoming class reunion this weekend.  Patient reports thoughts and fears of possibly being judged.  She reports continuing to practice deep breathing and body scan meditation.  . Suicidal/Homicidal: Nowithout intent/plan  Therapist Response: Reviewed symptoms, administered GAD-7, discussed results, praised and reinforced patient's increased socialization, discussed effects on thoughts/mood/behavior, developed plan with patient to continue social contact via phone 1 time per week and to have face-to-face contact with a friend 1 time per week, discussed stressors, facilitated expression of thoughts and feelings, validated feelings, assisted patient began to examine her thought patterns about attending class reunion, assisted patient identify/challenge/and replace negative thoughts with more helpful thoughts, developed plan with patient to read replacement thoughts, discussed connection between thoughts/mood/behavior, discussed rationale for and provided instructions on using anxiety log, developed plan with patient to use anxiety log and bring to next session   Plan: Return again in 2 weeks.  Diagnosis: Generalized anxiety disorder  Collaboration of Care: Other none needed at this session  Patient/Guardian was advised Release of Information must be obtained prior to any record release in order to collaborate their care with an outside provider. Patient/Guardian was advised if they have not already done so to contact the registration department to sign all necessary forms in order for [place] to release information regarding their care.   Consent: Patient/Guardian gives verbal consent for treatment and assignment of benefits for services provided during this visit. Patient/Guardian expressed understanding and agreed to proceed.   Alonza Smoker, LCSW 08/02/2022

## 2022-08-16 ENCOUNTER — Ambulatory Visit (HOSPITAL_COMMUNITY): Payer: Medicare Other | Admitting: Psychiatry

## 2022-08-27 ENCOUNTER — Other Ambulatory Visit (HOSPITAL_COMMUNITY)
Admission: RE | Admit: 2022-08-27 | Discharge: 2022-08-27 | Disposition: A | Discharge status: Home / Self Care | Payer: Medicare Other | Source: Ambulatory Visit | Attending: Internal Medicine | Admitting: Internal Medicine

## 2022-08-27 DIAGNOSIS — Z1211 Encounter for screening for malignant neoplasm of colon: Secondary | ICD-10-CM | POA: Diagnosis present

## 2022-08-27 LAB — BASIC METABOLIC PANEL
Anion gap: 8 (ref 5–15)
BUN: 17 mg/dL (ref 8–23)
CO2: 30 mmol/L (ref 22–32)
Calcium: 9.2 mg/dL (ref 8.9–10.3)
Chloride: 102 mmol/L (ref 98–111)
Creatinine, Ser: 0.57 mg/dL (ref 0.44–1.00)
GFR, Estimated: >60 mL/min (ref >60)
Glucose, Bld: 90 mg/dL (ref 70–99)
Potassium: 3.1 mmol/L — ABNORMAL LOW (ref 3.5–5.1)
Sodium: 140 mmol/L (ref 135–145)

## 2022-08-30 ENCOUNTER — Ambulatory Visit (INDEPENDENT_AMBULATORY_CARE_PROVIDER_SITE_OTHER): Payer: Medicare Other | Admitting: Psychiatry

## 2022-08-30 DIAGNOSIS — F411 Generalized anxiety disorder: Secondary | ICD-10-CM | POA: Diagnosis not present

## 2022-08-30 NOTE — Progress Notes (Signed)
IN- PERSON  THERAPIST PROGRESS NOTE  Session Time: Thursday 08/30/2022  10:13 AM -   11:00 AM   Participation Level: Active  Behavioral Response: CasualAlertAnxious  Type of Therapy: Individual Therapy Treatment Goals addressed: Patient will score less than 5 on the Generalized Anxiety Disorder 7 Scale (GAD-7Report a decrease in anxiety symptoms as evidenced by an overall reduction in anxiety score by a minimum of 25% on the Generalized Anxiety Disorder Scale   ProgressTowards Goals: Progressing  Interventions: CBT and Supportive  Summary: Angela Norman is a 66 y.o. female who is referred for services by her holistic doctor. Pt reports no psychiatric hospitalizations. She reports no previous involvement in therapy.  Patient states wanting to talk with someone to learn how to cope with anxiety.  She reports anger and resentment regarding her husband to whom she has been marr       ied for 41 years.  She reports dwelling on the past.  She also reports adjustment issues related to retiring on June 1.  Current symptoms include irritability, ruminating about the past, restlessness, muscle tension, worrying about family and the future especially daughter's health.  Patient last was seen for appointment about 2 weeks ago.  She reports minimal symptoms of anxiety as reflected in the GAD-7.  She is very pleased she attended her class reunion.  She states enjoying this very much and reports initiating conversations with others.  She also has increased her social involvement by going out to lunch with friends, contacting friends by phone, and socializing with her family.  However, patient reports experiencing some anxiety in the past few days triggered by the upcoming Thanksgiving holiday.  This is the first year patient's children will not be visiting her home for Thanksgiving as they are going out of town.  This holiday also triggers more memories of her deceased mother.  Patient reports she has been  practicing body scan meditation and deep breathing . Suicidal/Homicidal: Nowithout intent/plan  Therapist Response: Reviewed symptoms, administered GAD-7, discussed results, praised and reinforced patient's increased socialization, discussed effects on thoughts/mood/behavior, discussed stressors, facilitated expression of thoughts and feelings, validated feelings, assisted patient identify triggers of anxiety, facilitated patient expressing thoughts and feelings about deceased mother, normalized feelings related to grief and loss, provided psychoeducation on integrated grief, assisted patient identify ways to cope with the upcoming holiday i Plan: Return again in 2 weeks.  Diagnosis: Generalized anxiety disorder  Collaboration of Care: Other none needed at this session  Patient/Guardian was advised Release of Information must be obtained prior to any record release in order to collaborate their care with an outside provider. Patient/Guardian was advised if they have not already done so to contact the registration department to sign all necessary forms in order for [place] to release information regarding their care.   Consent: Patient/Guardian gives verbal consent for treatment and assignment of benefits for services provided during this visit. Patient/Guardian expressed understanding and agreed to proceed.   Alonza Smoker, LCSW 08/30/2022

## 2022-09-03 ENCOUNTER — Telehealth: Payer: Self-pay | Admitting: *Deleted

## 2022-09-03 NOTE — Telephone Encounter (Signed)
Pt says she is congested and had went to her provider. He told her to take Mucinex D,Allegra and cough medication and that she would be fine for her colonoscopy on 09/06/22. Advised pt talked with provider here and she says that it would be fine for her to go ahead with colonoscopy. If any fever, vomiting or wants to reschedule to call and let us know. Verbalized understanding

## 2022-09-06 ENCOUNTER — Ambulatory Visit (HOSPITAL_COMMUNITY)
Admission: RE | Admit: 2022-09-06 | Discharge: 2022-09-06 | Disposition: A | Discharge status: Home / Self Care | Payer: Medicare Other | Source: Ambulatory Visit | Attending: Internal Medicine | Admitting: Internal Medicine

## 2022-09-06 ENCOUNTER — Encounter (HOSPITAL_COMMUNITY)
Admission: RE | Disposition: A | Discharge status: Home / Self Care | Payer: Self-pay | Source: Ambulatory Visit | Attending: Internal Medicine

## 2022-09-06 ENCOUNTER — Other Ambulatory Visit: Payer: Self-pay

## 2022-09-06 ENCOUNTER — Ambulatory Visit (HOSPITAL_COMMUNITY): Payer: Medicare Other | Admitting: Certified Registered Nurse Anesthetist

## 2022-09-06 ENCOUNTER — Ambulatory Visit (HOSPITAL_BASED_OUTPATIENT_CLINIC_OR_DEPARTMENT_OTHER): Payer: Medicare Other | Admitting: Certified Registered Nurse Anesthetist

## 2022-09-06 ENCOUNTER — Encounter (HOSPITAL_COMMUNITY): Payer: Self-pay | Admitting: Internal Medicine

## 2022-09-06 DIAGNOSIS — Z1211 Encounter for screening for malignant neoplasm of colon: Secondary | ICD-10-CM

## 2022-09-06 DIAGNOSIS — Z1212 Encounter for screening for malignant neoplasm of rectum: Secondary | ICD-10-CM | POA: Diagnosis not present

## 2022-09-06 DIAGNOSIS — M797 Fibromyalgia: Secondary | ICD-10-CM | POA: Insufficient documentation

## 2022-09-06 DIAGNOSIS — E119 Type 2 diabetes mellitus without complications: Secondary | ICD-10-CM | POA: Insufficient documentation

## 2022-09-06 DIAGNOSIS — D120 Benign neoplasm of cecum: Secondary | ICD-10-CM | POA: Diagnosis not present

## 2022-09-06 DIAGNOSIS — D124 Benign neoplasm of descending colon: Secondary | ICD-10-CM | POA: Diagnosis not present

## 2022-09-06 DIAGNOSIS — K635 Polyp of colon: Secondary | ICD-10-CM | POA: Insufficient documentation

## 2022-09-06 HISTORY — PX: COLONOSCOPY WITH PROPOFOL: SHX5780

## 2022-09-06 HISTORY — PX: POLYPECTOMY: SHX5525

## 2022-09-06 LAB — GLUCOSE, CAPILLARY: Glucose-Capillary: 103 mg/dL — ABNORMAL HIGH (ref 70–99)

## 2022-09-06 SURGERY — COLONOSCOPY WITH PROPOFOL
Anesthesia: General

## 2022-09-06 MED ORDER — LACTATED RINGERS IV SOLN: INTRAVENOUS | Status: DC

## 2022-09-06 MED ORDER — PROPOFOL 10 MG/ML IV BOLUS
INTRAVENOUS | Status: DC | PRN
  Administered 2022-09-06: 100 mg via INTRAVENOUS

## 2022-09-06 MED ORDER — PROPOFOL 500 MG/50ML IV EMUL
INTRAVENOUS | Status: DC | PRN
  Administered 2022-09-06: 150 ug/kg/min via INTRAVENOUS

## 2022-09-06 MED ORDER — LACTATED RINGERS IV SOLN: INTRAVENOUS | Status: DC | PRN

## 2022-09-06 MED ORDER — LIDOCAINE HCL (CARDIAC) PF 100 MG/5ML IV SOSY
PREFILLED_SYRINGE | INTRAVENOUS | Status: DC | PRN
  Administered 2022-09-06: 60 mg via INTRATRACHEAL

## 2022-09-06 NOTE — Discharge Instructions (Signed)
  Colonoscopy Discharge Instructions  Read the instructions outlined below and refer to this sheet in the next few weeks. These discharge instructions provide you with general information on caring for yourself after you leave the hospital. Your doctor may also give you specific instructions. While your treatment has been planned according to the most current medical practices available, unavoidable complications occasionally occur. If you have any problems or questions after discharge, call Dr. Gala Romney at [phone]. ACTIVITY You may resume your regular activity, but move at a slower pace for the next 24 hours.  Take frequent rest periods for the next 24 hours.  Walking will help get rid of the air and reduce the bloated feeling in your belly (abdomen).  No driving for 24 hours (because of the medicine (anesthesia) used during the test).   Do not sign any important legal documents or operate any machinery for 24 hours (because of the anesthesia used during the test).  NUTRITION Drink plenty of fluids.  You may resume your normal diet as instructed by your doctor.  Begin with a light meal and progress to your normal diet. Heavy or fried foods are harder to digest and may make you feel sick to your stomach (nauseated).  Avoid alcoholic beverages for 24 hours or as instructed.  MEDICATIONS You may resume your normal medications unless your doctor tells you otherwise.  WHAT YOU CAN EXPECT TODAY Some feelings of bloating in the abdomen.  Passage of more gas than usual.  Spotting of blood in your stool or on the toilet paper.  IF YOU HAD POLYPS REMOVED DURING THE COLONOSCOPY: No aspirin products for 7 days or as instructed.  No alcohol for 7 days or as instructed.  Eat a soft diet for the next 24 hours.  FINDING OUT THE RESULTS OF YOUR TEST Not all test results are available during your visit. If your test results are not back during the visit, make an appointment with your caregiver to find out the  results. Do not assume everything is normal if you have not heard from your caregiver or the medical facility. It is important for you to follow up on all of your test results.  SEEK IMMEDIATE MEDICAL ATTENTION IF: You have more than a spotting of blood in your stool.  Your belly is swollen (abdominal distention).  You are nauseated or vomiting.  You have a temperature over 101.  You have abdominal pain or discomfort that is severe or gets worse throughout the day.      3 small polyps removed from your colon today  Further recommendations to follow pending review of pathology report   At patient request, I called [facility] at [phone] findings

## 2022-09-06 NOTE — Anesthesia Preprocedure Evaluation (Signed)
Anesthesia Evaluation  Patient identified by MRN, date of birth, ID band Patient awake    Reviewed: Allergy & Precautions, H&P , NPO status , Patient's Chart, lab work & pertinent test results, reviewed documented beta blocker date and time   Airway Mallampati: II  TM Distance: >3 FB Neck ROM: full    Dental no notable dental hx.    Pulmonary neg pulmonary ROS   Pulmonary exam normal breath sounds clear to auscultation       Cardiovascular Exercise Tolerance: Good negative cardio ROS  Rhythm:regular Rate:Normal     Neuro/Psych  PSYCHIATRIC DISORDERS Anxiety      Neuromuscular disease (Fibromyalgia)    GI/Hepatic negative GI ROS, Neg liver ROS,,,  Endo/Other  negative endocrine ROSdiabetes    Renal/GU negative Renal ROS  negative genitourinary   Musculoskeletal   Abdominal   Peds  Hematology negative hematology ROS (+)   Anesthesia Other Findings Charcot's  BMI 36.02  Reproductive/Obstetrics negative OB ROS                             Anesthesia Physical Anesthesia Plan  ASA: 2  Anesthesia Plan: General   Post-op Pain Management:    Induction:   PONV Risk Score and Plan:   Airway Management Planned:   Additional Equipment:   Intra-op Plan:   Post-operative Plan:   Informed Consent: I have reviewed the patients History and Physical, chart, labs and discussed the procedure including the risks, benefits and alternatives for the proposed anesthesia with the patient or authorized representative who has indicated his/her understanding and acceptance.     Dental Advisory Given  Plan Discussed with: CRNA  Anesthesia Plan Comments:         Anesthesia Quick Evaluation

## 2022-09-06 NOTE — Transfer of Care (Signed)
Immediate Anesthesia Transfer of Care Note  Patient: Angela Norman  Procedure(s) Performed: COLONOSCOPY WITH PROPOFOL POLYPECTOMY  Patient Location: Short Stay  Anesthesia Type:General  Level of Consciousness: awake, alert , and oriented  Airway & Oxygen Therapy: Patient Spontanous Breathing  Post-op Assessment: Report given to RN and Post -op Vital signs reviewed and stable  Post vital signs: Reviewed and stable  Last Vitals:  Vitals Value Taken Time  BP 145/55   Temp 98   Pulse 80   Resp 16   SpO2 96     Last Pain:  Vitals:   09/06/22 1009  TempSrc:   PainSc: 0-No pain      Patients Stated Pain Goal: 8 (47/[birth year] 0158)  Complications: No notable events documented.

## 2022-09-06 NOTE — Anesthesia Postprocedure Evaluation (Signed)
Anesthesia Post Note  Patient: KERRIANN KAMPHUIS  Procedure(s) Performed: COLONOSCOPY WITH PROPOFOL POLYPECTOMY  Patient location during evaluation: Phase II Anesthesia Type: General Level of consciousness: awake and alert Pain management: pain level controlled Vital Signs Assessment: post-procedure vital signs reviewed and stable Respiratory status: spontaneous breathing, nonlabored ventilation, respiratory function stable and patient connected to nasal cannula oxygen Cardiovascular status: blood pressure returned to baseline and stable Postop Assessment: no apparent nausea or vomiting Anesthetic complications: no   No notable events documented.   Last Vitals:  Vitals:   09/06/22 0929 09/06/22 1037  BP: (!) 148/57 118/65  Pulse: 78 80  Resp: 12 14  Temp: 36.9 C 36.6 C  SpO2: 100% 100%    Last Pain:  Vitals:   09/06/22 1037  TempSrc: Oral  PainSc: 0-No pain                 Adaleah Forget Clyde Canterbury

## 2022-09-06 NOTE — Op Note (Signed)
[facility] Patient Name: Angela Norman Procedure Date: 09/06/2022 9:56 AM MRN: [id number] Date of Birth: Feb 01, 1956 Attending MD: Norvel Richards , MD, [id number] CSN: [id number] Age: 66 Admit Type: Outpatient Procedure:                Colonoscopy Indications:              Screening for colorectal malignant neoplasm Providers:                Norvel Richards, MD, Rosina Lowenstein, RN,                            Everardo Pacific Referring MD:              Medicines:                Propofol per Anesthesia Complications:            No immediate complications. Estimated Blood Loss:     Estimated blood loss was minimal. Procedure:                Pre-Anesthesia Assessment:                           - Prior to the procedure, a History and Physical                            was performed, and patient medications and                            allergies were reviewed. The patient's tolerance of                            previous anesthesia was also reviewed. The risks                            and benefits of the procedure and the sedation                            options and risks were discussed with the patient.                            All questions were answered, and informed consent                            was obtained. Prior Anticoagulants: The patient has                            taken no anticoagulant or antiplatelet agents. ASA                            Grade Assessment: II - A patient with mild systemic                            disease. After reviewing the risks and benefits,  the patient was deemed in satisfactory condition to                            undergo the procedure.                           After obtaining informed consent, the colonoscope                            was passed under direct vision. Throughout the                            procedure, the patient's blood pressure, pulse, and                             oxygen saturations were monitored continuously. The                            [id number]) scope was introduced through the                            anus and advanced to the the cecum, identified by                            appendiceal orifice and ileocecal valve. The                            colonoscopy was performed without difficulty. The                            patient tolerated the procedure well. The quality                            of the bowel preparation was adequate. The                            ileocecal valve, appendiceal orifice, and rectum                            were photographed. The entire colon was well                            visualized. Scope In: 10:14:28 AM Scope Out: 10:35:45 AM Scope Withdrawal Time: 0 hours 13 minutes 17 seconds  Total Procedure Duration: 0 hours 21 minutes 17 seconds  Findings:      The perianal and digital rectal examinations were normal.      Three sessile polyps were found in the descending colon and cecum. The       polyps were 3 to 5 mm in size. These polyps were removed with a cold       snare. Resection and retrieval were complete. Estimated blood loss was       minimal.      The exam was otherwise without abnormality on direct and retroflexion       views. Impression:               -  Three 3 to 5 mm polyps in the descending colon                            and in the cecum, removed with a cold snare.                            Resected and retrieved.                           - The examination was otherwise normal on direct                            and retroflexion views. Moderate Sedation:      Moderate (conscious) sedation was personally administered by an       anesthesia professional. The following parameters were monitored: oxygen       saturation, heart rate, blood pressure, respiratory rate, EKG, adequacy       of pulmonary ventilation, and response to care. Recommendation:           - Patient has a  contact number available for                            emergencies. The signs and symptoms of potential                            delayed complications were discussed with the                            patient. Return to normal activities tomorrow.                            Written discharge instructions were provided to the                            patient.                           - Advance diet as tolerated.                           - Continue present medications.                           - Repeat colonoscopy date to be determined after                            pending pathology results are reviewed for                            surveillance.                           - Return to GI office (date not yet determined). Procedure Code(s):        --- Professional ---  45385, Colonoscopy, flexible; with removal of                            tumor(s), polyp(s), or other lesion(s) by snare                            technique Diagnosis Code(s):        --- Professional ---                           Z12.11, Encounter for screening for malignant                            neoplasm of colon                           D12.4, Benign neoplasm of descending colon                           D12.0, Benign neoplasm of cecum CPT copyright 2022 [employer]. All rights reserved. The codes documented in this report are preliminary and upon coder review may  be revised to meet current compliance requirements. Cristopher Estimable. Danyal Whitenack, MD Norvel Richards, MD 09/06/2022 10:40:40 AM This report has been signed electronically. Number of Addenda: 0

## 2022-09-06 NOTE — H&P (Signed)
$'@LOGO'l$ @   Primary Care Physician:  Celene Squibb, MD Primary Gastroenterologist:  Dr.   Pre-Procedure History & Physical: HPI:  Angela Norman is a 66 y.o. female is here for a screening colonoscopy.    No bowel symptoms.  Negative colonoscopy 2011.  No family history of colon cancer.  Past Medical History:  Diagnosis Date   Anxiety    Arthritis    Basal cell carcinoma    Charcot's joint of foot    bilateral   Diabetes mellitus without complication (HCC)    Fibromyalgia    Neuropathy     Past Surgical History:  Procedure Laterality Date   ACHILLES TENDON REPAIR Left    bone spur Left    --left foot   BREAST SURGERY     benign breast bx--left--fatty tissue   CARPAL TUNNEL RELEASE Bilateral    COLONOSCOPY     DILATION AND CURETTAGE OF UTERUS  2011   for cervical polyp   FOOT SURGERY Left 03/2018   reconstructive surgery    Prior to Admission medications   Medication Sig Start Date End Date Taking? Authorizing Provider  ascorbic acid (VITAMIN C) 500 MG tablet Take 500 mg by mouth daily.   Yes [provider]  Calcium Citrate (CITRACAL PO) Take 2 capsules by mouth daily.   Yes [provider]  cholecalciferol (VITAMIN D) 1000 UNITS tablet Take 1,000 Units by mouth daily.   Yes [provider]  desonide (DESOWEN) 0.05 % cream Apply 1 Application topically 2 (two) times daily as needed (on eye lids). 03/20/22  Yes [provider]  dextromethorphan-guaiFENesin (MUCINEX DM) 30-600 MG 12hr tablet Take 1 tablet by mouth 2 (two) times daily as needed for cough.   Yes [provider]  Dextromethorphan-guaiFENesin (ROBITUSSIN DM PO) Take 20 mLs by mouth daily as needed (Cough). Night time cough   Yes [provider]  ELDERBERRY PO Take 630 mg by mouth daily.   Yes [provider]  fexofenadine (ALLEGRA) 180 MG tablet Take 180 mg by mouth daily as needed for allergies or rhinitis.   Yes [provider]   gabapentin (NEURONTIN) 600 MG tablet Take 300-600 mg by mouth See admin instructions. Take 300 mg in the afternoon and 600 mg at bedtime   Yes [provider]  metolazone (ZAROXOLYN) 5 MG tablet Take 5 mg by mouth once a week.   Yes [provider]  NON FORMULARY Take 1 tablet by mouth daily. Carbon 98 BX--a fish oil.  Pt. Takes 5x/week   Yes [provider]  OVER THE COUNTER MEDICATION Take 5 tablets by mouth daily. Chromic Chloride   Yes [provider]  Polyethyl Glycol-Propyl Glycol (SYSTANE) 0.4-0.3 % SOLN Place 1 drop into both eyes daily as needed (dry eyes). PF orginal   Yes [provider]  Semaglutide,0.25 or 0.'5MG'$ /DOS, (OZEMPIC, 0.25 OR 0.5 MG/DOSE,) 2 MG/3ML SOPN Inject 1 mg into the skin once a week. 06/28/22  Yes [provider]  vitamin E 100 UNIT capsule Take 100 Units by mouth daily.   Yes [provider]    Allergies as of 07/25/2022   (No Known Allergies)    Family History  Problem Relation Age of Onset   Diabetes Mother    Hypertension Mother    Stroke Mother    Other Mother        myeloproliferative disorder   Alzheimer's disease Father    Hypertension Father    Diabetes Maternal Grandfather  Hypertension Maternal Grandmother    Stroke Maternal Grandmother     Social History   Socioeconomic History   Marital status: Married    Spouse name: Not on file   Number of children: Not on file   Years of education: Not on file   Highest education level: Not on file  Occupational History   Not on file  Tobacco Use   Smoking status: Never   Smokeless tobacco: Never  Vaping Use   Vaping Use: Never used  Substance and Sexual Activity   Alcohol use: Yes    Comment: rarely   Drug use: No   Sexual activity: Not Currently    Partners: Male    Birth control/protection: Post-menopausal  Other Topics Concern   Not on file  Social History Narrative   Not on file   Social Determinants of Health    Financial Resource Strain: Not on file  Food Insecurity: Not on file  Transportation Needs: Not on file  Physical Activity: Not on file  Stress: Not on file  Social Connections: Not on file  Intimate Partner Violence: Not on file    Review of Systems: See HPI, otherwise negative ROS  Physical Exam: BP (!) 148/57   Pulse 78   Temp 98.5 F (36.9 C) (Oral)   Resp 12   Ht '5\' 7"'$  (1.702 m)   Wt 104.3 kg   LMP 10/22/2008   SpO2 100%   BMI 36.02 kg/m  General:   Alert,  Well-developed, well-nourished, pleasant and cooperative in NAD Lungs:  Clear throughout to auscultation.   No wheezes, crackles, or rhonchi. No acute distress. Heart:  Regular rate and rhythm; no murmurs, clicks, rubs,  or gallops. Abdomen:  Soft, nontender and nondistended. No masses, hepatosplenomegaly or hernias noted. Normal bowel sounds, without guarding, and without rebound.   Impression/Plan: Angela Norman is now here to undergo a screening colonoscopy.  Average risk screening examination.   Ozempic held per protocol.  Risks, benefits, limitations, imponderables and alternatives regarding colonoscopy have been reviewed with the patient. Questions have been answered. All parties agreeable.     Notice:  This dictation was prepared with Dragon dictation along with smaller phrase technology. Any transcriptional errors that result from this process are unintentional and may not be corrected upon review.

## 2022-09-07 LAB — SURGICAL PATHOLOGY

## 2022-09-09 ENCOUNTER — Encounter: Payer: Self-pay | Admitting: Internal Medicine

## 2022-09-11 ENCOUNTER — Ambulatory Visit (INDEPENDENT_AMBULATORY_CARE_PROVIDER_SITE_OTHER): Payer: Medicare Other | Admitting: Psychiatry

## 2022-09-11 DIAGNOSIS — F411 Generalized anxiety disorder: Secondary | ICD-10-CM

## 2022-09-11 NOTE — Progress Notes (Signed)
IN- PERSON  THERAPIST PROGRESS NOTE  Session Time: Tuesday  09/11/2022  10:12 AM - 11:00 AM   Participation Level: Active  Behavioral Response: CasualAlertAnxious  Type of Therapy: Individual Therapy Treatment Goals addressed: Patient will score less than 5 on the Generalized Anxiety Disorder 7 Scale (GAD-7Report a decrease in anxiety symptoms as evidenced by an overall reduction in anxiety score by a minimum of 25% on the Generalized Anxiety Disorder Scale   ProgressTowards Goals: Progressing  Interventions: CBT and Supportive  Summary: Angela Norman is a 66 y.o. female who is referred for services by her holistic doctor. Pt reports no psychiatric hospitalizations. She reports no previous involvement in therapy.  Patient states wanting to talk with someone to learn how to cope with anxiety.  She reports anger and resentment regarding her husband to whom she has been marr       ied for 41 years.  She reports dwelling on the past.  She also reports adjustment issues related to retiring on June 1.  Current symptoms include irritability, ruminating about the past, restlessness, muscle tension, worrying about family and the future especially daughter's health.  Patient last was seen for appointment about 2 weeks ago.  She reports minimal symptoms of anxiety as reflected in the GAD-7.  She expresses some anxiety regarding recent conflict with daughter.  She also expresses sadness regarding the recent death of an acquaintance.  However, patient reports doing well overall.  Continues to maintain increased behavioral activation and increased socialization.  She has made plans to manage being alone on Thanksgiving day and is optimistic about being able to cope well with this.  She is pleased with her progress in therapy continues to express frustration regarding difficulty letting go of past disappointments and frustration with husband.  . Suicidal/Homicidal: Nowithout intent/plan  Therapist  Response: Reviewed symptoms, administered GAD-7, discussed results, praised and reinforced patient's increased socialization, discussed stressors, facilitated expression of thoughts and feelings, validated feelings, assisted patient identify realistic expectations of self, began to assist patient examine her thought patterns and expectations regarding husband especially regarding the past, discussed effects on thoughts/mood/behavior   Plan: Return again in 2 weeks.  Diagnosis: Generalized anxiety disorder  Collaboration of Care: Other none needed at this session  Patient/Guardian was advised Release of Information must be obtained prior to any record release in order to collaborate their care with an outside provider. Patient/Guardian was advised if they have not already done so to contact the registration department to sign all necessary forms in order for [place] to release information regarding their care.   Consent: Patient/Guardian gives verbal consent for treatment and assignment of benefits for services provided during this visit. Patient/Guardian expressed understanding and agreed to proceed.   Alonza Smoker, LCSW 09/11/2022

## 2022-09-17 ENCOUNTER — Encounter (HOSPITAL_COMMUNITY): Payer: Self-pay | Admitting: Internal Medicine

## 2022-09-27 ENCOUNTER — Ambulatory Visit (INDEPENDENT_AMBULATORY_CARE_PROVIDER_SITE_OTHER): Payer: Medicare Other | Admitting: Psychiatry

## 2022-09-27 DIAGNOSIS — F411 Generalized anxiety disorder: Secondary | ICD-10-CM
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
## 2022-10-12 IMAGING — MG MM DIGITAL SCREENING BILAT W/ TOMO AND CAD
8 series · 8 of 24 positions shown · non-contrast
Comparison: Previous exam(s).

CLINICAL DATA: Screening.

EXAM:
DIGITAL SCREENING BILATERAL MAMMOGRAM WITH TOMOSYNTHESIS AND CAD
TECHNIQUE: Bilateral screening digital craniocaudal and mediolateral oblique
mammograms were obtained. Bilateral screening digital breast
tomosynthesis was performed. The images were evaluated with
computer-aided detection.

[R CC synth-2D]
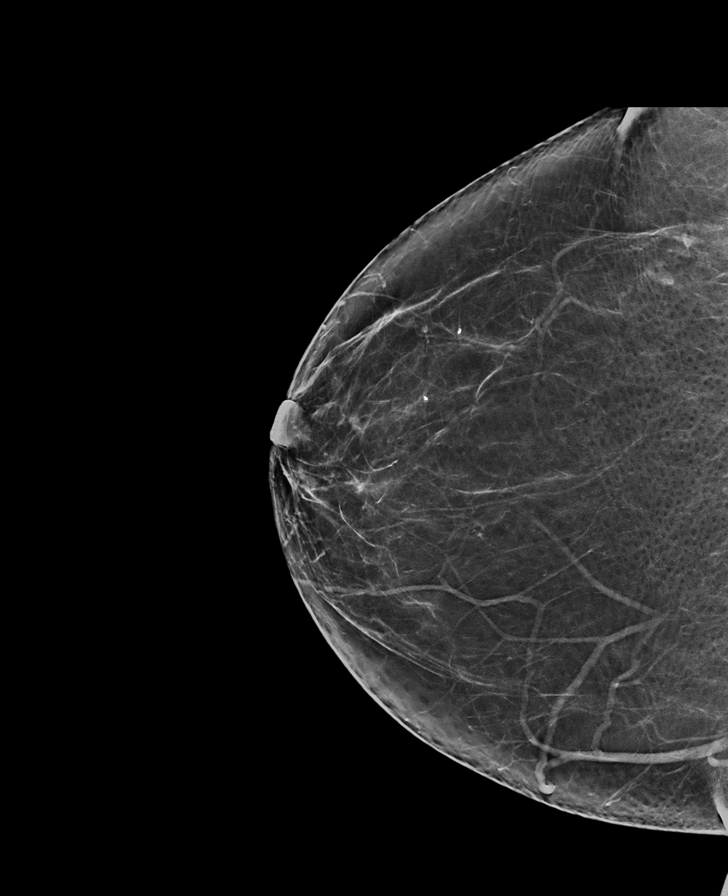

[L CC synth-2D]
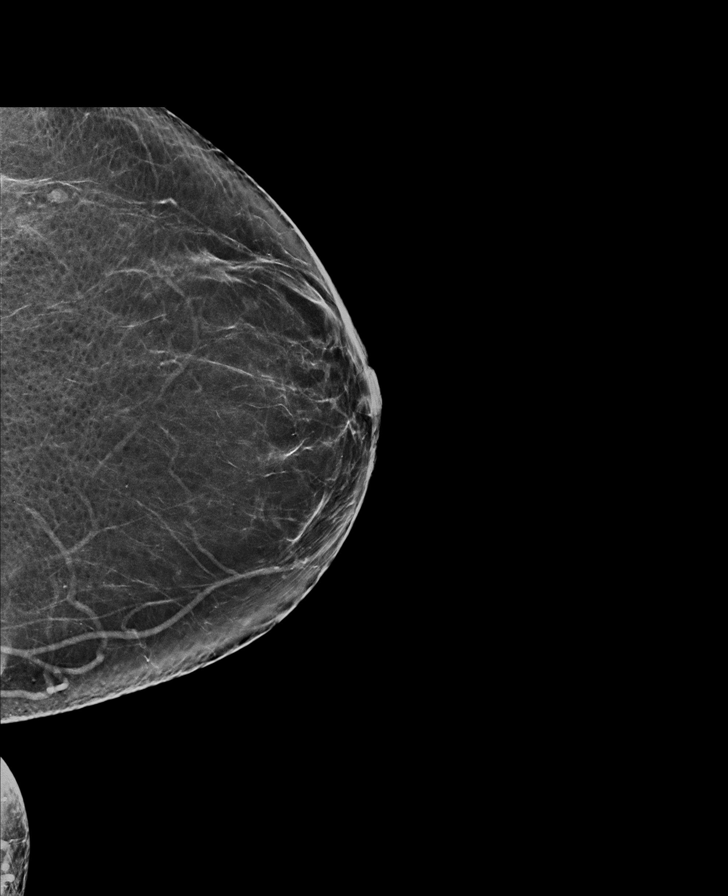

[R MLO synth-2D]
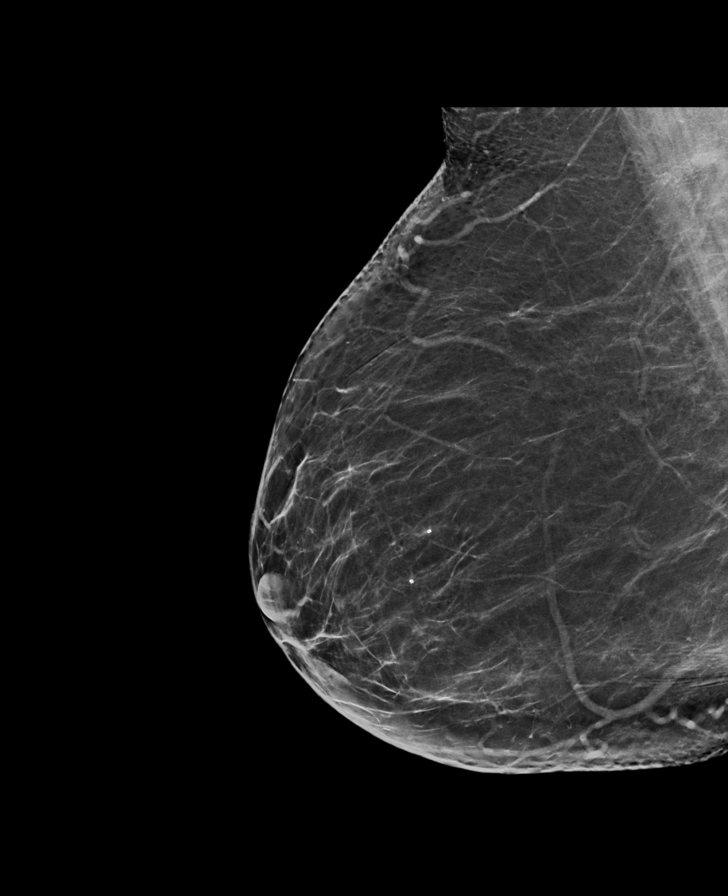

[L MLO synth-2D]
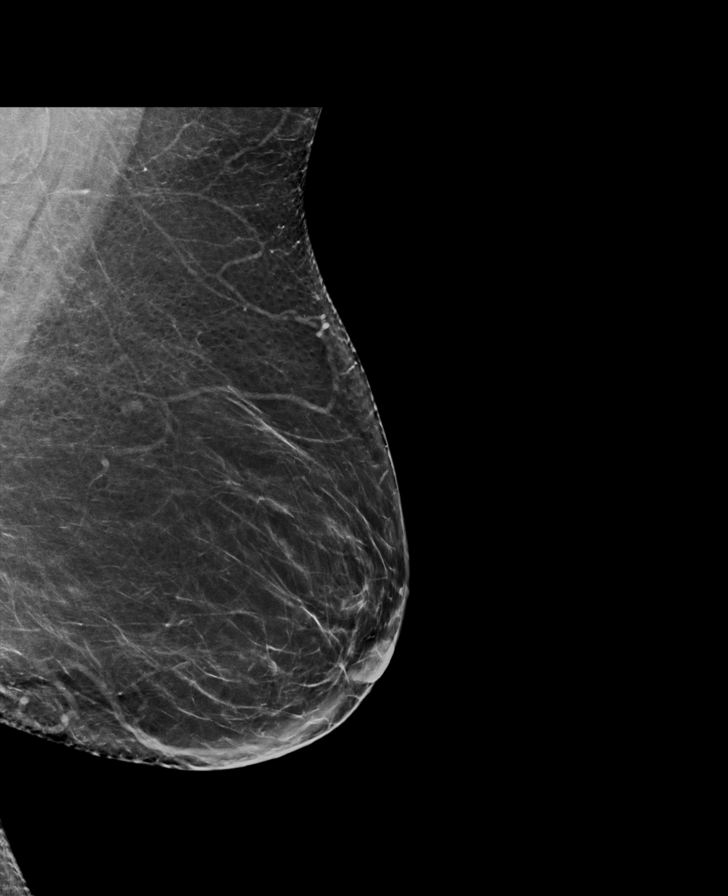

[L MLO tomo · tomo slice 39/78.0]
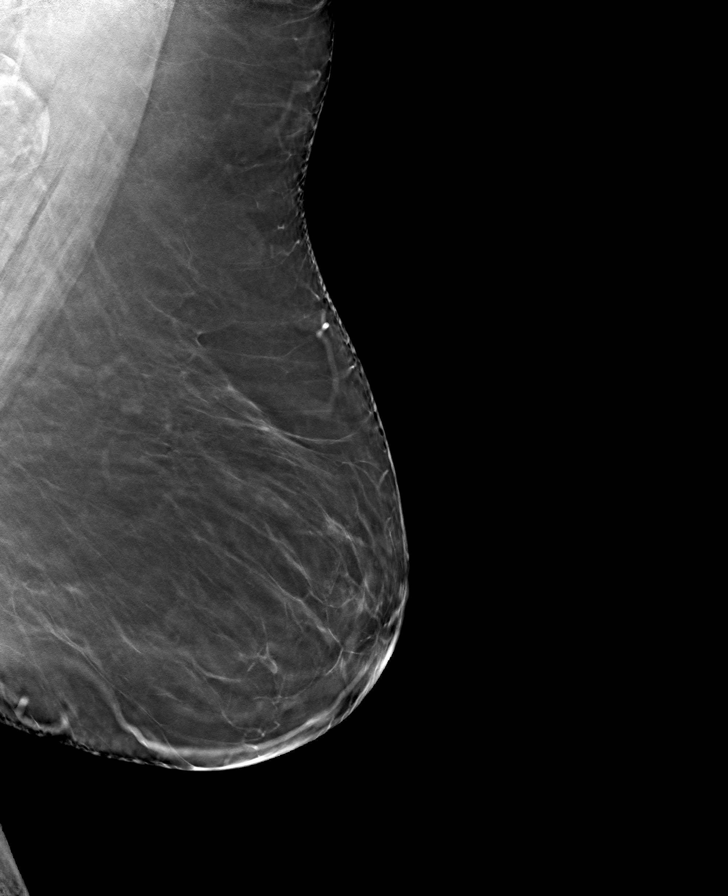

[R CC tomo · tomo slice 36/71.0]
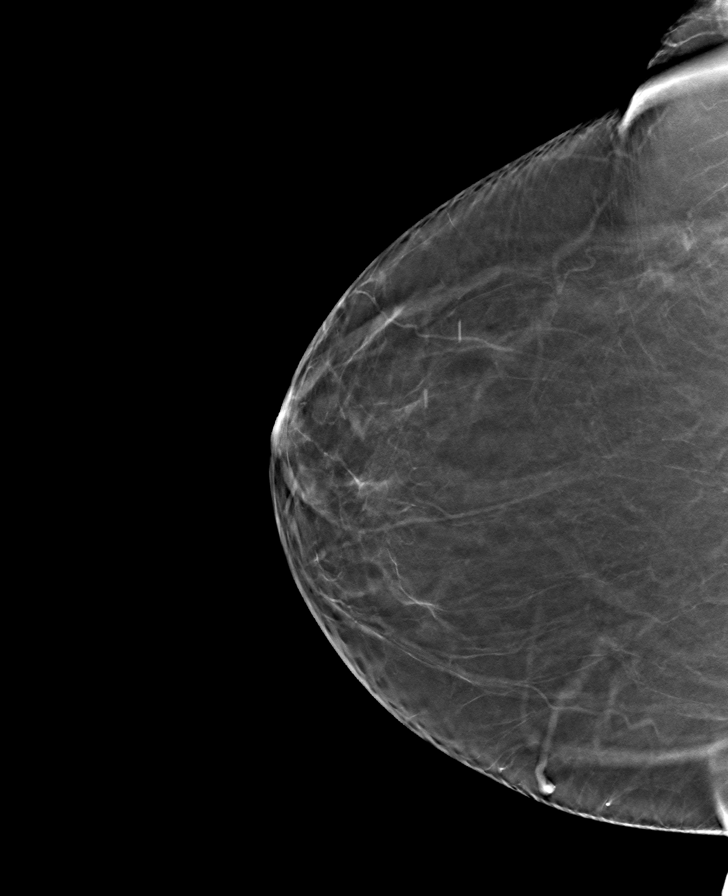

[L CC tomo · tomo slice 35/69.0]
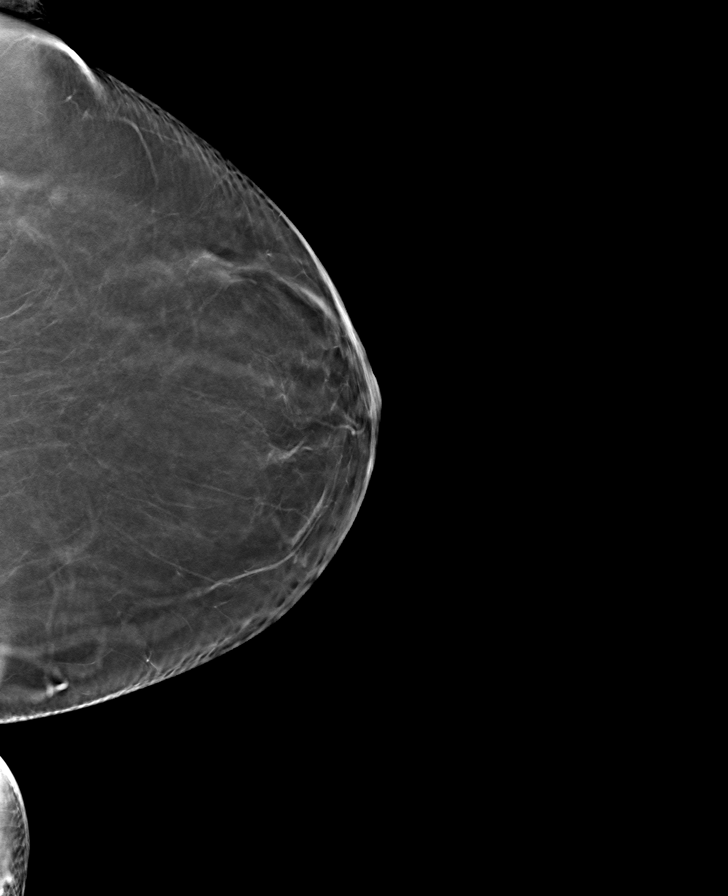

[R MLO tomo · tomo slice 39/76.0]
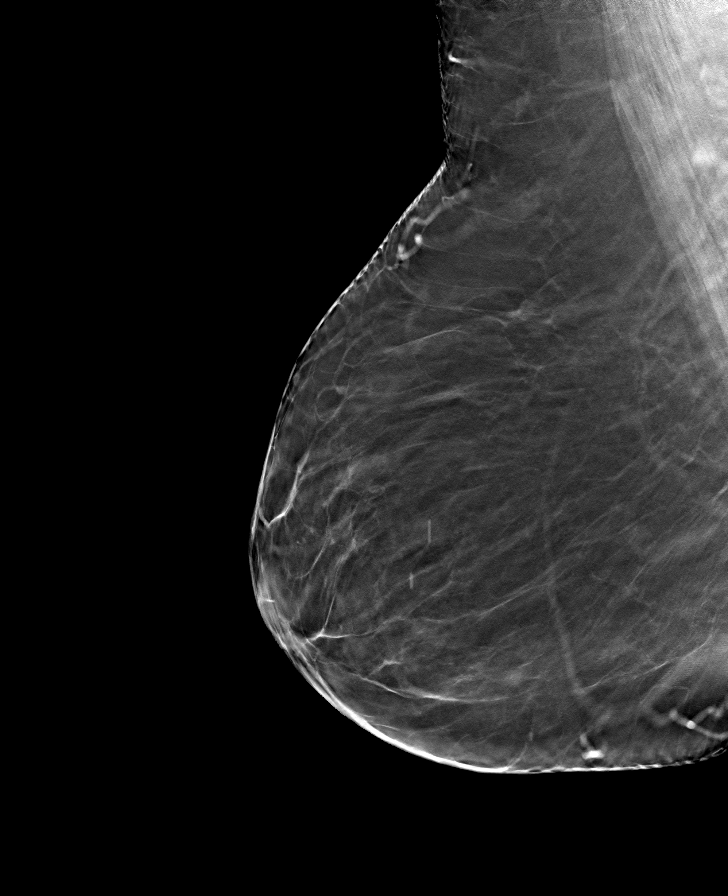

[8 of 24 positions shown; findings below may reference images not displayed]

ACR Breast Density Category b: There are scattered areas of
fibroglandular density.
FINDINGS: There are no findings suspicious for malignancy.
IMPRESSION: No mammographic evidence of malignancy. A result letter of this
screening mammogram will be mailed directly to the patient.

RECOMMENDATION:
Screening mammogram in one year. (Code:51-O-LD2)

BI-RADS CATEGORY  1: Negative.

## 2022-10-16 ENCOUNTER — Ambulatory Visit (INDEPENDENT_AMBULATORY_CARE_PROVIDER_SITE_OTHER): Payer: Medicare Other | Admitting: Psychiatry

## 2022-10-16 DIAGNOSIS — F411 Generalized anxiety disorder: Secondary | ICD-10-CM
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
## 2022-10-31 ENCOUNTER — Ambulatory Visit (INDEPENDENT_AMBULATORY_CARE_PROVIDER_SITE_OTHER): Payer: Medicare Other | Admitting: Psychiatry

## 2022-10-31 DIAGNOSIS — F411 Generalized anxiety disorder: Secondary | ICD-10-CM

## 2022-10-31 NOTE — Progress Notes (Signed)
IN- PERSON  THERAPIST PROGRESS NOTE  Session Time: Wednesday 10/31/2022 10:06 AM - 11:01 AM  Participation Level: Active  Behavioral Response: CasualAlertAnxious  Type of Therapy: Individual Therapy Treatment Goals addressed: Patient will score less than 5 on the Generalized Anxiety Disorder 7 Scale (GAD-7Report a decrease in anxiety symptoms as evidenced by an overall reduction in anxiety score by a minimum of 25% on the Generalized Anxiety Disorder Scale   ProgressTowards Goals: Progressing  Interventions: CBT and Supportive  Summary: STARLINA LAPRE is a 67 y.o. female who is referred for services by her holistic doctor. Pt reports no psychiatric hospitalizations. She reports no previous involvement in therapy.  Patient states wanting to talk with someone to learn how to cope with anxiety.  She reports anger and resentment regarding her husband to whom she has been marr   ied for 41 years.  She reports dwelling on the past.  She also reports adjustment issues related to retiring on June 1.  Current symptoms include irritability, ruminating about the past, restlessness, muscle tension, worrying about family and the future especially daughter's health.  Patient last was seen for appointment about 2 weeks ago.  She reports minimal symptoms of anxiety as reflected in the GAD-7.  However, patient reports increased restlessness and muscle tension.  She reports enjoying the holiday but becoming very stressed when having family over for dinner.  She eventually was able to calm self and relax.  Patient reports trying to use deep breathing, PMR, and body scan meditation as coping tools.  She has not resumed a normal schedule and is having some difficulty adjusting after the holidays.  She is pleased she and her husband have had more conversation and are making efforts to try to identify activities to engage in together.  She is trying to improve assertiveness skills but continues to fear having  possible conflict.   Suicidal/Homicidal: Nowithout intent/plan  Therapist Response: Reviewed symptoms, stressors, facilitated expression of thoughts and feelings, validated feelings, assisted patient try to identify triggers of increased restlessness/muscle tension, praised and reinforced patient's efforts to use relaxation techniques, reviewed anxiety/stress response, reiterated consistent practice of relaxation techniques in addition to using as an intervention, develop plan with patient to begin using daily planning to improve structure and routine, praised and reinforced patient's efforts to use of assertiveness skills, discussed effects, began to examine patient's history and thought patterns regarding conflict, began to explore distress tolerance skills    Plan: Return again in 2 weeks.  Diagnosis: Generalized anxiety disorder  Collaboration of Care: Other none needed at this session  Patient/Guardian was advised Release of Information must be obtained prior to any record release in order to collaborate their care with an outside provider. Patient/Guardian was advised if they have not already done so to contact the registration department to sign all necessary forms in order for [place] to release information regarding their care.   Consent: Patient/Guardian gives verbal consent for treatment and assignment of benefits for services provided during this visit. Patient/Guardian expressed understanding and agreed to proceed.   Alonza Smoker, LCSW 10/31/2022

## 2022-11-09 ENCOUNTER — Encounter: Payer: Self-pay | Admitting: Internal Medicine

## 2022-11-09 ENCOUNTER — Ambulatory Visit (INDEPENDENT_AMBULATORY_CARE_PROVIDER_SITE_OTHER): Payer: Medicare Other | Admitting: Internal Medicine

## 2022-11-09 VITALS — BP 128/74 | HR 68 | Temp 97.9°F | Ht 67.0 in | Wt 237.2 lb

## 2022-11-09 DIAGNOSIS — K5904 Chronic idiopathic constipation: Secondary | ICD-10-CM | POA: Diagnosis not present

## 2022-11-09 MED ORDER — HYOSCYAMINE SULFATE 0.125 MG SL SUBL: 0.1250 mg | SUBLINGUAL_TABLET | SUBLINGUAL | 3 refills | Status: DC | PRN

## 2022-11-09 NOTE — Progress Notes (Signed)
Primary Care Physician:  Celene Squibb, MD Primary Gastroenterologist:  Dr. Gala Romney  Pre-Procedure History & Physical: HPI:  Angela Norman is a 67 y.o. 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  Alternates her dose of Ozempic.  In hopes it will help bowel symptoms.  Occasionally has urge to have a bowel movement  - cannot get to the bathroom in time.  Most the time she is constipated takes a variety of OTC laxatives stool softeners and fiber supplements.  Recent colonoscopy up-to-date-1 small hyperplastic polyp and 1 adenoma; surveillance is a 16.  No rectal bleeding.  Dr. Jaynie Collins homeopathic  MD in [place] prescribes her Ozempic and follows her diabetes.  States she has never been screened for celiac disease  Past Medical History:  Diagnosis Date   Anxiety    Arthritis    Basal cell carcinoma    Charcot's joint of foot    bilateral   Diabetes mellitus without complication (HCC)    Fibromyalgia    Neuropathy     Past Surgical History:  Procedure Laterality Date   ACHILLES TENDON REPAIR Left    bone spur Left    --left foot   BREAST SURGERY     benign breast bx--left--fatty tissue   CARPAL TUNNEL RELEASE Bilateral    COLONOSCOPY     COLONOSCOPY WITH PROPOFOL N/A 09/06/2022   Procedure: COLONOSCOPY WITH PROPOFOL;  Surgeon: Daneil Dolin, MD;  Location: [facility];  Service: Endoscopy;  Laterality: N/A;  10:30am, asa 2   DILATION AND CURETTAGE OF UTERUS  2011   for cervical polyp   FOOT SURGERY Left 03/2018   reconstructive surgery   POLYPECTOMY  09/06/2022   Procedure: POLYPECTOMY;  Surgeon: Daneil Dolin, MD;  Location: [facility];  Service: Endoscopy;;    Prior to Admission medications   Medication Sig Start Date End Date Taking? Authorizing Provider  Calcium Citrate (CITRACAL PO) Take 2 capsules by mouth daily.   Yes [provider]  desonide (DESOWEN) 0.05 % cream Apply 1 Application topically 2 (two)  times daily as needed (on eye lids). 03/20/22  Yes [provider]  gabapentin (NEURONTIN) 600 MG tablet Take 300-600 mg by mouth See admin instructions. Take 300 mg in the afternoon and 600 mg at bedtime   Yes [provider]  metolazone (ZAROXOLYN) 5 MG tablet Take 5 mg by mouth once a week.   Yes [provider]  NON FORMULARY Take 1 tablet by mouth daily. Carbon 98 BX--a fish oil.  Pt. Takes 5x/week   Yes [provider]  OVER THE COUNTER MEDICATION Take 5 tablets by mouth daily. Chromic Chloride   Yes [provider]  Polyethyl Glycol-Propyl Glycol (SYSTANE) 0.4-0.3 % SOLN Place 1 drop into both eyes daily as needed (dry eyes). PF orginal   Yes [provider]  Semaglutide,0.25 or 0.'5MG'$ /DOS, (OZEMPIC, 0.25 OR 0.5 MG/DOSE,) 2 MG/3ML SOPN Inject 1 mg into the skin once a week. 06/28/22  Yes [provider]    Allergies as of 11/09/2022   (No Known Allergies)    Family History  Problem Relation Age of Onset   Diabetes Mother    Hypertension Mother    Stroke Mother    Other Mother        myeloproliferative disorder   Alzheimer's disease Father    Hypertension Father    Diabetes Maternal Grandfather    Hypertension Maternal Grandmother  Stroke Maternal Grandmother     Social History   Socioeconomic History   Marital status: Married    Spouse name: Not on file   Number of children: Not on file   Years of education: Not on file   Highest education level: Not on file  Occupational History   Not on file  Tobacco Use   Smoking status: Never   Smokeless tobacco: Never  Vaping Use   Vaping Use: Never used  Substance and Sexual Activity   Alcohol use: Yes    Comment: rarely   Drug use: No   Sexual activity: Not Currently    Partners: Male    Birth control/protection: Post-menopausal  Other Topics Concern   Not on file  Social History Narrative   Not on file   Social Determinants of Health   Financial  Resource Strain: Not on file  Food Insecurity: Not on file  Transportation Needs: Not on file  Physical Activity: Not on file  Stress: Not on file  Social Connections: Not on file  Intimate Partner Violence: Not on file    Review of Systems: See HPI, otherwise negative ROS  Physical Exam: BP 128/74 (BP Location: Right Arm, Patient Position: Sitting, Cuff Size: Large)   Pulse 68   Temp 97.9 F (36.6 C) (Oral)   Ht '5\' 7"'$  (1.702 m)   Wt 237 lb 3.2 oz (107.6 kg)   LMP 10/22/2008   SpO2 98%   BMI 37.15 kg/m  General:   Alert,  Well-developed, well-nourished, pleasant and cooperative in NAD Neck:  Supple; no masses or thyromegaly. No significant cervical adenopathy. Lungs:  Clear throughout to auscultation.   No wheezes, crackles, or rhonchi. No acute distress. Heart:  Regular rate and rhythm; no murmurs, clicks, rubs,  or gallops. Abdomen: Non-distended, normal bowel sounds.  Soft and nontender without appreciable mass or hepatosplenomegaly.   Impression/Plan: 67 year old lady with mixed IBS although a slant towards constipation more recently.  She is on a sporadic bowel regimen.  She is also alternating  her Ozempic dosing regimen which may be affecting her bowel function.  Longstanding diabetes may affect her enteric nervous system as  discussed. She needs a one-time screen for celiac disease.   Recommendations:  Stop Citrucel  Stop all laxatives you may be taking  Try and take the same dose of Ozempic regularly.  Prescription for Levsin sublingual 0.125 mg take 1 under the tongue before meals and at bedtime if you have abdominal cramps and diarrhea.  This is only for abdominal cramps and diarrhea not constipation.  Dispense 30 with 3 refills.  Begin Benefiber 2 tablespoons every day.  Take every day without fail.  Screen for celiac disease (TTG IgA and total serum IgA)  Keep a stool diary  Telephone follow-up in 1 month     Notice: This dictation was prepared with  Dragon dictation along with smaller phrase technology. Any transcriptional errors that result from this process are unintentional and may not be corrected upon review.

## 2022-11-09 NOTE — Patient Instructions (Addendum)
It was good to see you again today!  After our discussion, I feel that you have irritable bowel syndrome mixed pattern with a predominance of constipation.  There is some trial and error in coming up with a treatment regimen.  There is no cure for IBS as discussed.  However, our goal of treatment is for your good days to far outnumber your bad days.  My initial plan of management is as follows:  Stop Citrucel  Stop all laxatives you may be taking  Try and take the same dose of Ozempic regularly.  Prescription for Levsin sublingual 0.125 mg take 1 under the tongue before meals and at bedtime if you have abdominal cramps and diarrhea.  This is only for abdominal cramps and diarrhea not constipation.  Dispense 30 with 3 refills.  Begin Benefiber 2 tablespoons every day.  Take every day without fail.  Screen for celiac disease (TTG IgA and total serum IgA)  Keep a stool diary  Telephone follow-up in 1 month  Office visit in 3 months

## 2022-11-12 LAB — TISSUE TRANSGLUTAMINASE, IGA: (tTG) Ab, IgA: <1 U/mL

## 2022-11-12 LAB — IGA: Immunoglobulin A: 186 mg/dL (ref 70–320)

## 2022-11-14 ENCOUNTER — Ambulatory Visit (INDEPENDENT_AMBULATORY_CARE_PROVIDER_SITE_OTHER): Payer: Medicare Other | Admitting: Psychiatry

## 2022-11-14 DIAGNOSIS — F411 Generalized anxiety disorder: Secondary | ICD-10-CM | POA: Diagnosis not present

## 2022-11-14 NOTE — Progress Notes (Signed)
IN- PERSON  THERAPIST PROGRESS NOTE  Session Time: Wednesday 11/14/2022 10:10 AM - 11:00 AM   Participation Level: Active  Behavioral Response: CasualAlertAnxious  Type of Therapy: Individual Therapy Treatment Goals addressed: Patient will score less than 5 on the Generalized Anxiety Disorder 7 Scale (GAD-7Report a decrease in anxiety symptoms as evidenced by an overall reduction in anxiety score by a minimum of 25% on the Generalized Anxiety Disorder Scale   ProgressTowards Goals: Progressing  Interventions: CBT and Supportive  Summary: Angela Norman is a 67 y.o. female who is referred for services by her holistic doctor. Pt reports no psychiatric hospitalizations. She reports no previous involvement in therapy.  Patient states wanting to talk with someone to learn how to cope with anxiety.  She reports anger and resentment regarding her husband to whom she has been marr   ied for 41 years.  She reports dwelling on the past.  She also reports adjustment issues related to retiring on June 1.  Current symptoms include irritability, ruminating about the past, restlessness, muscle tension, worrying about family and the future especially daughter's health.  Patient last was seen for appointment about 2 weeks ago.  She reports minimal symptoms of anxiety as reflected in the GAD-7.   She reports decreased restlessness and has been more content staying at home.  She has been doing puzzles, reading, and organizing family photos.  She plans to resume social involvement with some of her friends next week.  She continues to worry  and states spending too much time thinking about the past and worrying about the future. She reports she and husband are participating in more activities together and says this has been good. However, she continues  to report anger and resentment regarding husband. She discloses today husband informed her 9 years ago he had an affair 17 years prior. She reports having a very  emotional reaction to husband in that moment.  However, they never discussed the issue as patient feared saying anything as husband was recovering from depression.  She feared this would trigger a relapse.  She states still thinking about the infidelity and her husband's decision to inform her about it.  She reports ruminating thoughts about husband's behavior and expresses hurt, anger, disappointment, and resentment.   Suicidal/Homicidal: Nowithout intent/plan  Therapist Response: Reviewed symptoms, administered GAD-7, discussed results, discussed stressors, facilitated expression of thoughts and feelings, validated feelings, praised and reinforced patient's efforts in disclosing and discussing information, began to assist patient identify underlying feelings beneath anger  Plan: Return again in 2 weeks.  Diagnosis: Generalized anxiety disorder  Collaboration of Care: Other none needed at this session  Patient/Guardian was advised Release of Information must be obtained prior to any record release in order to collaborate their care with an outside provider. Patient/Guardian was advised if they have not already done so to contact the registration department to sign all necessary forms in order for [place] to release information regarding their care.   Consent: Patient/Guardian gives verbal consent for treatment and assignment of benefits for services provided during this visit. Patient/Guardian expressed understanding and agreed to proceed.   Alonza Smoker, LCSW 11/14/2022

## 2022-11-22 ENCOUNTER — Other Ambulatory Visit (HOSPITAL_COMMUNITY): Payer: Self-pay | Admitting: Obstetrics and Gynecology

## 2022-11-22 DIAGNOSIS — Z1231 Encounter for screening mammogram for malignant neoplasm of breast: Secondary | ICD-10-CM

## 2022-11-28 ENCOUNTER — Ambulatory Visit (INDEPENDENT_AMBULATORY_CARE_PROVIDER_SITE_OTHER): Payer: Medicare Other | Admitting: Psychiatry

## 2022-11-28 DIAGNOSIS — F411 Generalized anxiety disorder: Secondary | ICD-10-CM | POA: Diagnosis not present

## 2022-11-28 NOTE — Progress Notes (Signed)
IN- PERSON  THERAPIST PROGRESS NOTE  Session Time: Wednesday 11/28/2022 1:05 PM - 1:55 PM   Participation Level: Active  Behavioral Response: CasualAlertAnxious  Type of Therapy: Individual Therapy Treatment Goals addressed: Patient will score less than 5 on the Generalized Anxiety Disorder 7 Scale (GAD-7Report a decrease in anxiety symptoms as evidenced by an overall reduction in anxiety score by a minimum of 25% on the Generalized Anxiety Disorder Scale   ProgressTowards Goals: Progressing  Interventions: CBT and Supportive  Summary: Angela Norman is a 67 y.o. female who is referred for services by her holistic doctor. Pt reports no psychiatric hospitalizations. She reports no previous involvement in therapy.  Patient states wanting to talk with someone to learn how to cope with anxiety.  She reports anger and resentment regarding her husband to whom she has been marr   ied for 41 years.  She reports dwelling on the past.  She also reports adjustment issues related to retiring on June 1.  Current symptoms include irritability, ruminating about the past, restlessness, muscle tension, worrying about family and the future especially daughter's health.  Patient last was seen for appointment about 2 weeks ago.  She reports continued decreased symptoms of anxiety as reflected in the GAD-7.   She reports continued decreased restlessness and increased behavioral activation/socialization.  She participated with her book club, went out to lunch with a friend, and socialized with her children.  Patient also reports self nurture but recently getting a massage.  She also has been taking care of household task.  Patient is excited about recently learned her daughter is pregnant with twins.  She reports trying to let things go regarding husband's past behavior but also is experiencing increased irritability regarding interaction with husband.  She reports recently seeing old family videos of husband with  her children when they were young.  This triggered negative memories and anger regarding husband not being involved with the children the way patient wanted him to be at that time.  She verbalizes self blame for his behavior as well as irritability with self for marrying husband.   Suicidal/Homicidal: Nowithout intent/plan  Therapist Response: Reviewed symptoms, praised and reinforced patient's increased behavioral activation/socialization, discussed effects increased behavioral activation/socialization, assisted patient identify triggers of irritability, assisted patient identify and examine her automatic thoughts regarding marrying her husband, discussed hindsight bias, assisted patient identify/challenge/and replace thoughts evoking inappropriate self blame with more rational thoughts, assisted patient identify underlying feelings beneath anger including hurt and disappointment.  Plan: Return again in 2 weeks.  Diagnosis: Generalized anxiety disorder  Collaboration of Care: Other none needed at this session  Patient/Guardian was advised Release of Information must be obtained prior to any record release in order to collaborate their care with an outside provider. Patient/Guardian was advised if they have not already done so to contact the registration department to sign all necessary forms in order for [place] to release information regarding their care.   Consent: Patient/Guardian gives verbal consent for treatment and assignment of benefits for services provided during this visit. Patient/Guardian expressed understanding and agreed to proceed.   Alonza Smoker, LCSW 11/28/2022

## 2022-11-29 NOTE — Progress Notes (Signed)
67 y.o. G16P2002 Married Caucasian female here for breast and pelvic exam and pap.  Denies vaginal bleeding.   No GYN concerns.  No urinary incontinence.  Voiding well.  Some constipation.  Improved with Benefiber. No fecal incontinence.   Retired in June.  Started seeing a counselor in July to help with anxiety and the transition.   PCP:   Allyn Kenner, MD Dr. Velia Meyer   Patient's last menstrual period was 10/22/2008.           Sexually active: No.  The current method of family planning is post menopausal status.    Exercising: Yes.     Walking, hand weights, dancing, chair aerobics Smoker:  no  Health Maintenance: Pap:  03/24/18 neg;HR HPV neg History of abnormal Pap:  no MMG:  12/10/22 Breast Density Category A, BI-RADS CATEGORY 1 Neg Colonoscopy:  09/06/22 - precancerous polyp. Per patient.  Has IBS. BMD:   06/11/18  Result  normal TDaP:  2013, possibly 2023? Gardasil:   no HIV: n/a Hep C: n/a Screening Labs:  PCP   reports that she has never smoked. She has never used smokeless tobacco. She reports current alcohol use. She reports that she does not use drugs.  Past Medical History:  Diagnosis Date   Anxiety    Arthritis    Basal cell carcinoma    Charcot's joint of foot    bilateral   Diabetes mellitus without complication (HCC)    Fibromyalgia    Neuropathy     Past Surgical History:  Procedure Laterality Date   ACHILLES TENDON REPAIR Left    bone spur Left    --left foot   BREAST SURGERY     benign breast bx--left--fatty tissue   CARPAL TUNNEL RELEASE Bilateral    COLONOSCOPY     COLONOSCOPY WITH PROPOFOL N/A 09/06/2022   Procedure: COLONOSCOPY WITH PROPOFOL;  Surgeon: Daneil Dolin, MD;  Location: [facility];  Service: Endoscopy;  Laterality: N/A;  10:30am, asa 2   DILATION AND CURETTAGE OF UTERUS  2011   for cervical polyp   FOOT SURGERY Left 03/2018   reconstructive surgery   POLYPECTOMY  09/06/2022   Procedure: POLYPECTOMY;  Surgeon: Daneil Dolin, MD;  Location: [facility];  Service: Endoscopy;;    Current Outpatient Medications  Medication Sig Dispense Refill   desonide (DESOWEN) 0.05 % cream Apply 1 Application topically 2 (two) times daily as needed (on eye lids).     gabapentin (NEURONTIN) 600 MG tablet Take 300-600 mg by mouth See admin instructions. Take 300 mg in the afternoon and 600 mg at bedtime     hyoscyamine (LEVSIN SL) 0.125 MG SL tablet Place 1 tablet (0.125 mg total) under the tongue every 4 (four) hours as needed. 30 tablet 3   metolazone (ZAROXOLYN) 5 MG tablet Take 5 mg by mouth once a week.     NON FORMULARY Take 1 tablet by mouth daily. Carbon 98 BX--a fish oil.  Pt. Takes 5x/week     ONETOUCH VERIO test strip daily.     OVER THE COUNTER MEDICATION Take 5 tablets by mouth daily. Chromic Chloride     Polyethyl Glycol-Propyl Glycol (SYSTANE) 0.4-0.3 % SOLN Place 1 drop into both eyes daily as needed (dry eyes). PF orginal     Semaglutide,0.25 or 0.5MG/DOS, (OZEMPIC, 0.25 OR 0.5 MG/DOSE,) 2 MG/3ML SOPN Inject 1 mg into the skin once a week.     No current facility-administered medications for this visit.  Family History  Problem Relation Age of Onset   Diabetes Mother    Hypertension Mother    Stroke Mother    Other Mother        myeloproliferative disorder   Alzheimer's disease Father    Hypertension Father    Diabetes Maternal Grandfather    Hypertension Maternal Grandmother    Stroke Maternal Grandmother     Review of Systems  All other systems reviewed and are negative.   Exam:   BP 128/68 (BP Location: Right Arm, Patient Position: Sitting, Cuff Size: Large)   Pulse 99   Ht 5' 5"$  (1.651 m)   Wt 236 lb (107 kg)   LMP 10/22/2008   SpO2 99%   BMI 39.27 kg/m     General appearance: alert, cooperative and appears stated age Head: normocephalic, without obvious abnormality, atraumatic Neck: no adenopathy, supple, symmetrical, trachea midline and thyroid normal to inspection and  palpation Lungs: clear to auscultation bilaterally Breasts: normal appearance, no masses or tenderness, No nipple retraction or dimpling, No nipple discharge or bleeding, No axillary adenopathy Heart: regular rate and rhythm Abdomen: soft, non-tender; no masses, no organomegaly Extremities: extremities normal, atraumatic, no cyanosis or edema Skin: skin color, texture, turgor normal. No rashes or lesions Lymph nodes: cervical, supraclavicular, and axillary nodes normal. Neurologic: grossly normal  Pelvic: External genitalia:  no lesions              No abnormal inguinal nodes palpated.              Urethra:  normal appearing urethra with no masses, tenderness or lesions              Bartholins and Skenes: normal                 Vagina: normal appearing vagina with normal color and discharge, no lesions.  Second degree cystocele, first degree uterine prolapse, and first degree rectocele.              Cervix: no lesions              Pap taken:  yes Bimanual Exam:  Uterus:  normal size, contour, position, consistency, mobility, non-tender              Adnexa: no mass, fullness, tenderness              Rectal exam: yes.  Confirms.              Anus:  normal sphincter tone, no lesions  Chaperone was present for exam:  Raquel Sarna  Assessment:   Well woman visit with gynecologic exam. Incomplete uterovaginal prolapse.  Cervical cancer screening.   Plan: Mammogram screening discussed. Self breast awareness reviewed. Pap and HR HPV as above. Guidelines for Calcium, Vitamin D, regular exercise program including cardiovascular and weight bearing exercise. We discussed uterovaginal prolapse, risk factors and treatment options of observation, pessary use, pelvic floor therapy and surgical care.  Patient would like to follow with observation management.  Follow up  in 2 years and prn.   After visit summary provided.   20 min  total time was spent for this patient encounter, including  preparation, face-to-face counseling with the patient, coordination of care, and documentation of the encounter in addition to doing breast and pelvic exam and pap.

## 2022-12-06 ENCOUNTER — Telehealth: Payer: Self-pay

## 2022-12-06 NOTE — Telephone Encounter (Signed)
Pt called with an update. Pt states that the benefiber is working for her bowel issues.

## 2022-12-10 ENCOUNTER — Encounter (HOSPITAL_COMMUNITY): Payer: Self-pay

## 2022-12-10 ENCOUNTER — Ambulatory Visit (HOSPITAL_COMMUNITY)
Admission: RE | Admit: 2022-12-10 | Discharge: 2022-12-10 | Disposition: A | Discharge status: Home / Self Care | Payer: Medicare Other | Source: Ambulatory Visit | Attending: Obstetrics and Gynecology | Admitting: Obstetrics and Gynecology

## 2022-12-10 DIAGNOSIS — Z1231 Encounter for screening mammogram for malignant neoplasm of breast: Secondary | ICD-10-CM | POA: Insufficient documentation

## 2022-12-12 ENCOUNTER — Encounter: Payer: Self-pay | Admitting: Obstetrics and Gynecology

## 2022-12-12 ENCOUNTER — Ambulatory Visit (INDEPENDENT_AMBULATORY_CARE_PROVIDER_SITE_OTHER): Payer: Medicare Other | Admitting: Obstetrics and Gynecology

## 2022-12-12 ENCOUNTER — Other Ambulatory Visit (HOSPITAL_COMMUNITY)
Admission: RE | Admit: 2022-12-12 | Discharge: 2022-12-12 | Disposition: A | Discharge status: Home / Self Care | Payer: Medicare Other | Source: Ambulatory Visit | Attending: Obstetrics and Gynecology | Admitting: Obstetrics and Gynecology

## 2022-12-12 VITALS — BP 128/68 | HR 99 | Ht 65.0 in | Wt 236.0 lb

## 2022-12-12 DIAGNOSIS — Z124 Encounter for screening for malignant neoplasm of cervix: Secondary | ICD-10-CM

## 2022-12-12 DIAGNOSIS — Z01419 Encounter for gynecological examination (general) (routine) without abnormal findings: Secondary | ICD-10-CM | POA: Diagnosis not present

## 2022-12-12 DIAGNOSIS — Z1151 Encounter for screening for human papillomavirus (HPV): Secondary | ICD-10-CM | POA: Insufficient documentation

## 2022-12-12 DIAGNOSIS — N812 Incomplete uterovaginal prolapse: Secondary | ICD-10-CM | POA: Diagnosis not present

## 2022-12-12 NOTE — Patient Instructions (Addendum)
EXERCISE AND DIET:  We recommended that you start or continue a regular exercise program for good health. Regular exercise means any activity that makes your heart beat faster and makes you sweat.  We recommend exercising at least 30 minutes per day at least 3 days a week, preferably 4 or 5.  We also recommend a diet low in fat and sugar.  Inactivity, poor dietary choices and obesity can cause diabetes, heart attack, stroke, and kidney damage, among others.    ALCOHOL AND SMOKING:  Women should limit their alcohol intake to no more than 7 drinks/beers/glasses of wine (combined, not each!) per week. Moderation of alcohol intake to this level decreases your risk of breast cancer and liver damage. And of course, no recreational drugs are part of a healthy lifestyle.  And absolutely no smoking or even second hand smoke. Most people know smoking can cause heart and lung diseases, but did you know it also contributes to weakening of your bones? Aging of your skin?  Yellowing of your teeth and nails?  CALCIUM AND VITAMIN D:  Adequate intake of calcium and Vitamin D are recommended.  The recommendations for exact amounts of these supplements seem to change often, but generally speaking 600 mg of calcium (either carbonate or citrate) and 800 units of Vitamin D per day seems prudent. Certain women may benefit from higher intake of Vitamin D.  If you are among these women, your doctor will have told you during your visit.    PAP SMEARS:  Pap smears, to check for cervical cancer or precancers,  have traditionally been done yearly, although recent scientific advances have shown that most women can have pap smears less often.  However, every woman still should have a physical exam from her gynecologist every year. It will include a breast check, inspection of the vulva and vagina to check for abnormal growths or skin changes, a visual exam of the cervix, and then an exam to evaluate the size and shape of the uterus and  ovaries.  And after 69 years of age, a rectal exam is indicated to check for rectal cancers. We will also provide age appropriate advice regarding health maintenance, like when you should have certain vaccines, screening for sexually transmitted diseases, bone density testing, colonoscopy, mammograms, etc.   MAMMOGRAMS:  All women over 40 years old should have a yearly mammogram. Many facilities now offer a "3D" mammogram, which may cost around $50 extra out of pocket. If possible,  we recommend you accept the option to have the 3D mammogram performed.  It both reduces the number of women who will be called back for extra views which then turn out to be normal, and it is better than the routine mammogram at detecting truly abnormal areas.    COLONOSCOPY:  Colonoscopy to screen for colon cancer is recommended for all women at age 50.  We know, you hate the idea of the prep.  We agree, BUT, having colon cancer and not knowing it is worse!!  Colon cancer so often starts as a polyp that can be seen and removed at colonscopy, which can quite literally save your life!  And if your first colonoscopy is normal and you have no family history of colon cancer, most women don't have to have it again for 10 years.  Once every ten years, you can do something that may end up saving your life, right?  We will be happy to help you get it scheduled when you are ready.    Be sure to check your insurance coverage so you understand how much it will cost.  It may be covered as a preventative service at no cost, but you should check your particular policy.      Pelvic Organ Prolapse Pelvic organ prolapse is a condition in women that involves the stretching, bulging, or dropping of pelvic organs into an abnormal position, past the opening of the vagina. It happens when the muscles and tissues that surround and support pelvic structures become weak or stretched. Pelvic organ prolapse can involve the: Vagina (vaginal  prolapse). Uterus (uterine prolapse). Bladder (cystocele). Rectum (rectocele). Intestines (enterocele). When organs other than the vagina are involved, they often bulge into the vagina or protrude from the vagina, depending on how severe the prolapse is. What are the causes? This condition may be caused by: Pregnancy, labor, and childbirth. Past pelvic surgery. Lower levels of the hormone estrogen due to menopause. Consistently lifting more than 50 lb (23 kg). Obesity. Long-term difficulty passing stool (chronic constipation). Long-term, or chronic, cough. Fluid buildup in the abdomen due to certain conditions. What are the signs or symptoms? Symptoms of this condition include: Leaking a little urine (loss of bladder control) when you cough, sneeze, strain, and exercise (stress incontinence). This may be worse immediately after childbirth. It may gradually improve over time. Feeling pressure in your pelvis or vagina. This pressure may increase when you cough or when you are passing stool. A bulge that protrudes from the opening of your vagina. Difficulty passing urine or stool. Pain in your lower back. Pain or discomfort during sex, or decreased interest in sex. Repeated bladder infections (urinary tract infections). Difficulty inserting a tampon. In some people, this condition causes no symptoms. How is this diagnosed? This condition may be diagnosed based on a vaginal and rectal exam. During the exam, you may be asked to cough and strain while you are lying down, sitting, and standing up. Your health care provider will determine if other tests are required, such as bladder function tests. How is this treated? Treatment for this condition may depend on your symptoms. Treatment may include: Lifestyle changes, such as drinking plenty of fluids and eating foods that are high in fiber. Emptying your bladder at scheduled times (bladder training therapy). This can help reduce or avoid  urinary incontinence. Estrogen. This may help mild prolapse by increasing the strength and tone of pelvic floor muscles. Kegel exercises. These may help mild cases of prolapse by strengthening and tightening the muscles of the pelvic floor. A soft, flexible device that helps support the vaginal walls and keep pelvic organs in place (pessary). This is inserted into your vagina by your health care provider. Surgery. This is often the only form of treatment for severe prolapse. Follow these instructions at home: Eating and drinking Avoid drinking beverages that contain caffeine or alcohol. Increase your intake of high-fiber foods to decrease constipation and straining during bowel movements. Activity Lose weight if recommended by your health care provider. Avoid heavy lifting and straining with exercise and work. Do not hold your breath when you perform mild to moderate lifting and exercise activities. Limit your activities as directed by your health care provider. Do Kegel exercises as directed by your health care provider. To do this: Squeeze your pelvic floor muscles tight. You should feel a tight lift in your rectal area and a tightness in your vaginal area. Keep your stomach, buttocks, and legs relaxed. Hold the muscles tight for up to 10 seconds. Then relax  your muscles. Repeat this exercise 50 times a day, or as much as told by your health care provider. Continue to do this exercise for at least 4-6 weeks, or for as long as told by your health care provider. General instructions Take over-the-counter and prescription medicines only as told by your health care provider. Wear a sanitary pad or adult diapers if you have urinary incontinence. If you have a pessary, take care of it as told by your health care provider. Keep all follow-up visits. This is important. Contact a health care provider if you: Have symptoms that interfere with your daily activities or sex life. Need medicine to help  with the discomfort. Notice bleeding from your vagina that is not related to your menstrual period. Have a fever. Have pain or bleeding when you urinate. Have bleeding when you pass stool. Pass urine when you have sex. Have chronic constipation. Have a pessary that falls out. Have a foul-smelling vaginal discharge. Have an unusual, low pain in your abdomen. Get help right away if you: Cannot pass urine. Summary Pelvic organ prolapse is the stretching, bulging, or dropping of pelvic organs into an abnormal position. It happens when the muscles and tissues that surround and support pelvic structures become weak or stretched. When organs other than the vagina are involved, they often bulge into the vagina or protrude from it, depending on how severe the prolapse is. In most cases, this condition needs to be treated only if it produces symptoms. Treatment may include lifestyle changes, estrogen, Kegel exercises, pessary insertion, or surgery. Avoid heavy lifting and straining with exercise and work. Do not hold your breath when you perform mild to moderate lifting and exercise activities. Limit your activities as directed by your health care provider. This information is not intended to replace advice given to you by your health care provider. Make sure you discuss any questions you have with your health care provider. Document Revised: 04/04/2020 Document Reviewed: 04/04/2020 Elsevier Patient Education  [place].

## 2022-12-13 ENCOUNTER — Ambulatory Visit (INDEPENDENT_AMBULATORY_CARE_PROVIDER_SITE_OTHER): Payer: Medicare Other | Admitting: Psychiatry

## 2022-12-13 DIAGNOSIS — F411 Generalized anxiety disorder: Secondary | ICD-10-CM | POA: Diagnosis not present

## 2022-12-13 LAB — CYTOLOGY - PAP
Comment: NEGATIVE
Diagnosis: NEGATIVE
High risk HPV: NEGATIVE

## 2022-12-13 NOTE — Progress Notes (Signed)
IN- PERSON  THERAPIST PROGRESS NOTE  Session Time: Thursday  2/721/2024 10:07 AM - 10:58 AM   Participation Level: Active  Behavioral Response: CasualAlertAnxious  Type of Therapy: Individual Therapy Treatment Goals addressed: Patient will score less than 5 on the Generalized Anxiety Disorder 7 Scale (GAD-7Report a decrease in anxiety symptoms as evidenced by an overall reduction in anxiety score by a minimum of 25% on the Generalized Anxiety Disorder Scale   ProgressTowards Goals: Progressing  Interventions: CBT and Supportive  Summary: Angela Norman is a 67 y.o. female who is referred for services by her holistic doctor. Pt reports no psychiatric hospitalizations. She reports no previous involvement in therapy.  Patient states wanting to talk with someone to learn how to cope with anxiety.  She reports anger and resentment regarding her husband to whom she has been marr   ied for 41 years.  She reports dwelling on the past.  She also reports adjustment issues related to retiring on June 1.  Current symptoms include irritability, ruminating about the past, restlessness, muscle tension, worrying about family and the future especially daughter's health.  Patient last was seen for appointment about 2 weeks ago.  She reports continued decreased symptoms of anxiety as reflected in the GAD-7.   She reports doing very well last week but experiencing increased stress and anxiety this week.  Triggers include gynecological visit and recently learning one of her college friends recently had a stroke.  She reports continued worry about her daughter and her daughter's pregnancy.  She is pleased she has maintained involvement in activities including meeting friends for lunch or coffee, attending a Bible study, and maintaining contact with her children/grandchildren.  She expresses increased anxiety/irritation/frustration with husband reports this can be triggered by small things such asannoying habits.   Per patient's report, she continues to experience resentment and anger regarding husband's past behavior.  She verbalizes less self blame for her husband's behavior.   .   Suicidal/Homicidal: Nowithout intent/plan  Therapist Response: Reviewed symptoms, praised and reinforced patient's increased behavioral activation/socialization, discussed effects increased behavioral activation/socialization, assisted patient identify triggers of irritability, provided psychoeducation on threat/response system, reviewed rationale for and encouraged patient to resume practicing deep breathing to help trigger relaxation response, develop plan with patient to keep anxiety log especially regarding interaction with husband in preparation for next session assisted patient identify and examine her automatic thoughts regarding marrying her husband, discussed hindsight bias, assisted patient identify/challenge/and replace thoughts evoking inappropriate self blame with more rational thoughts, assisted patient identify underlying feelings beneath anger including hurt and disappointment.  Plan: Return again in 2 weeks.  Diagnosis: Generalized anxiety disorder  Collaboration of Care: Other none needed at this session  Patient/Guardian was advised Release of Information must be obtained prior to any record release in order to collaborate their care with an outside provider. Patient/Guardian was advised if they have not already done so to contact the registration department to sign all necessary forms in order for [place] to release information regarding their care.   Consent: Patient/Guardian gives verbal consent for treatment and assignment of benefits for services provided during this visit. Patient/Guardian expressed understanding and agreed to proceed.   Alonza Smoker, LCSW 12/13/2022

## 2022-12-20 ENCOUNTER — Encounter: Payer: Self-pay | Admitting: Radiology

## 2022-12-27 ENCOUNTER — Ambulatory Visit (INDEPENDENT_AMBULATORY_CARE_PROVIDER_SITE_OTHER): Payer: Medicare Other | Admitting: Psychiatry

## 2022-12-27 ENCOUNTER — Encounter (HOSPITAL_COMMUNITY): Payer: Self-pay

## 2022-12-27 DIAGNOSIS — F411 Generalized anxiety disorder: Secondary | ICD-10-CM

## 2022-12-27 NOTE — Progress Notes (Signed)
IN- PERSON  THERAPIST PROGRESS NOTE  Session Time: Thursday  12/27/2022 10:14 AM - 11:10 AM   Participation Level: Active  Behavioral Response: CasualAlertAnxious  Type of Therapy: Individual Therapy Treatment Goals addressed: Patient will score less than 5 on the Generalized Anxiety Disorder 7 Scale (GAD-7Report a decrease in anxiety symptoms as evidenced by an overall reduction in anxiety score by a minimum of 25% on the Generalized Anxiety Disorder Scale   ProgressTowards Goals: Progressing  Interventions: CBT and Supportive  Summary: Angela Norman is a 67 y.o. female who is referred for services by her holistic doctor. Pt reports no psychiatric hospitalizations. She reports no previous involvement in therapy.  Patient states wanting to talk with someone to learn how to cope with anxiety.  She reports anger and resentment regarding her husband to whom she has been marr   ied for 41 years.  She reports dwelling on the past.  She also reports adjustment issues related to retiring on June 1.  Current symptoms include irritability, ruminating about the past, restlessness, muscle tension, worrying about family and the future especially daughter's health.  Patient last was seen for appointment about 2 weeks ago.   She reports continued minimal symptoms of anxiety as reflected in the GAD-7.   She reports decreased irritability with husband regarding little things.  She reports having more realistic expectations of husband and limits some things go.  However, she continues to report frequent thoughts about the way husband has treated her and her children in the past and this results in anger.  Patient maintains involvement in activities including attending church, she is pleased she has made a new friend at church and is looking forward to attending a church event with friend in the near future.  Patient continues to have frequent contact with her children.  She also continues to enjoy  reading. Suicidal/Homicidal: Nowithout intent/plan  Therapist Response: Reviewed symptoms, administered GAD-7, discussed results, praised and reinforced patient's efforts to have more realistic expectations regarding husband's current behaviors, discussed effects, praised and reinforced patient's initiative and making a new friend at church, discussed effects on her mood/thoughts/behavior, processed anxiety log and assisted patient identify patterns in her interaction with husband, facilitated patient expressing thoughts and feelings about her husband's past behavior, discussed the role of emotions, assisted patient identify and verbalize underlying feelings beneath anger regarding husband's past behavior, introduced feelings wheel to patient, developed plan with patient to use feelings wheel to identify her emotions, assisted patient examine her thought patterns regarding husband's past behavior, assisted patient replace should/ought/must statements with wish, prefer, developed plan with patient to use replacement treatment Plan: Return again in 2 weeks.  Diagnosis: Generalized anxiety disorder  Collaboration of Care: Other none needed at this session  Patient/Guardian was advised Release of Information must be obtained prior to any record release in order to collaborate their care with an outside provider. Patient/Guardian was advised if they have not already done so to contact the registration department to sign all necessary forms in order for [place] to release information regarding their care.   Consent: Patient/Guardian gives verbal consent for treatment and assignment of benefits for services provided during this visit. Patient/Guardian expressed understanding and agreed to proceed.   Alonza Smoker, LCSW 12/27/2022

## 2022-12-28 ENCOUNTER — Encounter: Payer: Self-pay | Admitting: Internal Medicine

## 2023-01-15 ENCOUNTER — Ambulatory Visit (INDEPENDENT_AMBULATORY_CARE_PROVIDER_SITE_OTHER): Payer: Medicare Other | Admitting: Psychiatry

## 2023-01-15 DIAGNOSIS — F411 Generalized anxiety disorder: Secondary | ICD-10-CM | POA: Diagnosis not present

## 2023-01-15 NOTE — Progress Notes (Unsigned)
IN- PERSON  THERAPIST PROGRESS NOTE  Session Time: Tuesday   01/15/2023 2:10 PM - 3:05 PM        Participation Level: Active  Behavioral Response: CasualAlertAnxious  Type of Therapy: Individual Therapy Treatment Goals addressed: Patient will score less than 5 on the Generalized Anxiety Disorder 7 Scale (GAD-7Report a decrease in anxiety symptoms as evidenced by an overall reduction in anxiety score by a minimum of 25% on the Generalized Anxiety Disorder Scale   ProgressTowards Goals: Progressing  Interventions: CBT and Supportive  Summary: Angela Norman is a 67 y.o. female who is referred for services by her holistic doctor. Pt reports no psychiatric hospitalizations. She reports no previous involvement in therapy.  Patient states wanting to talk with someone to learn how to cope with anxiety.  She reports anger and resentment regarding her husband to whom she has been marr   ied for 41 years.  She reports dwelling on the past.  She also reports adjustment issues related to retiring on June 1.  Current symptoms include irritability, ruminating about the past, restlessness, muscle tension, worrying about family and the future especially daughter's health.  Patient last was seen for appointment about 2 weeks ago.   She reports continued minimal symptoms of anxiety.  However, she reports being more aware of restlessness in the evenings when she is not involved in some type of activity.  However, she reports recognizing her thought patterns about being involved in activity, gave herself permission to relax,and enjoy watching TV.  She has been using anxiety log and reports using replacement statements regarding her thought patterns about husband's past behavior.  Per patient's report, this has been helpful and she is not dwelling on thoughts like she wants did.  She also reports decreased irritability with husband and being able to overlook some of his present behaviors.  Patient reports concern  about family issues including son being injured in a biking accident and having surgery.  She also reports concern about daughter's recent medical test regarding the progression of her pregnancy.  Patient does not feel overwhelmed but reports having sleep difficulty due to constant thoughts about her concerns.  Patient maintains involvement in activities including performing household task, engaging in hobbies, and socializing with friends/family.    Suicidal/Homicidal: Nowithout intent/plan  Therapist Response: Reviewed symptoms,  praised and reinforced patient's efforts to use replacement statements regarding husband's past and current behaviors, discussed effects, praised and reinforced patient's continued behavioral activation and socialization, discussed effects, praised and reinforced patient's recognition of some of her thought patterns and using helpful coping strategies to intervene, discussed stressors, facilitated expression of thoughts and feelings, validated feelings, discussed rationale for and assisted patient practice mindfulness activity (leaves on a stream) to cope with ruminating thoughts, developed plan with patient to practice activity daily, checked out interactive audio activity to patient and provided with access code to assist her in her efforts   Plan: Return again in 2 weeks.  Diagnosis: Generalized anxiety disorder  Collaboration of Care: Other none needed at this session  Patient/Guardian was advised Release of Information must be obtained prior to any record release in order to collaborate their care with an outside provider. Patient/Guardian was advised if they have not already done so to contact the registration department to sign all necessary forms in order for [place] to release information regarding their care.   Consent: Patient/Guardian gives verbal consent for treatment and assignment of benefits for services provided during this visit. Patient/Guardian expressed  understanding  and agreed to proceed.   Alonza Smoker, LCSW 01/15/2023

## 2023-02-04 ENCOUNTER — Ambulatory Visit (INDEPENDENT_AMBULATORY_CARE_PROVIDER_SITE_OTHER): Payer: Medicare Other | Admitting: Psychiatry

## 2023-02-04 DIAGNOSIS — F411 Generalized anxiety disorder: Secondary | ICD-10-CM

## 2023-02-04 NOTE — Progress Notes (Signed)
IN- PERSON  THERAPIST PROGRESS NOTE  Session Time: Monday 02/04/2023  10:11 AM -  11:00 AM        Participation Level: Active  Behavioral Response: CasualAlertAnxious  Type of Therapy: Individual Therapy Treatment Goals addressed: Patient will score less than 5 on the Generalized Anxiety Disorder 7 Scale (GAD-7Report a decrease in anxiety symptoms as evidenced by an overall reduction in anxiety score by a minimum of 25% on the Generalized Anxiety Disorder Scale   ProgressTowards Goals: Progressing  Interventions: CBT and Supportive  Summary: Angela Norman is a 67 y.o. female who is referred for services by her holistic doctor. Pt reports no psychiatric hospitalizations. She reports no previous involvement in therapy.  Patient states wanting to talk with someone to learn how to cope with anxiety.  She reports anger and resentment regarding her husband to whom she has been marr   ied for 41 years.  She reports dwelling on the past.  She also reports adjustment issues related to retiring on June 1.  Current symptoms include irritability, ruminating about the past, restlessness, muscle tension, worrying about family and the future especially daughter's health.  Patient last was seen for appointment about 2 weeks ago.   She reports decreased nervousness but continued worries about a variety of issues daily.  She expresses concern regarding her daughter's pregnancy, upcoming baby shower, and her son's recent arm injury, patient continues to have patterns of what if thoughts and catastrophizing.  She reports practicing leaves on a stream and reports this has been helpful in trying to address some of her worries.  However, she continues to experience sleep difficulty due to constant thoughts about various concerns. Suicidal/Homicidal: Nowithout intent/plan  Therapist Response: Reviewed symptoms,  praised and reinforced patient's efforts to use leaves on a stream exercise, discussed effects,  discussed stressors, facilitated expression of thoughts and feelings, validated feelings, assisted patient examine her thought patterns, discussed rationale for and provided instructions on designating a daily worry period, developed plan with patient to implement worry, assisted patient identify ways to cope with worry thoughts beyond the designated worry period, instructed patient to practice leaves on a stream upon completing worry period. Plan: Return again in 2 weeks.  Diagnosis: Generalized anxiety disorder  Collaboration of Care: Other none needed at this session  Patient/Guardian was advised Release of Information must be obtained prior to any record release in order to collaborate their care with an outside provider. Patient/Guardian was advised if they have not already done so to contact the registration department to sign all necessary forms in order for [place] to release information regarding their care.   Consent: Patient/Guardian gives verbal consent for treatment and assignment of benefits for services provided during this visit. Patient/Guardian expressed understanding and agreed to proceed.   Adah Salvage, LCSW 02/04/2023

## 2023-02-18 ENCOUNTER — Ambulatory Visit (INDEPENDENT_AMBULATORY_CARE_PROVIDER_SITE_OTHER): Payer: Medicare Other | Admitting: Psychiatry

## 2023-02-18 DIAGNOSIS — F411 Generalized anxiety disorder: Secondary | ICD-10-CM | POA: Diagnosis not present

## 2023-02-18 NOTE — Progress Notes (Signed)
IN- PERSON  THERAPIST PROGRESS NOTE  Session Time: Monday 02/18/2023  10:15 AM - 11:05 AM   Participation Level: Active  Behavioral Response: CasualAlertAnxious  Type of Therapy: Individual Therapy  Treatment Goals addressed: Patient will score less than 5 on the Generalized Anxiety Disorder 7 Scale (GAD-7Report a decrease in anxiety symptoms as evidenced by an overall reduction in anxiety score by a minimum of 25% on the Generalized Anxiety Disorder Scale   ProgressTowards Goals: Progressing  Interventions: CBT and Supportive  Summary: Angela Norman is a 67 y.o. female who is referred for services by her holistic doctor. Pt reports no psychiatric hospitalizations. She reports no previous involvement in therapy.  Patient states wanting to talk with someone to learn how to cope with anxiety.  She reports anger and resentment regarding her husband to whom she has been marr   ied for 41 years.  She reports dwelling on the past.  She also reports adjustment issues related to retiring on June 1.  Current symptoms include irritability, ruminating about the past, restlessness, muscle tension, worrying about family and the future especially daughter's health.  Patient last was seen for appointment about 2 weeks ago.   She reports being more is the past couple of weeks but is having some difficulty identifying triggers.  She continues to worry about a variety of issues but states worrying about fewer things.  She reports using designated worry period but experienced difficulty managing worry thoughts beyond the worry period.  She states being able to catch some thoughts but dwelling on other worry thoughts.  Patient also reports feeling a little more down lately but denies being depressed.  She reports she is planning to schedule time to meet friends and attend this another social event.  Suicidal/Homicidal: Nowithout intent/plan  Therapist Response: Reviewed symptoms, minister GAD-7, discussed  results, discussed stressors, facilitated expression of thoughts and feelings, validated feelings, praised and reinforced patient's efforts to use designated worry time, assisted patient identify and address difficulties encountered when having thoughts beyond the designated worry time, reiterated focusing attention on the moment and using leaves on a stream exercise, checked out interactive audio activity (leaves on a stream) encouraged patient to follow through with plan on increasing socialization,Plan: Return again in 2 weeks.  Diagnosis: Generalized anxiety disorder  Collaboration of Care: Other none needed at this session  Patient/Guardian was advised Release of Information must be obtained prior to any record release in order to collaborate their care with an outside provider. Patient/Guardian was advised if they have not already done so to contact the registration department to sign all necessary forms in order for [place] to release information regarding their care.   Consent: Patient/Guardian gives verbal consent for treatment and assignment of benefits for services provided during this visit. Patient/Guardian expressed understanding and agreed to proceed.   Adah Salvage, LCSW 02/18/2023

## 2023-02-26 ENCOUNTER — Ambulatory Visit (INDEPENDENT_AMBULATORY_CARE_PROVIDER_SITE_OTHER): Payer: Medicare Other | Admitting: Internal Medicine

## 2023-02-26 ENCOUNTER — Encounter: Payer: Self-pay | Admitting: Internal Medicine

## 2023-02-26 VITALS — BP 119/74 | HR 73 | Temp 98.1°F | Ht 67.0 in | Wt 235.0 lb

## 2023-02-26 DIAGNOSIS — K580 Irritable bowel syndrome with diarrhea: Secondary | ICD-10-CM

## 2023-02-26 NOTE — Patient Instructions (Addendum)
It was good to see you again today  As discussed you have irritable bowel syndrome.  It sounds like it is in good control with the vast majority taking Benefiber 2 tablespoons daily.  Situations where you are at risk for having urgent stools/diarrhea, may take Imodium 2 mg 4 times a day to prevent episodes of diarrhea.  To lessen an attack patient is already started, you can take 1-2 sublingual Levsin's under your tongue right away when the attack begins.   history colonic adenoma; due for surveillance colonoscopy 2030.  Office visit here in 6 months

## 2023-02-26 NOTE — Progress Notes (Signed)
Primary Care Physician:  Benita Stabile, MD Primary Gastroenterologist:  Dr. Jena Gauss  Pre-Procedure History & Physical: HPI:  Angela Norman is a 67 y.o. 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  She brought her stool diary which we reviewed in detail.  About 3-4 times out of the month she may have sudden diarrhea.  Takes Benefiber every day.  Feels this stabilizes her bowel function most of the time.  Taking Levsin sporadically as well.  Celiac screen came back negative.  The remainder the time she has normal bowel function.  No rectal bleeding.  Takes Imodium sporadically. Past Medical History:  Diagnosis Date   Anxiety    Arthritis    Basal cell carcinoma    Charcot's joint of foot    bilateral   Diabetes mellitus without complication (HCC)    Fibromyalgia    Neuropathy     Past Surgical History:  Procedure Laterality Date   ACHILLES TENDON REPAIR Left    bone spur Left    --left foot   BREAST SURGERY     benign breast bx--left--fatty tissue   CARPAL TUNNEL RELEASE Bilateral    COLONOSCOPY     COLONOSCOPY WITH PROPOFOL N/A 09/06/2022   Procedure: COLONOSCOPY WITH PROPOFOL;  Surgeon: Corbin Ade, MD;  Location: [facility];  Service: Endoscopy;  Laterality: N/A;  10:30am, asa 2   DILATION AND CURETTAGE OF UTERUS  2011   for cervical polyp   FOOT SURGERY Left 03/2018   reconstructive surgery   POLYPECTOMY  09/06/2022   Procedure: POLYPECTOMY;  Surgeon: Corbin Ade, MD;  Location: [facility];  Service: Endoscopy;;    Prior to Admission medications   Medication Sig Start Date End Date Taking? Authorizing Provider  cyclobenzaprine (FLEXERIL) 5 MG tablet Take 5 mg by mouth every 8 (eight) hours as needed. 01/17/23  Yes [provider]  desonide (DESOWEN) 0.05 % cream Apply 1 Application topically 2 (two) times daily as needed (on eye lids). 03/20/22  Yes [provider]  gabapentin (NEURONTIN) 600 MG tablet  Take 300-600 mg by mouth See admin instructions. Take 300 mg in the afternoon and 600 mg at bedtime   Yes [provider]  hyoscyamine (LEVSIN SL) 0.125 MG SL tablet Place 1 tablet (0.125 mg total) under the tongue every 4 (four) hours as needed. 11/09/22  Yes Ravynn Hogate, Gerrit Friends, MD  metolazone (ZAROXOLYN) 5 MG tablet Take 5 mg by mouth once a week.   Yes [provider]  NON FORMULARY Take 1 tablet by mouth daily. Carbon 98 BX--a fish oil.  Pt. Takes 5x/week   Yes [provider]  [facility] VERIO test strip daily. 09/17/22  Yes [provider]  OVER THE COUNTER MEDICATION Take 5 tablets by mouth daily. Chromic Chloride   Yes [provider]  Polyethyl Glycol-Propyl Glycol (SYSTANE) 0.4-0.3 % SOLN Place 1 drop into both eyes daily as needed (dry eyes). PF orginal   Yes [provider]  Semaglutide,0.25 or 0.5MG /DOS, (OZEMPIC, 0.25 OR 0.5 MG/DOSE,) 2 MG/3ML SOPN Inject 1 mg into the skin once a week. 06/28/22  Yes [provider]    Allergies as of 02/26/2023   (No Known Allergies)    Family History  Problem Relation Age of Onset   Diabetes Mother    Hypertension Mother    Stroke Mother    Other Mother        myeloproliferative disorder   Alzheimer's disease Father  Hypertension Father    Diabetes Maternal Grandfather    Hypertension Maternal Grandmother    Stroke Maternal Grandmother     Social History   Socioeconomic History   Marital status: Married    Spouse name: Not on file   Number of children: Not on file   Years of education: Not on file   Highest education level: Not on file  Occupational History   Not on file  Tobacco Use   Smoking status: Never   Smokeless tobacco: Never  Vaping Use   Vaping Use: Never used  Substance and Sexual Activity   Alcohol use: Yes    Comment: rarely   Drug use: No   Sexual activity: Not Currently    Partners: Male    Birth control/protection: Post-menopausal    Comment: <5  sexual partners, >42 y/o, no abnormal pap, no STD  Other Topics Concern   Not on file  Social History Narrative   Not on file   Social Determinants of Health   Financial Resource Strain: Not on file  Food Insecurity: Not on file  Transportation Needs: Not on file  Physical Activity: Not on file  Stress: Not on file  Social Connections: Not on file  Intimate Partner Violence: Not on file    Review of Systems: See HPI, otherwise negative ROS  Physical Exam: BP 119/74 (BP Location: Left Arm, Patient Position: Sitting, Cuff Size: Large)   Pulse 73   Temp 98.1 F (36.7 C) (Oral)   Ht 5\' 7"  (1.702 m)   Wt 235 lb (106.6 kg)   LMP 10/22/2008   SpO2 98%   BMI 36.81 kg/m  General:   Alert,  Well-developed, well-nourished, pleasant and cooperative in NAD Neck:  Supple; no masses or thyromegaly. No significant cervical adenopathy. Lungs:  Clear throughout to auscultation.   No wheezes, crackles, or rhonchi. No acute distress. Heart:  Regular rate and rhythm; no murmurs, clicks, rubs,  or gallops. Abdomen: Non-distended, normal bowel sounds.  Soft and nontender without appreciable mass or hepatosplenomegaly.   Impression/Plan: 67 year old lady with intermittent abdominal cramps and nonbloody diarrhea punctuated by long periods of normal bowel function entirely consistent with irritable bowel syndrome D.  Celiac screen negative.  Regular (daily) fiber supplementation is felt to be helpful.   Symptoms are infrequent to the point that daily pharmacotherapy would be somewhat overkill at this time.   I explained to her there is no cure for IBS.  We, however, would like her good days to far outnumber her bad days   Recommendations:  As discussed patient has irritable bowel syndrome.  It sounds like it is in good control with the vast majority taking Benefiber 2 tablespoons daily.  Situations where patient is at risk for having urgent stools/diarrhea, may take Imodium 2 mg 4 times a day to  prevent episodes of diarrhea.  To lessen an attack patient, she should try Levsin sublingual 1-2 at the onset of an attack.  Hx colonic adenoma; due for surveillance colonoscopy 2030.  Office visit here in 6 months   Notice: This dictation was prepared with Dragon dictation along with smaller phrase technology. Any transcriptional errors that result from this process are unintentional and may not be corrected upon review.

## 2023-03-07 ENCOUNTER — Ambulatory Visit (INDEPENDENT_AMBULATORY_CARE_PROVIDER_SITE_OTHER): Payer: Medicare Other | Admitting: Psychiatry

## 2023-03-07 DIAGNOSIS — F411 Generalized anxiety disorder: Secondary | ICD-10-CM

## 2023-03-07 NOTE — Progress Notes (Signed)
IN- PERSON  THERAPIST PROGRESS NOTE  Session Time: Thursday 03/07/2023  1:10 PM - 2:00 PM   Participation Level: Active  Behavioral Response: CasualAlertAnxious  Type of Therapy: Individual Therapy  Treatment Goals addressed: Patient will score less than 5 on the Generalized Anxiety Disorder 7 Scale (GAD-7Report a decrease in anxiety symptoms as evidenced by an overall reduction in anxiety score by a minimum of 25% on the Generalized Anxiety Disorder Scale   ProgressTowards Goals: Progressing  Interventions: CBT and Supportive  Summary: Angela Norman is a 67 y.o. female who is referred for services by her holistic doctor. Pt reports no psychiatric hospitalizations. She reports no previous involvement in therapy.  Patient states wanting to talk with someone to learn how to cope with anxiety.  She reports anger and resentment regarding her husband to whom she has been marr   ied for 41 years.  She reports dwelling on the past.  She also reports adjustment issues related to retiring on June 1.  Current symptoms include irritability, ruminating about the past, restlessness, muscle tension, worrying about family and the future especially daughter's health.  Patient last was seen for appointment about 2 weeks ago.  She reports continued decreased nervousness and restlessness.  However, she reports continued worry but is able to identify her triggers.  She has been using a worry time and leaves on a stream exercise to try to cope with ruminating thoughts.  She expresses frustration as she is not able to enjoy life and be happy at times due to worry.  She continues to have a pattern of negative thoughts.  She has been maintaining involvement in activity and has increased socialization.  She reports this has been helpful.   Suicidal/Homicidal: Nowithout intent/plan  Therapist Response: Reviewed symptoms, administered GAD-7, discussed results, reviewed treatment plan, obtained patient's  permission to electronically sign plan, provided patient with copy of plan, discussed stressors, facilitated expression of thoughts and feelings, assisted patient examine her worry thoughts and identify strategies to cope with what if thoughts, assisted patient r identify/challenge/and replace catastrophizing thoughts with more rational thoughts, also discussed using leaves on a stream to cope with ruminating thoughts, discussed next steps for treatment, provided patient with handout understanding worry, developed plan with patient to review handout in preparation for next session   ,Plan: Return again in 2 weeks.  Diagnosis: Generalized anxiety disorder  Collaboration of Care: Other none needed at this session  Patient/Guardian was advised Release of Information must be obtained prior to any record release in order to collaborate their care with an outside provider. Patient/Guardian was advised if they have not already done so to contact the registration department to sign all necessary forms in order for [place] to release information regarding their care.   Consent: Patient/Guardian gives verbal consent for treatment and assignment of benefits for services provided during this visit. Patient/Guardian expressed understanding and agreed to proceed.   Adah Salvage, LCSW 03/07/2023

## 2023-03-21 ENCOUNTER — Ambulatory Visit (INDEPENDENT_AMBULATORY_CARE_PROVIDER_SITE_OTHER): Payer: Medicare Other | Admitting: Psychiatry

## 2023-03-21 DIAGNOSIS — F411 Generalized anxiety disorder: Secondary | ICD-10-CM

## 2023-03-21 NOTE — Progress Notes (Signed)
IN- PERSON  THERAPIST PROGRESS NOTE  Session Time: Thursday 03/21/2023  1:10 PM - 2:00 PM   Participation Level: Active  Behavioral Response: CasualAlertAnxious  Type of Therapy: Individual Therapy  Treatment Goals addressed: Patient will score less than 5 on the Generalized Anxiety Disorder 7 Scale (GAD-7Report a decrease in anxiety symptoms as evidenced by an overall reduction in anxiety score by a minimum of 25% on the Generalized Anxiety Disorder Scale   ProgressTowards Goals: Progressing  Interventions: CBT and Supportive  Summary: SESHA SZEWCZYK is a 67 y.o. female who is referred for services by her holistic doctor. Pt reports no psychiatric hospitalizations. She reports no previous involvement in therapy.  Patient states wanting to talk with someone to learn how to cope with anxiety.  She reports anger and resentment regarding her husband to whom she has been marr       ied for 41 years.  She reports dwelling on the past.  She also reports adjustment issues related to retiring on June 1.  Current symptoms include irritability, ruminating about the past, restlessness, muscle tension, worrying about family and the future especially daughter's health.  Patient last was seen for appointment about 2 weeks ago.  She reports continued mild symptoms of anxiety since last session.  She reports nervousness triggered by neighbor recently dying.  She has been practicing progressive muscle relaxation and deep breathing.  She reports still worrying about her daughters upcoming baby shower but has been trying to use replacement statements for worry thoughts.  Patient reports observing she recently woke up feeling the best she felt in a long time and identifies a good night sleep as a possible reason this happened.  Patient still reports being lonely at times and missing the community she had at work.  She has had difficulty trying to connect with other groups consistently besides her immediate  family.  Suicidal/Homicidal: Nowithout intent/plan  Therapist Response: Reviewed symptoms, administered GAD-7, discussed results, praised and reinforced patient's efforts to replace worry thoughts, discussed effects, reviewed use of worry period and leaves on a stream exercise to cope with ruminating thoughts, discussed the role of sleep regarding mood/anxiety/stress, assisted patient identify ways to improve sleep patterns, also assisted patient identify possible groups in the community to research regarding possible participation, also discussed possibility patient joining exercise program at [facility]   ,Plan: Return again in 2 weeks.  Diagnosis: Generalized anxiety disorder  Collaboration of Care: Other none needed at this session  Patient/Guardian was advised Release of Information must be obtained prior to any record release in order to collaborate their care with an outside provider. Patient/Guardian was advised if they have not already done so to contact the registration department to sign all necessary forms in order for [place] to release information regarding their care.   Consent: Patient/Guardian gives verbal consent for treatment and assignment of benefits for services provided during this visit. Patient/Guardian expressed understanding and agreed to proceed.   Adah Salvage, LCSW 03/21/2023

## 2023-04-01 ENCOUNTER — Ambulatory Visit (INDEPENDENT_AMBULATORY_CARE_PROVIDER_SITE_OTHER): Payer: Medicare Other | Admitting: Psychiatry

## 2023-04-01 DIAGNOSIS — F411 Generalized anxiety disorder: Secondary | ICD-10-CM

## 2023-04-01 NOTE — Progress Notes (Signed)
IN- PERSON  THERAPIST PROGRESS NOTE  Session Time: Monday  04/01/2023 3:17 PM -  Participation Level: Active  Behavioral Response: CasualAlertAnxious  Type of Therapy: Individual Therapy  Treatment Goals addressed: Patient will score less than 5 on the Generalized Anxiety Disorder 7 Scale (GAD-7Report a decrease in anxiety symptoms as evidenced by an overall reduction in anxiety score by a minimum of 25% on the Generalized Anxiety Disorder Scale   ProgressTowards Goals: Progressing  Interventions: CBT and Supportive  Summary: Angela Norman is a 67 y.o. female who is referred for services by her holistic doctor. Pt reports no psychiatric hospitalizations. She reports no previous involvement in therapy.  Patient states wanting to talk with someone to learn how to cope with anxiety.  She reports anger and resentment regarding her husband to whom she has been marr       ied for 41 years.  She reports dwelling on the past.  She also reports adjustment issues related to retiring on June 1.  Current symptoms include irritability, ruminating about the past, restlessness, muscle tension, worrying about family and the future especially daughter's health.  Patient last was seen for appointment about 2 weeks ago.  She reports continued mild symptoms of anxiety since last session.  She reports nervousness triggered by neighbor recently dying.  She has been practicing progressive muscle relaxation and deep breathing.  She reports still worrying about her daughters upcoming baby shower but has been trying to use replacement statements for worry thoughts.  Patient reports observing she recently woke up feeling the best she felt in a long time and identifies a good night sleep as a possible reason this happened.  Patient still reports being lonely at times and missing the community she had at work.  She has had difficulty trying to connect with other groups consistently besides her immediate family.   Suicidal/Homicidal: Nowithout intent/plan  Therapist Response: Reviewed symptoms, administered GAD-7, discussed results, praised and reinforced patient's efforts to replace worry thoughts, discussed effects, reviewed use of worry period and leaves on a stream exercise to cope with ruminating thoughts, discussed the role of sleep regarding mood/anxiety/stress, assisted patient identify ways to improve sleep patterns, also assisted patient identify possible groups in the community to research regarding possible participation, also discussed possibility patient joining exercise program at the [facility]   ,Plan: Return again in 2 weeks.  Diagnosis: Generalized anxiety disorder  Collaboration of Care: Other none needed at this session  Patient/Guardian was advised Release of Information must be obtained prior to any record release in order to collaborate their care with an outside provider. Patient/Guardian was advised if they have not already done so to contact the registration department to sign all necessary forms in order for [place] to release information regarding their care.   Consent: Patient/Guardian gives verbal consent for treatment and assignment of benefits for services provided during this visit. Patient/Guardian expressed understanding and agreed to proceed.   Adah Salvage, LCSW 04/01/2023

## 2023-04-04 ENCOUNTER — Ambulatory Visit (HOSPITAL_COMMUNITY): Payer: Medicare Other | Admitting: Psychiatry

## 2023-04-24 ENCOUNTER — Ambulatory Visit (INDEPENDENT_AMBULATORY_CARE_PROVIDER_SITE_OTHER): Payer: Medicare Other | Admitting: Psychiatry

## 2023-04-24 DIAGNOSIS — F411 Generalized anxiety disorder: Secondary | ICD-10-CM
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
## 2023-05-09 ENCOUNTER — Other Ambulatory Visit (INDEPENDENT_AMBULATORY_CARE_PROVIDER_SITE_OTHER): Payer: Medicare Other

## 2023-05-09 ENCOUNTER — Ambulatory Visit (INDEPENDENT_AMBULATORY_CARE_PROVIDER_SITE_OTHER): Payer: Medicare Other | Admitting: Orthopedic Surgery

## 2023-05-09 ENCOUNTER — Encounter: Payer: Self-pay | Admitting: Orthopedic Surgery

## 2023-05-09 VITALS — BP 163/70 | HR 96 | Ht 67.0 in | Wt 235.0 lb

## 2023-05-09 DIAGNOSIS — M25512 Pain in left shoulder: Secondary | ICD-10-CM | POA: Diagnosis not present

## 2023-05-09 DIAGNOSIS — G8929 Other chronic pain: Secondary | ICD-10-CM

## 2023-05-09 DIAGNOSIS — M778 Other enthesopathies, not elsewhere classified: Secondary | ICD-10-CM

## 2023-05-09 NOTE — Patient Instructions (Signed)
ALEVE TWICE A DAY AS NEEDED   ADD LINIMENT AS NEEDED

## 2023-05-09 NOTE — Progress Notes (Signed)
Patient: Angela Norman           Date of Birth: 1956-07-22           MRN: [id number] Visit Date: 05/09/2023 Requested by: Benita Stabile, MD [address] [place],  [place] [address] PCP: Benita Stabile, MD    Chief Complaint  Patient presents with   Shoulder Pain    Left for about a year with ROM     Santina Evans last saw [place] November 22 for problem with her wrist  She comes in today with left arm and shoulder pain for "some while"  She has certain positions where the arm will bother her and lifting seems to aggravate it.  She tried some Aleve and topical ointments (horse liniment) and a heating pad and this seems to help  The pain is located really on the lateral arm and runs from the acromion to about the deltoid insertion.  She denies any trauma and has never had surgery on the shoulder    Body mass index is 36.81 kg/m.   Problem list, medical hx, medications and allergies reviewed   Review of Systems  Constitutional:  Negative for fever.  Respiratory:  Negative for shortness of breath.   Cardiovascular:  Negative for chest pain.  Skin: Negative.   Neurological:  Negative for tingling and sensory change.     No Known Allergies  BP (!) 163/70   Pulse 96   Ht 5\' 7"  (1.702 m)   Wt 235 lb (106.6 kg)   LMP 10/22/2008   BMI 36.81 kg/m    Physical exam: Physical Exam Vitals and nursing note reviewed.  Constitutional:      Appearance: Normal appearance.  HENT:     Head: Normocephalic and atraumatic.  Eyes:     General: No scleral icterus.       Right eye: No discharge.        Left eye: No discharge.     Extraocular Movements: Extraocular movements intact.     Conjunctiva/sclera: Conjunctivae normal.     Pupils: Pupils are equal, round, and reactive to light.  Cardiovascular:     Rate and Rhythm: Normal rate.     Pulses: Normal pulses.  Musculoskeletal:     Left shoulder: Tenderness present. No swelling, deformity, effusion, laceration, bony tenderness  or crepitus. Normal range of motion. Normal strength. Normal pulse.       Arms:     Comments: Essentially had negative signs of adhesive capsulitis, had some pain with the speeds test for biceps tendon pathology but the rotator interval and biceps tendon were nontender.  Cuff strength was otherwise normal  Skin:    General: Skin is warm and dry.     Capillary Refill: Capillary refill takes less than 2 seconds.  Neurological:     General: No focal deficit present.     Mental Status: She is alert and oriented to person, place, and time.  Psychiatric:        Mood and Affect: Mood normal.        Behavior: Behavior normal.        Thought Content: Thought content normal.        Judgment: Judgment normal.     Data reviewed:   X-ray left shoulder no abnormality seen on the plain shoulder film  Assessment and plan:  Encounter Diagnoses  Name Primary?   Chronic left shoulder pain Yes   Tendinitis of left shoulder     67 year old female with  left arm pain mainly in the deltoid may have some components of biceps tendinitis with it as deep Speed's Test did cause pain and some weakness on forward elevation with the palm in the supinated position  Recommend Aleve twice a day and liniment as needed   No orders of the defined types were placed in this encounter.   Procedures:   No injections performed

## 2023-05-15 ENCOUNTER — Ambulatory Visit (INDEPENDENT_AMBULATORY_CARE_PROVIDER_SITE_OTHER): Payer: Medicare Other | Admitting: Psychiatry

## 2023-05-15 DIAGNOSIS — F411 Generalized anxiety disorder: Secondary | ICD-10-CM | POA: Diagnosis not present

## 2023-05-15 NOTE — Progress Notes (Signed)
IN- PERSON  THERAPIST PROGRESS NOTE  Session Time: Wednesday 05/15/2023 11:18 AM - 12:05 PM   Participation Level: Active  Behavioral Response: CasualAlertAnxious  Type of Therapy: Individual Therapy  Treatment Goals addressed: Patient will score less than 5 on the Generalized Anxiety Disorder 7 Scale (GAD-7Report a decrease in anxiety symptoms as evidenced by an overall reduction in anxiety score by a minimum of 25% on the Generalized Anxiety Disorder Scale   ProgressTowards Goals: Progressing  Interventions: CBT and Supportive  Summary: Angela Norman is a 67 y.o. female who is referred for services by her holistic doctor. Pt reports no psychiatric hospitalizations. She reports no previous involvement in therapy.  Patient states wanting to talk with someone to learn how to cope with anxiety.  She reports anger and resentment regarding her husband to whom she has been marr  ied for 41 years.  She reports dwelling on the past.  She also reports adjustment issues related to retiring on June 1.  Current symptoms include irritability, ruminating about the past, restlessness, muscle tension, worrying about family and the future especially daughter's health.  Patient last was seen for appointment about 2 weeks ago.  She reports continued mild symptoms of anxiety since last session as reflected in the GAD-7.  She has maintained involvement in activity and reports increased socialization.  She enjoyed a recent beach trip with her daughter and son-in-law.  She experienced some anxiety regarding possible issues with IBS but reports managing this well with self talk and relaxation techniques.  She has been going out to lunch with her friends.  She also is planning future family events.  She expresses some anxiety about her daughter having twins at the end of August 1 September but is more excited.  She reports implementing plan to try to identify positive aspects about her husband and reports  experiencing less resentment along with increased positive thoughts.    Suicidal/Homicidal: Nowithout intent/plan  Therapist Response: Reviewed symptoms, administered GAD-7, discussed results, praised and reinforced patient's continued involvement in activity and increased socialization, discussed effects, praised and reinforced patient's use of helpful coping strategies to manage anxiety, discussed rationale for and introduced mindfulness and the window of tolerance to identify ways to help regulate emotions and avoid relapse of symptoms, provided psychoeducation, discussed rationale for and assisted patient practice grounding techniques to use when outside of the window of tolerance, developed plan with patient to practice a grounding technique daily, praised and reinforced patient's implementation of plan regarding relationship with husband and pursuing activities consistent with her values   Plan: Return again in 2 weeks.  Diagnosis: Generalized anxiety disorder  Collaboration of Care: Other none needed at this session  Patient/Guardian was advised Release of Information must be obtained prior to any record release in order to collaborate their care with an outside provider. Patient/Guardian was advised if they have not already done so to contact the registration department to sign all necessary forms in order for [place] to release information regarding their care.   Consent: Patient/Guardian gives verbal consent for treatment and assignment of benefits for services provided during this visit. Patient/Guardian expressed understanding and agreed to proceed.   Adah Salvage, LCSW 05/15/2023

## 2023-06-04 ENCOUNTER — Encounter: Payer: Self-pay | Admitting: Internal Medicine

## 2023-06-04 ENCOUNTER — Ambulatory Visit: Payer: Medicare Other | Admitting: Internal Medicine

## 2023-06-04 VITALS — BP 133/78 | HR 73 | Temp 97.7°F | Ht 67.0 in | Wt 236.2 lb

## 2023-06-04 DIAGNOSIS — R194 Change in bowel habit: Secondary | ICD-10-CM | POA: Diagnosis not present

## 2023-06-04 NOTE — Patient Instructions (Signed)
It was good to see you again today!  From a GI perspective, I am glad you are off Ozempic.  Stop Benefiber and any other fiber supplement you may be taking  I recommend you take 1 Imodium tablet at bedtime every night whether you feel you needed or not.  I also recommend you take the Levsin sublingual 1 tablet under your tongue 20 minutes before breakfast, lunch and supper.  If you go a day without a bowel movement that is okay;  if you go more than 1 day without a bowel movement back off on taking the Levsin .  Call us in 2 weeks and ask for Tammy.  Provide a progress report of how you did over the past 2 weeks.  Office visit here in November or December of this year  Plan for colonoscopy 2030.

## 2023-06-04 NOTE — Progress Notes (Signed)
Primary Care Physician:  Benita Stabile, MD Primary Gastroenterologist:  Dr.   Pre-Procedure History & Physical: HPI:  Angela Norman is a 67 y.o. female here for follow-up of altered bowel function.  History of IBS.  Developed constipation while taking Ozempic earlier this year.  She has now been off of Ozempic for 6 weeks.  No longer constipated.  Describes having multiple soft formed stools daily sometimes passes stool with urine.  Worse with stress.  Her daughter had twins this morning.  She is going to live with them for 6 weeks.  This is added to her stress levels.  Is not passing any blood.  She stopped taking Benefiber recently.  Takes Levsin sporadically as well as Imodium.  No nausea or vomiting.  Denies watery diarrhea.   At her last office visit-celiac screen negative.  History of small adenoma removed previously; due for follow-up colonoscopy 2030.   Past Surgical History:  Procedure Laterality Date   ACHILLES TENDON REPAIR Left    bone spur Left    --left foot   BREAST SURGERY     benign breast bx--left--fatty tissue   CARPAL TUNNEL RELEASE Bilateral    COLONOSCOPY     COLONOSCOPY WITH PROPOFOL N/A 09/06/2022   Procedure: COLONOSCOPY WITH PROPOFOL;  Surgeon: Corbin Ade, MD;  Location: [facility];  Service: Endoscopy;  Laterality: N/A;  10:30am, asa 2   DILATION AND CURETTAGE OF UTERUS  2011   for cervical polyp   FOOT SURGERY Left 03/2018   reconstructive surgery   POLYPECTOMY  09/06/2022   Procedure: POLYPECTOMY;  Surgeon: Corbin Ade, MD;  Location: [facility];  Service: Endoscopy;;    Prior to Admission medications   Medication Sig Start Date End Date Taking? Authorizing Provider  cyclobenzaprine (FLEXERIL) 5 MG tablet Take 5 mg by mouth every 8 (eight) hours as needed. 01/17/23  Yes [provider]  desonide (DESOWEN) 0.05 % cream Apply 1 Application topically 2 (two) times daily as needed (on eye lids). 03/20/22  Yes [provider]  doxycycline (VIBRAMYCIN) 100 MG capsule Take 100 mg by mouth 2 (two) times daily. 05/30/23  Yes [provider]  gabapentin (NEURONTIN) 600 MG tablet Take 300-600 mg by mouth See admin instructions. Take 300 mg in the afternoon and 600 mg at bedtime   Yes [provider]  hyoscyamine (LEVSIN SL) 0.125 MG SL tablet Place 1 tablet (0.125 mg total) under the tongue every 4 (four) hours as needed. 11/09/22  Yes Friedrich Harriott, Gerrit Friends, MD  metFORMIN (GLUCOPHAGE) 500 MG tablet Take 500 mg by mouth 2 (two) times daily with a meal.   Yes [provider]  metolazone (ZAROXOLYN) 5 MG tablet Take 5 mg by mouth once a week.   Yes [provider]  NON FORMULARY Take 1 tablet by mouth daily. Carbon 98 BX--a fish oil.  Pt. Takes 5x/week   Yes [provider]  [facility] VERIO test strip daily. 09/17/22  Yes [provider]  OVER THE COUNTER MEDICATION Take 5 tablets by mouth daily. Chromic Chloride   Yes [provider]  Polyethyl Glycol-Propyl Glycol (SYSTANE) 0.4-0.3 % SOLN Place 1 drop into both eyes daily as needed (dry eyes). PF orginal   Yes [provider]    Allergies as of 06/04/2023   (No Known Allergies)    Family History  Problem Relation Age of Onset   Diabetes Mother    Hypertension Mother    Stroke  Mother    Other Mother        myeloproliferative disorder   Alzheimer's disease Father    Hypertension Father    Diabetes Maternal Grandfather    Hypertension Maternal Grandmother    Stroke Maternal Grandmother     Social History   Socioeconomic History   Marital status: Married    Spouse name: Not on file   Number of children: Not on file   Years of education: Not on file   Highest education level: Not on file  Occupational History   Not on file  Tobacco Use   Smoking status: Never   Smokeless tobacco: Never  Vaping Use   Vaping status: Never Used  Substance and Sexual Activity   Alcohol use: Yes     Comment: rarely   Drug use: No   Sexual activity: Not Currently    Partners: Male    Birth control/protection: Post-menopausal    Comment: <5 sexual partners, >67 y/o, no abnormal pap, no STD  Other Topics Concern   Not on file  Social History Narrative   Not on file   Social Determinants of Health   Financial Resource Strain: Not on file  Food Insecurity: Not on file  Transportation Needs: Not on file  Physical Activity: Not on file  Stress: Not on file  Social Connections: Not on file  Intimate Partner Violence: Not on file    Review of Systems: See HPI, otherwise negative ROS  Physical Exam: BP 133/78 (BP Location: Right Arm, Patient Position: Sitting, Cuff Size: Large)   Pulse 73   Temp 97.7 F (36.5 C) (Oral)   Ht 5\' 7"  (1.702 m)   Wt 236 lb 3.2 oz (107.1 kg)   LMP 10/22/2008   SpO2 97%   BMI 36.99 kg/m  General:   Alert,  Well-developed, well-nourished, pleasant and cooperative in NAD Neck:  Supple; no masses or thyromegaly. No significant cervical adenopathy. Lungs:  Clear throughout to auscultation.   No wheezes, crackles, or rhonchi. No acute distress. Heart:  Regular rate and rhythm; no murmurs, clicks, rubs,  or gallops. Abdomen: Non-distended, normal bowel sounds.  Soft and nontender without appreciable mass or hepatosplenomegaly.    Impression/Plan: 67 year old lady with altered bowel function most consistent with irritable bowel syndrome.  She is nearly out from under the effects of Ozempic which was producing constipation.  She is nearing back to baseline.  She is really describing hyperdefecation and no out diarrhea or incontinence.  Symptoms most consistent with irritable bowel syndrome.  Her GI tract may not have quite reached baseline steady state from the effects of Ozempic as of yet.  No alarm symptoms at this time.  History of small colonic adenomas; due for surveillance colonoscopy 2030.  Recommendations:  Stop Benefiber and any other fiber  supplement.  I recommend 1 Imodium tablet at bedtime every night as a scheduled medication.  I also recommend  Levsin sublingual 1 tablet under the tongue 20 minutes before breakfast, lunch and supper.  If more than 1 day without a bowel movement, back off on taking the Levsin .  Call  in 2 weeks progress report.  Office visit here in November or December of this year  Plan for colonoscopy 2030.     Notice: This dictation was prepared with Dragon dictation along with smaller phrase technology. Any transcriptional errors that result from this process are unintentional and may not be corrected upon review.

## 2023-06-05 ENCOUNTER — Encounter (HOSPITAL_COMMUNITY): Payer: Self-pay

## 2023-06-05 ENCOUNTER — Ambulatory Visit (HOSPITAL_COMMUNITY): Payer: Medicare Other | Admitting: Psychiatry

## 2023-06-18 ENCOUNTER — Telehealth: Payer: Self-pay

## 2023-06-18 NOTE — Telephone Encounter (Signed)
Pt was made aware and verbalized understanding.  

## 2023-06-18 NOTE — Telephone Encounter (Signed)
Pt called with an update. Pt states that she has not taken any imodium or levsin since 8/18. Pt states that since this past Thursday the 22nd she has had a bm every day since but they have only been hard balls. Please advise.

## 2023-06-20 ENCOUNTER — Ambulatory Visit (HOSPITAL_COMMUNITY): Payer: Medicare Other | Admitting: Psychiatry

## 2023-08-19 ENCOUNTER — Ambulatory Visit (INDEPENDENT_AMBULATORY_CARE_PROVIDER_SITE_OTHER): Payer: Medicare Other | Admitting: Psychiatry

## 2023-08-19 DIAGNOSIS — F411 Generalized anxiety disorder: Secondary | ICD-10-CM | POA: Diagnosis not present

## 2023-08-19 NOTE — Progress Notes (Signed)
IN- PERSON  THERAPIST PROGRESS NOTE  Session Time: Monday 05/15/2023 10:07 AM - 11:03 AM                Participation Level: Active  Behavioral Response: CasualAlertAnxious  Type of Therapy: Individual Therapy  Treatment Goals addressed: Patient will score less than 5 on the Generalized Anxiety Disorder 7 Scale (GAD-7Report a decrease in anxiety symptoms as evidenced by an overall reduction in anxiety score by a minimum of 25% on the Generalized Anxiety Disorder Scale   ProgressTowards Goals: Progressing  Interventions: CBT and Supportive  Summary: Angela Norman is a 67 y.o. female who is referred for services by her holistic doctor. Pt reports no psychiatric hospitalizations. She reports no previous involvement in therapy.  Patient states wanting to talk with someone to learn how to cope with anxiety.  She reports anger and resentment regarding her husband to whom she has been marr  ied for 41 years.  She reports dwelling on the past.  She also reports adjustment issues related to retiring on June 1.  Current symptoms include irritability, ruminating about the past, restlessness, muscle tension, worrying about family and the future especially daughter's health.  Patient last was seen for appointment about 3 months ago.  She denies experiencing overwhelming anxiety and nervousness since last session.  She partially attributes this to no longer being on Ozempic.       Per her report, her daughter had twins prematurely in August 2024.  The twins are doing well now.  Per patient's report, she has been staying with her daughter and son-in-law most of the time to help them adjust to becoming parents and provide care for the children.  She reports going back to her home about once a week and spending a few days before returning to her daughter's home.  However, she will stop doing this around the first week of December as her son-in-law will be off work for the holidays and will be available  during the day as well as at night.  The children will start daycare in January.  Patient reports being glad to be there for her daughter and grandchildren but being very tired.  She reports she is trying to use time in the evenings for self-care and relaxation.  She reports still becoming upset with husband at times when she sees the way daughter's husband actively is involved with the care of the children as patient's husband did not do this when their children were born.  However, she reports not dwelling on this.  Patient is pleased with her progress in treatment and expresses confidence in her abilities to successfully use coping strategies.    Suicidal/Homicidal: Nowithout intent/plan  Therapist Response: Reviewed symptoms, facilitated patient expressing thoughts and feelings regarding assisting daughter and son-in-law/providing care to the grandchildren, validated and normalized feelings, praised and reinforced patient's efforts to maintain positive self-care, assisted patient identify realistic expectations of self, facilitated patient expressing thoughts and feelings regarding relationship with husband, validated feelings, assisted patient identify realistic expectations of self and husband, discussed patient's progress in treatment, did termination, encouraged patient to contact this practice should she need psychotherapy services in the future  Diagnosis: Generalized anxiety disorder  Collaboration of Care: Other none needed at this session  Patient/Guardian was advised Release of Information must be obtained prior to any record release in order to collaborate their care with an outside provider. Patient/Guardian was advised if they have not already done so to contact the registration department to  sign all necessary forms in order for [place] to release information regarding their care.   Consent: Patient/Guardian gives verbal consent for treatment and assignment of benefits for services provided  during this visit. Patient/Guardian expressed understanding and agreed to proceed.   Adah Salvage, LCSW 08/19/2023  Outpatient Therapist Discharge Summary  Angela Norman    [birth year]   Admission Date: 05/02/2022 Discharge Date: 08/19/2023 Reason for Discharge: Treatment goals accomplished Diagnosis:  Axis I:  Generalized anxiety disorder   Comments: Patient is encouraged to contact this practice should she need psychotherapy services in the future.  Rachel Rison E Brenda Cowher LCSW

## 2023-08-20 ENCOUNTER — Other Ambulatory Visit (HOSPITAL_COMMUNITY): Payer: Self-pay | Admitting: Family Medicine

## 2023-08-20 DIAGNOSIS — Z13820 Encounter for screening for osteoporosis: Secondary | ICD-10-CM

## 2023-09-10 ENCOUNTER — Encounter: Payer: Self-pay | Admitting: Internal Medicine

## 2023-10-21 ENCOUNTER — Telehealth: Payer: Self-pay | Admitting: Gastroenterology

## 2023-10-21 NOTE — Telephone Encounter (Signed)
Patient left a message that she had an accident before Thanksgiving where she passed out and fell in her daughter's bathroom and broke her neck.  She said that she was currently in a rehab facility and didn't know when she would be back here.

## 2024-04-17 ENCOUNTER — Other Ambulatory Visit (HOSPITAL_COMMUNITY): Payer: Self-pay | Admitting: Nurse Practitioner

## 2024-04-17 DIAGNOSIS — Z8639 Personal history of other endocrine, nutritional and metabolic disease: Secondary | ICD-10-CM

## 2024-04-17 DIAGNOSIS — R911 Solitary pulmonary nodule: Secondary | ICD-10-CM

## 2024-04-27 ENCOUNTER — Encounter (HOSPITAL_COMMUNITY): Payer: Self-pay

## 2024-04-27 ENCOUNTER — Ambulatory Visit (HOSPITAL_COMMUNITY): Attending: Surgery

## 2024-04-27 ENCOUNTER — Other Ambulatory Visit: Payer: Self-pay

## 2024-04-27 DIAGNOSIS — R278 Other lack of coordination: Secondary | ICD-10-CM | POA: Diagnosis present

## 2024-04-27 DIAGNOSIS — R29818 Other symptoms and signs involving the nervous system: Secondary | ICD-10-CM | POA: Insufficient documentation

## 2024-04-27 DIAGNOSIS — G9520 Unspecified cord compression: Secondary | ICD-10-CM | POA: Diagnosis present

## 2024-04-27 DIAGNOSIS — Z7409 Other reduced mobility: Secondary | ICD-10-CM | POA: Insufficient documentation

## 2024-04-27 DIAGNOSIS — M6281 Muscle weakness (generalized): Secondary | ICD-10-CM | POA: Diagnosis present

## 2024-04-27 NOTE — Therapy (Signed)
 OUTPATIENT PHYSICAL THERAPY NEURO EVALUATION   Patient Name: Angela Norman MRN: [id number] DOB:11/05/1955, 68 y.o., female Today's Date: 04/27/2024   PCP: Sebastian Othel GAILS, FNP REFERRING PROVIDER: Edna Norris, MD  END OF SESSION:  PT End of Session - 04/27/24 1347     Visit Number 1 (P)     Date for PT Re-Evaluation 06/22/24 (P)     Authorization Type Medicare A & B (P)     Progress Note Due on Visit 10 (P)     PT Start Time 1305 (P)     PT Stop Time 1345 (P)     PT Time Calculation (min) 40 min (P)     Behavior During Therapy WFL for tasks assessed/performed (P)           Past Medical History:  Diagnosis Date   Anxiety    Arthritis    Basal cell carcinoma    Charcot's joint of foot    bilateral   Diabetes mellitus without complication (HCC)    Fibromyalgia    Neuropathy    Past Surgical History:  Procedure Laterality Date   ACHILLES TENDON REPAIR Left    bone spur Left    --left foot   BREAST SURGERY     benign breast bx--left--fatty tissue   CARPAL TUNNEL RELEASE Bilateral    COLONOSCOPY     COLONOSCOPY WITH PROPOFOL  N/A 09/06/2022   Procedure: COLONOSCOPY WITH PROPOFOL ;  Surgeon: Shaaron Lamar HERO, MD;  [facility]: [facility];  Service: Endoscopy;  Laterality: N/A;  10:30am, asa 2   DILATION AND CURETTAGE OF UTERUS  2011   for cervical polyp   FOOT SURGERY Left 03/2018   reconstructive surgery   POLYPECTOMY  09/06/2022   Procedure: POLYPECTOMY;  Surgeon: Shaaron Lamar HERO, MD;  Location: [facility];  Service: Endoscopy;;   Patient Active Problem List   Diagnosis Date Noted   Anxiety 06/28/2022   Arthritis 06/28/2022   Body fluid retention 06/28/2022   Hyperglycemia due to type 2 diabetes mellitus (HCC) 06/28/2022   Mixed hyperlipidemia 06/28/2022   Fibromyalgia 06/28/2022   Cough 10/25/2021   Musculoskeletal pain 05/21/2012   Stiffness of joint, not elsewhere classified, ankle and foot 08/13/2011   Difficulty walking 08/13/2011    Ankle weakness 08/13/2011    ONSET DATE: November 2024  REFERRING DIAG: compression of spinal cord   THERAPY DIAG:  Compression of spinal cord (HCC)  Impaired functional mobility, balance, gait, and endurance  Muscle weakness (generalized)  Rationale for Evaluation and Treatment: Rehabilitation  SUBJECTIVE:  SUBJECTIVE STATEMENT: Pt reported all this started with UTI that caused fracture f neck and indicated as C4-C7 fusion. Pt had multiple bouts of acute and inpatient rehab stays due to complications. Significant BP issues throughout this time. Had stay in multiple facilities. Pt reported various changes in functional status, was requiring assist for ADLs and mobility since April 28th. Since then, pt has been home with Home Health services. Pt continues to report numbness in BUE and poor grip strength. Pt reports neuropathy in BLE. Prior to onset of November '24 was walking daily for 45-60 minutes.   Pt has CT scan for nodule on Lung on 04/28/24.  Pt accompanied by: self  PERTINENT HISTORY:  Cervical Myelopathy with ACDF 4-7 in November 2024\ Left ankle surgery x 3  PAIN:  Are you having pain? No and chronic pain in left shoulder  PRECAUTIONS: None  RED FLAGS: Bowel or bladder incontinence: Yes: SCI   WEIGHT BEARING RESTRICTIONS: No  FALLS: Has patient fallen in last 6 months? Yes. Number of falls 1-2   PATIENT GOALS:  want to walk with AD well and with confidence and strengthening RUE.  OBJECTIVE:  Note: Objective measures were completed at Evaluation unless otherwise noted.  DIAGNOSTIC FINDINGS:   COGNITION: Overall cognitive status: Within functional limits for tasks assessed   SENSATION: Light touch: decreased light touch from lateral malleolus distally.   LOWER EXTREMITY  ROM:     Active  Right Eval Left Eval  Hip flexion    Hip extension    Hip abduction    Hip adduction    Hip internal rotation    Hip external rotation    Knee flexion    Knee extension    Ankle dorsiflexion    Ankle plantarflexion    Ankle inversion    Ankle eversion     (Blank rows = not tested)  LOWER EXTREMITY MMT:    MMT Right Eval Left Eval  Hip flexion    Hip extension    Hip abduction    Hip adduction    Hip internal rotation    Hip external rotation    Knee flexion    Knee extension 4- 4+  Ankle dorsiflexion    Ankle plantarflexion    Ankle inversion    Ankle eversion    (Blank rows = not tested)   TRANSFERS: Sit to stand: Modified independence  Assistive device utilized: Environmental consultant - 2 wheeled     Stand to sit: Modified independence  Assistive device utilized: Environmental consultant - 2 wheeled     Chair to chair: SBA  Assistive device utilized: Environmental consultant - 2 wheeled       GAIT: Findings: Gait Characteristics: step through pattern, decreased arm swing- Right, decreased arm swing- Left, decreased step length- Right, decreased step length- Left, decreased hip/knee flexion- Right, decreased hip/knee flexion- Left, Right foot flat, Left foot flat, and narrow BOS, Distance walked: 10ft, Assistive device utilized:Walker - 2 wheeled, Level of assistance: Modified independence, and Comments:    FUNCTIONAL TESTS:  TUG: 25.71 seconds  DGI: next session  L SLS: <0.1 seconds  R SLS: <0.1 seconds  Norms: 18-39  F: 43.5 seconds  M: 43.2 seconds 40-49  F: 40.4 seconds  M: 40.1 seconds 50-59  F: 36 seconds  M: 38.1 seconds 60-69  F: 25.1 seconds  M: 28.7 seconds 70-79  F: 11.3 seconds  M: 18.3 seconds  PATIENT SURVEYS:  ABC Scale:950 / 1600 = 59.4 %  TREATMENT DATE:   04/27/2024 Evaluation    PATIENT EDUCATION: Education details: PT Evaluation,  findings, prognosis, frequency, attendance policy. Person educated: Patient Education method: Medical illustrator Education comprehension: verbalized understanding  HOME EXERCISE PROGRAM: To initiate next session  GOALS: Goals reviewed with patient? No  SHORT TERM GOALS: Target date: 05/25/2024  Pt will be independent with HEP in order to demonstrate participation in Physical Therapy POC.  Baseline: Goal status: INITIAL  2.  Pt will improve ABC score by 15% in order to demonstrate improved pain with functional goals and outcomes. Baseline:  Goal status: INITIAL  LONG TERM GOALS: Target date: 06/22/2024  Pt will increase DGI score by > MCID in order to demonstrate improved functional safety and balance skills in ADL/mobility.   Baseline: see objective.  Goal status: INITIAL  2.  Pt will improve TUG by least 3 seconds in order to demonstrate improved functional mobility capacity in community setting.  Baseline: see objective.  Goal status: INITIAL  3.  Pt will improve ABC score by 30% in order to demonstrate improved pain with functional goals and outcomes. Baseline: see objective.  Goal status: INITIAL  4.  Pt will improve single leg balance by at least 3 seconds bilaterally in order to demonstrate improved capacity, safety, and overall quality of movement to optimize function.  Baseline: see objective.  Goal status: INITIAL  ASSESSMENT:  CLINICAL IMPRESSION: Patient is a 68y.o. female who was seen today for physical therapy evaluation and treatment for 'compression of spinal cord.'  Patient with history of myelopathy C5 spinal fracture, with A C4 through C7 cervical fusion.  Patient presents with significant functional mobility deficits, safety concerns, increased falls risk, reduced ambulation capacity at due to muscle weakness, decreased activity tolerance, balance deficits in setting of previous cervical myelopathy pt will benefit from skilled Physical Therapy  services to address deficits/limitations in order to improve functional and QOL.    OBJECTIVE IMPAIRMENTS: Abnormal gait, decreased activity tolerance, decreased balance, decreased mobility, difficulty walking, decreased strength, and postural dysfunction.   ACTIVITY LIMITATIONS: carrying, lifting, bending, standing, squatting, stairs, transfers, bed mobility, and locomotion level  PARTICIPATION LIMITATIONS: meal prep, cleaning, laundry, community activity, occupation, and yard work  PERSONAL FACTORS: Age are also affecting patient's functional outcome.   REHAB POTENTIAL: Excellent  CLINICAL DECISION MAKING: Stable/uncomplicated  EVALUATION COMPLEXITY: Low  PLAN:  PT FREQUENCY: 2x/week  PT DURATION: 8 weeks  PLANNED INTERVENTIONS: 97164- PT Re-evaluation, 97750- Physical Performance Testing, 97110-Therapeutic exercises, 97530- Therapeutic activity, 97112- Neuromuscular re-education, 97535- Self Care, 02859- Manual therapy, [phone]- Gait training, [phone]- Orthotic Initial, Patient/Family education, Balance training, Stair training, Taping, Joint mobilization, Joint manipulation, Spinal manipulation, Spinal mobilization, Cryotherapy, and Moist heat  PLAN FOR NEXT SESSION: DGI, functional mobility, strengthening, ambulation, etc.   Omega JONETTA Donna ALMETA, DPT [facility] [address] [phone] office   Omega JONETTA Donna, PT 04/27/2024, 1:48 PM

## 2024-04-28 ENCOUNTER — Ambulatory Visit (HOSPITAL_COMMUNITY)
Admission: RE | Admit: 2024-04-28 | Discharge: 2024-04-28 | Disposition: A | Discharge status: Home / Self Care | Source: Ambulatory Visit | Attending: Nurse Practitioner | Admitting: Nurse Practitioner

## 2024-04-28 DIAGNOSIS — R911 Solitary pulmonary nodule: Secondary | ICD-10-CM | POA: Insufficient documentation

## 2024-04-28 DIAGNOSIS — Z8639 Personal history of other endocrine, nutritional and metabolic disease: Secondary | ICD-10-CM | POA: Insufficient documentation

## 2024-05-14 ENCOUNTER — Ambulatory Visit (INDEPENDENT_AMBULATORY_CARE_PROVIDER_SITE_OTHER): Admitting: Psychiatry

## 2024-05-14 ENCOUNTER — Ambulatory Visit (HOSPITAL_COMMUNITY)

## 2024-05-14 DIAGNOSIS — G9520 Unspecified cord compression: Secondary | ICD-10-CM | POA: Diagnosis not present

## 2024-05-14 DIAGNOSIS — F411 Generalized anxiety disorder: Secondary | ICD-10-CM | POA: Diagnosis not present

## 2024-05-14 DIAGNOSIS — M6281 Muscle weakness (generalized): Secondary | ICD-10-CM

## 2024-05-14 DIAGNOSIS — Z7409 Other reduced mobility: Secondary | ICD-10-CM

## 2024-05-14 NOTE — Therapy (Signed)
 OUTPATIENT PHYSICAL THERAPY NEURO TREATMENT   Patient Name: Angela Norman MRN: [id number] DOB:10-19-1956, 68 y.o., female Today's Date: 05/14/2024   PCP: Sebastian Othel GAILS, FNP REFERRING PROVIDER: Edna Norris, MD  END OF SESSION:  PT End of Session - 05/14/24 1014     Visit Number 2    Number of Visits 16    Date for PT Re-Evaluation 06/22/24    Authorization Type Medicare A & B    PT Start Time 0934    PT Stop Time 1015    PT Time Calculation (min) 41 min    Equipment Utilized During Treatment Gait belt    Activity Tolerance Patient tolerated treatment well;Patient limited by fatigue    Behavior During Therapy WFL for tasks assessed/performed           Past Medical History:  Diagnosis Date   Anxiety    Arthritis    Basal cell carcinoma    Charcot's joint of foot    bilateral   Diabetes mellitus without complication (HCC)    Fibromyalgia    Neuropathy    Past Surgical History:  Procedure Laterality Date   ACHILLES TENDON REPAIR Left    bone spur Left    --left foot   BREAST SURGERY     benign breast bx--left--fatty tissue   CARPAL TUNNEL RELEASE Bilateral    COLONOSCOPY     COLONOSCOPY WITH PROPOFOL  N/A 09/06/2022   Procedure: COLONOSCOPY WITH PROPOFOL ;  Surgeon: Shaaron Lamar HERO, MD;  [facility]: [facility];  Service: Endoscopy;  Laterality: N/A;  10:30am, asa 2   DILATION AND CURETTAGE OF UTERUS  2011   for cervical polyp   FOOT SURGERY Left 03/2018   reconstructive surgery   POLYPECTOMY  09/06/2022   Procedure: POLYPECTOMY;  Surgeon: Shaaron Lamar HERO, MD;  [facility]: [facility];  Service: Endoscopy;;   Patient Active Problem List   Diagnosis Date Noted   Anxiety 06/28/2022   Arthritis 06/28/2022   Body fluid retention 06/28/2022   Hyperglycemia due to type 2 diabetes mellitus (HCC) 06/28/2022   Mixed hyperlipidemia 06/28/2022   Fibromyalgia 06/28/2022   Cough 10/25/2021   Musculoskeletal pain 05/21/2012   Stiffness of joint, not  elsewhere classified, ankle and foot 08/13/2011   Difficulty walking 08/13/2011   Ankle weakness 08/13/2011    ONSET DATE: November 2024  REFERRING DIAG: compression of spinal cord   THERAPY DIAG:  Compression of spinal cord (HCC)  Impaired functional mobility, balance, gait, and endurance  Muscle weakness (generalized)  Rationale for Evaluation and Treatment: Rehabilitation  SUBJECTIVE:  SUBJECTIVE STATEMENT: 05/13/24:  Reports of numbness in Bil UE and LE.  Stated she has began walking with RW, able to do 9 trips around home.  Has contacted MD for OT referral.  Eval:  Pt reported all this started with UTI that caused fracture f neck and indicated as C4-C7 fusion. Pt had multiple bouts of acute and inpatient rehab stays due to complications. Significant BP issues throughout this time. Had stay in multiple facilities. Pt reported various changes in functional status, was requiring assist for ADLs and mobility since April 28th. Since then, pt has been home with Home Health services. Pt continues to report numbness in BUE and poor grip strength. Pt reports neuropathy in BLE. Prior to onset of November '24 was walking daily for 45-60 minutes.   Pt has CT scan for nodule on Lung on 04/28/24.  Pt accompanied by: self  PERTINENT HISTORY:  Cervical Myelopathy with ACDF 4-7 in November 2024\ Left ankle surgery x 3  PAIN:  Are you having pain? No and chronic pain in left shoulder  PRECAUTIONS: None  RED FLAGS: Bowel or bladder incontinence: Yes: SCI   WEIGHT BEARING RESTRICTIONS: No  FALLS: Has patient fallen in last 6 months? Yes. Number of falls 1-2   PATIENT GOALS:  want to walk with AD well and with confidence and strengthening RUE.  OBJECTIVE:  Note: Objective measures were completed at  Evaluation unless otherwise noted.  DIAGNOSTIC FINDINGS:   COGNITION: Overall cognitive status: Within functional limits for tasks assessed   SENSATION: Light touch: decreased light touch from lateral malleolus distally.   LOWER EXTREMITY ROM:     Active  Right Eval Left Eval  Hip flexion    Hip extension    Hip abduction    Hip adduction    Hip internal rotation    Hip external rotation    Knee flexion    Knee extension    Ankle dorsiflexion    Ankle plantarflexion    Ankle inversion    Ankle eversion     (Blank rows = not tested)  LOWER EXTREMITY MMT:    MMT Right Eval Left Eval  Hip flexion    Hip extension    Hip abduction    Hip adduction    Hip internal rotation    Hip external rotation    Knee flexion    Knee extension 4- 4+  Ankle dorsiflexion    Ankle plantarflexion    Ankle inversion    Ankle eversion    (Blank rows = not tested)   TRANSFERS: Sit to stand: Modified independence  Assistive device utilized: Environmental consultant - 2 wheeled     Stand to sit: Modified independence  Assistive device utilized: Environmental consultant - 2 wheeled     Chair to chair: SBA  Assistive device utilized: Environmental consultant - 2 wheeled       GAIT: Findings: Gait Characteristics: step through pattern, decreased arm swing- Right, decreased arm swing- Left, decreased step length- Right, decreased step length- Left, decreased hip/knee flexion- Right, decreased hip/knee flexion- Left, Right foot flat, Left foot flat, and narrow BOS, Distance walked: 95ft, Assistive device utilized:Walker - 2 wheeled, Level of assistance: Modified independence, and Comments:    FUNCTIONAL TESTS:  TUG: 25.71 seconds  DGI: next session  L SLS: <0.1 seconds  R SLS: <0.1 seconds  Norms: 18-39  F: 43.5 seconds  M: 43.2 seconds 40-49  F: 40.4 seconds  M: 40.1 seconds 50-59  F: 36 seconds  M: 38.1 seconds 60-69  F: 25.1 seconds  M: 28.7 seconds 70-79  F: 11.3 seconds  M: 18.3 seconds  PATIENT SURVEYS:  ABC  Scale:950 / 1600 = 59.4 %                                                                                                                              TREATMENT DATE:  05/13/24: Reviewed goals Educated importance of HEP which was established this session STS 5x no HHA  DGI 1. Gait level surface (1) Moderate Impairment: Walks 20', slow speed, abnormal gait pattern, evidence for imbalance. 2. Change in gait speed (1) Moderate Impairment: Makes only minor adjustments to walking speed, or accomplishes a change in speed with significant gait deviations, or changes speed but has significant gait deviations, or changes speed but loses balance but is able to recover and continue walking. 3. Gait with horizontal head turns (1) Moderate Impairment: Performs head turns with moderate change in gait velocity, slows down, staggers but recovers, can continue to walk. 4. Gait with vertical head turns 1) Moderate Impairment: Performs head turns with moderate change in gait velocity, slows down, staggers but recovers, can continue to walk. 5. Gait and pivot turn (1) Moderate Impairment: Turns slowly, requires verbal cueing, requires several small steps to catch balance following turn and stop. 6. Step over obstacle (1) Moderate Impairment: Is able to step over box but must stop, then step over. May require verbal cueing. 7. Step around obstacles (1) Moderate Impairment: Is able to clear cones but must significantly slow, speed to accomplish task, or requires verbal cueing. 8. Stairs (1) Moderate Impairment: Two feet to a stair, must use rail.  TOTAL SCORE: 8 / 24  Toe tapping on 6in step 10x alternating Abduction 10x each Sidestep 3RT inside // bars (2RT with RTB around thigh)  BP: 129/80 mmHg HR 74 bpm  04/27/2024 Evaluation    PATIENT EDUCATION: Education details: PT Evaluation, findings, prognosis, frequency, attendance policy. Person educated: Patient Education method: Explanation and  Demonstration Education comprehension: verbalized understanding  HOME EXERCISE PROGRAM: Access Code: [email or web address] URL: [email or web address] Date: 05/14/2024 Prepared by: Augustin Mclean  Exercises - Sit to Stand  - 1 x daily - 7 x weekly - 3 sets - 10 reps - Standing March with Counter Support  - 1 x daily - 7 x weekly - 3 sets - 10 reps - Standing Hip Abduction with Counter Support  - 1 x daily - 7 x weekly - 3 sets - 10 reps - Side Stepping with Resistance at Thighs and Counter Support  - 1 x daily - 7 x weekly - 3 sets - 10 reps  GOALS: Goals reviewed with patient? No  SHORT TERM GOALS: Target date: 05/25/2024  Pt will be independent with HEP in order to demonstrate participation in Physical Therapy POC.  Baseline: Goal status: INITIAL  2.  Pt will improve ABC score by 15% in order to demonstrate improved pain with functional goals and outcomes.  Baseline:  Goal status: INITIAL  LONG TERM GOALS: Target date: 06/22/2024  Pt will increase DGI score by > MCID in order to demonstrate improved functional safety and balance skills in ADL/mobility.   Baseline: see objective.  Goal status: INITIAL  2.  Pt will improve TUG by least 3 seconds in order to demonstrate improved functional mobility capacity in community setting.  Baseline: see objective.  Goal status: INITIAL  3.  Pt will improve ABC score by 30% in order to demonstrate improved pain with functional goals and outcomes. Baseline: see objective.  Goal status: INITIAL  4.  Pt will improve single leg balance by at least 3 seconds bilaterally in order to demonstrate improved capacity, safety, and overall quality of movement to optimize function.  Baseline: see objective.  Goal status: INITIAL  ASSESSMENT:  CLINICAL IMPRESSION: 05/14/24:  Reviewed goals and educated importance of HEP compliance which was established this session.  DGI complete with score 8/24 indication pt at risk of falls, encouraged to continue  ambulating with RW for safety.  Added hip strengthening exercises and balance activities that was tolerated well, pt able to complete with good form and mechanics with HHA.  HEP built and given to pt with printout given and verbalized understanding.  No reports of pain through session, was limited by fatigue with activities.    Eval:  Patient is a 68y.o. female who was seen today for physical therapy evaluation and treatment for 'compression of spinal cord.'  Patient with history of myelopathy C5 spinal fracture, with A C4 through C7 cervical fusion.  Patient presents with significant functional mobility deficits, safety concerns, increased falls risk, reduced ambulation capacity at due to muscle weakness, decreased activity tolerance, balance deficits in setting of previous cervical myelopathy pt will benefit from skilled Physical Therapy services to address deficits/limitations in order to improve functional and QOL.    OBJECTIVE IMPAIRMENTS: Abnormal gait, decreased activity tolerance, decreased balance, decreased mobility, difficulty walking, decreased strength, and postural dysfunction.   ACTIVITY LIMITATIONS: carrying, lifting, bending, standing, squatting, stairs, transfers, bed mobility, and locomotion level  PARTICIPATION LIMITATIONS: meal prep, cleaning, laundry, community activity, occupation, and yard work  PERSONAL FACTORS: Age are also affecting patient's functional outcome.   REHAB POTENTIAL: Excellent  CLINICAL DECISION MAKING: Stable/uncomplicated  EVALUATION COMPLEXITY: Low  PLAN:  PT FREQUENCY: 2x/week  PT DURATION: 8 weeks  PLANNED INTERVENTIONS: 97164- PT Re-evaluation, 97750- Physical Performance Testing, 97110-Therapeutic exercises, 97530- Therapeutic activity, 97112- Neuromuscular re-education, 97535- Self Care, 02859- Manual therapy, [phone]- Gait training, [phone]- Orthotic Initial, Patient/Family education, Balance training, Stair training, Taping, Joint mobilization,  Joint manipulation, Spinal manipulation, Spinal mobilization, Cryotherapy, and Moist heat  PLAN FOR NEXT SESSION: Functional mobility, strengthening, ambulation, etc.   Augustin Mclean, LPTA/CLT; CBIS [phone]  Mclean Augustin Amble, PTA 05/14/2024, 12:21 PM

## 2024-05-14 NOTE — Progress Notes (Signed)
 IN-PERSON  Comprehensive Clinical Assessment (CCA) Note  05/14/2024 Angela Norman [id number]  Chief Complaint: Stress, Anxiety  Visit Diagnosis: Generalized anxiety disorder    CCA Biopsychosocial Intake/Chief Complaint:  Ineed to talk to someone, I I had spinal cord injury on November 14, managed transitions regarding surgery/treatment/rehab but have been more anxious since I have been home since April, expereinceing loneliness, also concerns about husband going into deep depression when I had the injury, he just completed TMS, is slightly better  Current Symptoms/Problems: irritalbility, ruminating about the past, restlessness, worrying about familyand the future especially my daughter's health and anxiety   Patient Reported Schizophrenia/Schizoaffective Diagnosis in Past: No data recorded  Strengths: optimistic overall, encourager/ good listener  Preferences: Individual therapy  Abilities: No data recorded  Type of Services Patient Feels are Needed: Individual therapy- feel better about my life and adjustments   Initial Clinical Notes/Concerns: Pt is a returning pt to this clinician and last was seen about 9 months ago.  r. Pt reports no psychiatric hospitalizations. She reports no previous involvement in therapy.   Mental Health Symptoms Depression:  Irritability; Change in energy/activity; Fatigue; Tearfulness   Duration of Depressive symptoms: No data recorded  Mania:  No data recorded  Anxiety:   Irritability; Restlessness; Tension; Worrying; Fatigue   Psychosis:  None   Duration of Psychotic symptoms: No data recorded  Trauma:  None (pt suffered a spinal cord injury and broke neck)   Obsessions:  None   Compulsions:  None   Inattention:  None   Hyperactivity/Impulsivity:  None   Oppositional/Defiant Behaviors:  None   Emotional Irregularity:  None   Other Mood/Personality Symptoms:  No data recorded   Mental Status Exam Appearance and  self-care  Stature:  Tall   Weight:  Overweight   Clothing:  Casual   Grooming:  Normal   Cosmetic use:  None   Posture/gait:  -- (uses a walker)   Motor activity:  Not Remarkable   Sensorium  Attention:  Normal   Concentration:  Normal   Orientation:  X5   Recall/memory:  Normal   Affect and Mood  Affect:  Anxious   Mood:  Anxious   Relating  Eye contact:  Normal   Facial expression:  Responsive   Attitude toward examiner:  Cooperative   Thought and Language  Speech flow: Soft   Thought content:  Appropriate to Mood and Circumstances   Preoccupation:  Ruminations   Hallucinations:  None   Organization:  No data recorded  [employer] of Knowledge:  Good   Intelligence:  Average   Abstraction:  Normal   Judgement:  Good   Reality Testing:  Realistic   Insight:  Good   Decision Making:  Normal   Social Functioning  Social Maturity:  Responsible   Social Judgement:  Normal   Stress  Stressors:  Transitions   Coping Ability:  Human resources officer Deficits:  None   Supports:  Family; Friends/Service system     Religion: Religion/Spirituality Are You A Religious Person?: Yes How Might This Affect Treatment?: No effects  Leisure/Recreation: Leisure / Recreation Do You Have Hobbies?: Yes Leisure and Hobbies: reading. 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?:  No  Education: Education Did Garment/textile technologist From [employer]?: Yes Did Theme park manager?: Yes What Type of College Degree Do you Have?: BA English [employer] Did [place] Attend Graduate School?: No Did You Have An Individualized Education Program (IIEP): No Did You Have Any Difficulty At School?: No (was very shy)   CCA Family/Childhood History Family and Relationship History: Family history Are you sexually active?: No Does patient have children?: Yes (28 yo son, 17 yo daughter) How many children?: 2 How is patient's relationship with their children?: great  Childhood History:  Childhood History By whom was/is the patient raised?: Both parents Additional childhood history information: Pt was born and reared in [place] . Description of patient's relationship with caregiver when they were a child: wonderful, great childhood Patient's description of current relationship with people who raised him/her: deceased How were you disciplined when you got in trouble as a child/adolescent?: didn't get in trouble very much, threatned with a switch but don't remember it ever being used Does patient have siblings?: No Did patient suffer any verbal/emotional/physical/sexual abuse as a child?: No Did patient suffer from severe childhood neglect?: No Has patient ever been sexually abused/assaulted/raped as an adolescent or adult?: No Was the patient ever a victim of a crime or a disaster?: No Witnessed domestic violence?: No Has patient been affected by domestic violence as an adult?: No  Child/Adolescent Assessment: N/A     CCA Substance Use Alcohol/Drug Use: Alcohol / Drug Use Pain Medications: see patient record Prescriptions: see patient record Over the Counter: see patient recoerd History of alcohol / drug use?: No history of alcohol / drug abuse  None  ASAM's:  Six Dimensions of Multidimensional Assessment  Dimension 1:  Acute Intoxication and/or Withdrawal Potential:    Dimension 1:  Description of individual's past and current experiences of substance use and withdrawal: none  Dimension 2:  Biomedical Conditions and Complications:   Dimension 2:  Description of patient's biomedical conditions and  complications: none  Dimension 3:  Emotional, Behavioral, or Cognitive Conditions and Complications:  Dimension 3:  Description of emotional, behavioral, or cognitive conditions and complications: none  Dimension 4:  Readiness to Change:  Dimension 4:  Description of Readiness to Change criteria: none  Dimension 5:  Relapse, Continued use, or Continued Problem Potential:  Dimension 5:  Relapse, continued use, or continued problem potential critiera description: none  Dimension 6:  Recovery/Living Environment:  Dimension 6:  Recovery/Iiving environment criteria description: none  ASAM Severity Score: ASAM's Severity Rating Score: 0  ASAM Recommended Level of Treatment:     Substance use Disorder (SUD) None  Recommendations for Services/Supports/Treatments: Recommendations for Services/Supports/Treatments Recommendations For Services/Supports/Treatments: Individual Therapy patient attends assessment appointment today.  Nutritional assessment, pain assessment, PHQ 2, C-S SRS, GAD-7 administered.  Individual therapy is recommended 1 time every 1 to 4 weeks to improve coping skills to manage recent transitions, anxiety, and stress.  Patient agrees to return for an appointment in 2 to 4 weeks  DSM5 Diagnoses: Patient Active Problem List   Diagnosis Date Noted   Anxiety 06/28/2022   Arthritis 06/28/2022   Body fluid retention 06/28/2022   Hyperglycemia due to type 2 diabetes mellitus (HCC) 06/28/2022   Mixed hyperlipidemia 06/28/2022   Fibromyalgia 06/28/2022  Cough 10/25/2021   Musculoskeletal pain 05/21/2012   Stiffness of joint, not elsewhere classified, ankle and foot 08/13/2011   Difficulty walking 08/13/2011   Ankle weakness 08/13/2011    Patient  Centered Plan: Patient is on the following Treatment Plan(s): Will be developed next session   Referrals to Alternative Service(s): Referred to Alternative Service(s):   Place:   Date:   Time:    Referred to Alternative Service(s):   Place:   Date:   Time:    Referred to Alternative Service(s):   Place:   Date:   Time:    Referred to Alternative Service(s):   Place:   Date:   Time:      Collaboration of Care: Other none done at this session  Patient/Guardian was advised Release of Information must be obtained prior to any record release in order to collaborate their care with an outside provider. Patient/Guardian was advised if they have not already done so to contact the registration department to sign all necessary forms in order for us  to release information regarding their care.   Consent: Patient/Guardian gives verbal consent for treatment and assignment of benefits for services provided during this visit. Patient/Guardian expressed understanding and agreed to proceed.   Chrysten Woulfe E Dudley Mages, LCSW

## 2024-05-18 ENCOUNTER — Other Ambulatory Visit (HOSPITAL_COMMUNITY): Payer: Self-pay | Admitting: Obstetrics and Gynecology

## 2024-05-18 DIAGNOSIS — Z1231 Encounter for screening mammogram for malignant neoplasm of breast: Secondary | ICD-10-CM

## 2024-05-19 ENCOUNTER — Encounter (HOSPITAL_COMMUNITY): Payer: Self-pay

## 2024-05-19 ENCOUNTER — Ambulatory Visit (HOSPITAL_COMMUNITY)

## 2024-05-19 ENCOUNTER — Ambulatory Visit (HOSPITAL_COMMUNITY): Admitting: Occupational Therapy

## 2024-05-19 ENCOUNTER — Encounter (HOSPITAL_COMMUNITY): Payer: Self-pay | Admitting: Occupational Therapy

## 2024-05-19 DIAGNOSIS — M6281 Muscle weakness (generalized): Secondary | ICD-10-CM

## 2024-05-19 DIAGNOSIS — G9520 Unspecified cord compression: Secondary | ICD-10-CM

## 2024-05-19 DIAGNOSIS — Z7409 Other reduced mobility: Secondary | ICD-10-CM

## 2024-05-19 DIAGNOSIS — R29818 Other symptoms and signs involving the nervous system: Secondary | ICD-10-CM

## 2024-05-19 DIAGNOSIS — R278 Other lack of coordination: Secondary | ICD-10-CM

## 2024-05-19 NOTE — Therapy (Signed)
 OUTPATIENT PHYSICAL THERAPY NEURO TREATMENT   Patient Name: Angela Norman MRN: [id number] DOB:1956-06-05, 68 y.o., female Today's Date: 05/19/2024   PCP: Sebastian Othel GAILS, FNP REFERRING PROVIDER: Edna Norris, MD  END OF SESSION:  PT End of Session - 05/19/24 0933     Visit Number 3    Number of Visits 16    Date for PT Re-Evaluation 06/22/24    Authorization Type Medicare A & B    PT Start Time 0934    PT Stop Time 1022    PT Time Calculation (min) 48 min    Equipment Utilized During Treatment Gait belt    Activity Tolerance Patient tolerated treatment well;Patient limited by fatigue    Behavior During Therapy WFL for tasks assessed/performed           Past Medical History:  Diagnosis Date   Anxiety    Arthritis    Basal cell carcinoma    Charcot's joint of foot    bilateral   Diabetes mellitus without complication (HCC)    Fibromyalgia    Neuropathy    Past Surgical History:  Procedure Laterality Date   ACHILLES TENDON REPAIR Left    bone spur Left    --left foot   BREAST SURGERY     benign breast bx--left--fatty tissue   CARPAL TUNNEL RELEASE Bilateral    COLONOSCOPY     COLONOSCOPY WITH PROPOFOL  N/A 09/06/2022   Procedure: COLONOSCOPY WITH PROPOFOL ;  Surgeon: Shaaron Lamar HERO, MD;  Location: [facility];  Service: Endoscopy;  Laterality: N/A;  10:30am, asa 2   DILATION AND CURETTAGE OF UTERUS  2011   for cervical polyp   FOOT SURGERY Left 03/2018   reconstructive surgery   POLYPECTOMY  09/06/2022   Procedure: POLYPECTOMY;  Surgeon: Shaaron Lamar HERO, MD;  Location: [facility];  Service: Endoscopy;;   Patient Active Problem List   Diagnosis Date Noted   Anxiety 06/28/2022   Arthritis 06/28/2022   Body fluid retention 06/28/2022   Hyperglycemia due to type 2 diabetes mellitus (HCC) 06/28/2022   Mixed hyperlipidemia 06/28/2022   Fibromyalgia 06/28/2022   Cough 10/25/2021   Musculoskeletal pain 05/21/2012   Stiffness of joint, not  elsewhere classified, ankle and foot 08/13/2011   Difficulty walking 08/13/2011   Ankle weakness 08/13/2011    ONSET DATE: November 2024  REFERRING DIAG: compression of spinal cord   THERAPY DIAG:  Compression of spinal cord (HCC)  Impaired functional mobility, balance, gait, and endurance  Muscle weakness (generalized)  Rationale for Evaluation and Treatment: Rehabilitation  SUBJECTIVE:  SUBJECTIVE STATEMENT: 05/19/24:  Reports of numbness in Bil UE and LE, does have some pain that feels like a strain behind Rt knee, pain scale 1-2/10.  Has been completeing exericses daily and ability to complete 10 laps around home.  Feels she is getting stronger with Rt LE.    Eval:  Pt reported all this started with UTI that caused fracture f neck and indicated as C4-C7 fusion. Pt had multiple bouts of acute and inpatient rehab stays due to complications. Significant BP issues throughout this time. Had stay in multiple facilities. Pt reported various changes in functional status, was requiring assist for ADLs and mobility since April 28th. Since then, pt has been home with Home Health services. Pt continues to report numbness in BUE and poor grip strength. Pt reports neuropathy in BLE. Prior to onset of November '24 was walking daily for 45-60 minutes.   Pt has CT scan for nodule on Lung on 04/28/24.  Pt accompanied by: self  PERTINENT HISTORY:  Cervical Myelopathy with ACDF 4-7 in November 2024\ Left ankle surgery x 3  PAIN:  Are you having pain? No and chronic pain in left shoulder  PRECAUTIONS: None  RED FLAGS: Bowel or bladder incontinence: Yes: SCI   WEIGHT BEARING RESTRICTIONS: No  FALLS: Has patient fallen in last 6 months? Yes. Number of falls 1-2   PATIENT GOALS:  want to walk with AD well and  with confidence and strengthening RUE.  OBJECTIVE:  Note: Objective measures were completed at Evaluation unless otherwise noted.  DIAGNOSTIC FINDINGS:   COGNITION: Overall cognitive status: Within functional limits for tasks assessed   SENSATION: Light touch: decreased light touch from lateral malleolus distally.   LOWER EXTREMITY ROM:     Active  Right Eval Left Eval  Hip flexion    Hip extension    Hip abduction    Hip adduction    Hip internal rotation    Hip external rotation    Knee flexion    Knee extension    Ankle dorsiflexion    Ankle plantarflexion    Ankle inversion    Ankle eversion     (Blank rows = not tested)  LOWER EXTREMITY MMT:    MMT Right Eval Left Eval  Hip flexion    Hip extension    Hip abduction    Hip adduction    Hip internal rotation    Hip external rotation    Knee flexion    Knee extension 4- 4+  Ankle dorsiflexion    Ankle plantarflexion    Ankle inversion    Ankle eversion    (Blank rows = not tested)   TRANSFERS: Sit to stand: Modified independence  Assistive device utilized: Environmental consultant - 2 wheeled     Stand to sit: Modified independence  Assistive device utilized: Environmental consultant - 2 wheeled     Chair to chair: SBA  Assistive device utilized: Environmental consultant - 2 wheeled       GAIT: Findings: Gait Characteristics: step through pattern, decreased arm swing- Right, decreased arm swing- Left, decreased step length- Right, decreased step length- Left, decreased hip/knee flexion- Right, decreased hip/knee flexion- Left, Right foot flat, Left foot flat, and narrow BOS, Distance walked: 26ft, Assistive device utilized:Walker - 2 wheeled, Level of assistance: Modified independence, and Comments:    FUNCTIONAL TESTS:  TUG: 25.71 seconds  DGI: 05/13/24: 8 / 24  L SLS: <0.1 seconds  R SLS: <0.1 seconds  Norms: 18-39  F: 43.5 seconds  M: 43.2 seconds  40-49  F: 40.4 seconds  M: 40.1 seconds 50-59  F: 36 seconds  M: 38.1 seconds 60-69  F:  25.1 seconds  M: 28.7 seconds 70-79  F: 11.3 seconds  M: 18.3 seconds  PATIENT SURVEYS:  ABC Scale:950 / 1600 = 59.4 %                                                                                                                              TREATMENT DATE:  05/19/24: BP 130/66 mmHg HR 83 bpm  STS 10x no HHA Toe tapping 6in step 2x 10 alternating 2 HHA 1st set, 1 HHA Sidestep with RTB around thigh 3RT Tandem stance with 1 HHA 2x 30 Tandem gait 2RT inside // bars Hamstring curls 10x each SLS with 1 heavy UE support x 30 (reports of LBP following this exercise)  Supine: Decompression 5x 5  05/13/24: Reviewed goals Educated importance of HEP which was established this session STS 5x no HHA  DGI 1. Gait level surface (1) Moderate Impairment: Walks 20', slow speed, abnormal gait pattern, evidence for imbalance. 2. Change in gait speed (1) Moderate Impairment: Makes only minor adjustments to walking speed, or accomplishes a change in speed with significant gait deviations, or changes speed but has significant gait deviations, or changes speed but loses balance but is able to recover and continue walking. 3. Gait with horizontal head turns (1) Moderate Impairment: Performs head turns with moderate change in gait velocity, slows down, staggers but recovers, can continue to walk. 4. Gait with vertical head turns 1) Moderate Impairment: Performs head turns with moderate change in gait velocity, slows down, staggers but recovers, can continue to walk. 5. Gait and pivot turn (1) Moderate Impairment: Turns slowly, requires verbal cueing, requires several small steps to catch balance following turn and stop. 6. Step over obstacle (1) Moderate Impairment: Is able to step over box but must stop, then step over. May require verbal cueing. 7. Step around obstacles (1) Moderate Impairment: Is able to clear cones but must significantly slow, speed to accomplish task, or requires verbal  cueing. 8. Stairs (1) Moderate Impairment: Two feet to a stair, must use rail.  TOTAL SCORE: 8 / 24  Toe tapping on 6in step 10x alternating Abduction 10x each Sidestep 3RT inside // bars (2RT with RTB around thigh)  BP: 129/80 mmHg HR 74 bpm  04/27/2024 Evaluation    PATIENT EDUCATION: Education details: PT Evaluation, findings, prognosis, frequency, attendance policy. Person educated: Patient Education method: Explanation and Demonstration Education comprehension: verbalized understanding  HOME EXERCISE PROGRAM: Access Code: [email or web address] URL: [email or web address] Date: 05/14/2024 Prepared by: Augustin Mclean  Exercises - Sit to Stand  - 1 x daily - 7 x weekly - 3 sets - 10 reps - Standing March with Counter Support  - 1 x daily - 7 x weekly - 3 sets - 10 reps - Standing Hip Abduction with Counter Support  - 1 x daily - 7 x  weekly - 3 sets - 10 reps - Side Stepping with Resistance at Thighs and Counter Support  - 1 x daily - 7 x weekly - 3 sets - 10 reps  GOALS: Goals reviewed with patient? No  SHORT TERM GOALS: Target date: 05/25/2024  Pt will be independent with HEP in order to demonstrate participation in Physical Therapy POC.  Baseline: Goal status: INITIAL  2.  Pt will improve ABC score by 15% in order to demonstrate improved pain with functional goals and outcomes. Baseline:  Goal status: INITIAL  LONG TERM GOALS: Target date: 06/22/2024  Pt will increase DGI score by > MCID in order to demonstrate improved functional safety and balance skills in ADL/mobility.   Baseline: see objective.  Goal status: INITIAL  2.  Pt will improve TUG by least 3 seconds in order to demonstrate improved functional mobility capacity in community setting.  Baseline: see objective.  Goal status: INITIAL  3.  Pt will improve ABC score by 30% in order to demonstrate improved pain with functional goals and outcomes. Baseline: see objective.  Goal status: INITIAL  4.  Pt  will improve single leg balance by at least 3 seconds bilaterally in order to demonstrate improved capacity, safety, and overall quality of movement to optimize function.  Baseline: see objective.  Goal status: INITIAL  ASSESSMENT:  CLINICAL IMPRESSION: 05/19/24:  Added standing LE and balance training with good tolerance and form following initial cueing.  Pt c/o LBP following SLS. Added decompression with reports of pain resolved.  Pt stated she has swelling in ankles, pt educated on benefits of compression garments for edema control.  Measurements taken and given handout for ETI.  Eval:  Patient is a 68y.o. female who was seen today for physical therapy evaluation and treatment for 'compression of spinal cord.'  Patient with history of myelopathy C5 spinal fracture, with A C4 through C7 cervical fusion.  Patient presents with significant functional mobility deficits, safety concerns, increased falls risk, reduced ambulation capacity at due to muscle weakness, decreased activity tolerance, balance deficits in setting of previous cervical myelopathy pt will benefit from skilled Physical Therapy services to address deficits/limitations in order to improve functional and QOL.    OBJECTIVE IMPAIRMENTS: Abnormal gait, decreased activity tolerance, decreased balance, decreased mobility, difficulty walking, decreased strength, and postural dysfunction.   ACTIVITY LIMITATIONS: carrying, lifting, bending, standing, squatting, stairs, transfers, bed mobility, and locomotion level  PARTICIPATION LIMITATIONS: meal prep, cleaning, laundry, community activity, occupation, and yard work  PERSONAL FACTORS: Age are also affecting patient's functional outcome.   REHAB POTENTIAL: Excellent  CLINICAL DECISION MAKING: Stable/uncomplicated  EVALUATION COMPLEXITY: Low  PLAN:  PT FREQUENCY: 2x/week  PT DURATION: 8 weeks  PLANNED INTERVENTIONS: 97164- PT Re-evaluation, 97750- Physical Performance Testing,  97110-Therapeutic exercises, 97530- Therapeutic activity, [id number]- Neuromuscular re-education, 97535- Self Care, [id number]- Manual therapy, [phone]- Gait training, [phone]- Orthotic Initial, Patient/Family education, Balance training, Stair training, Taping, Joint mobilization, Joint manipulation, Spinal manipulation, Spinal mobilization, Cryotherapy, and Moist heat  PLAN FOR NEXT SESSION: Functional mobility, strengthening, ambulation, etc. Educated donning compression garments.  Augustin Mclean, LPTA/CLT; CBIS [phone]  Mclean Augustin Amble, PTA 05/19/2024, 5:18 PM

## 2024-05-19 NOTE — Therapy (Signed)
 OUTPATIENT OCCUPATIONAL THERAPY NEURO EVALUATION  Patient Name: Angela Norman MRN: [id number] DOB:September 08, 1956, 68 y.o., female Today's Date: 05/19/2024  PCP: Shona Norleen PEDLAR, MD REFERRING PROVIDER: Hillman Amour, MD  END OF SESSION:  OT End of Session - 05/19/24 1433     Visit Number 1    Number of Visits 9    Date for OT Re-Evaluation 06/19/24    Authorization Type Medicare Part A and B    OT Start Time 1039    OT Stop Time 1117    OT Time Calculation (min) 38 min    Activity Tolerance Patient tolerated treatment well    Behavior During Therapy WFL for tasks assessed/performed          Past Medical History:  Diagnosis Date   Anxiety    Arthritis    Basal cell carcinoma    Charcot's joint of foot    bilateral   Diabetes mellitus without complication (HCC)    Fibromyalgia    Neuropathy    Past Surgical History:  Procedure Laterality Date   ACHILLES TENDON REPAIR Left    bone spur Left    --left foot   BREAST SURGERY     benign breast bx--left--fatty tissue   CARPAL TUNNEL RELEASE Bilateral    COLONOSCOPY     COLONOSCOPY WITH PROPOFOL  N/A 09/06/2022   Procedure: COLONOSCOPY WITH PROPOFOL ;  Surgeon: Shaaron Lamar HERO, MD;  [facility];  Service: Endoscopy;  Laterality: N/A;  10:30am, asa 2   DILATION AND CURETTAGE OF UTERUS  2011   for cervical polyp   FOOT SURGERY Left 03/2018   reconstructive surgery   POLYPECTOMY  09/06/2022   Procedure: POLYPECTOMY;  Surgeon: Shaaron Lamar HERO, MD;  Location: [facility];  Service: Endoscopy;;   Patient Active Problem List   Diagnosis Date Noted   Anxiety 06/28/2022   Arthritis 06/28/2022   Body fluid retention 06/28/2022   Hyperglycemia due to type 2 diabetes mellitus (HCC) 06/28/2022   Mixed hyperlipidemia 06/28/2022   Fibromyalgia 06/28/2022   Cough 10/25/2021   Musculoskeletal pain 05/21/2012   Stiffness of joint, not elsewhere classified, ankle and foot 08/13/2011   Difficulty walking  08/13/2011   Ankle weakness 08/13/2011    ONSET DATE: 08/2023  REFERRING DIAG:  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 Pt had multiple bouts of acute and inpatient rehab stays due to complications. Significant BP issues throughout this time. Had stay in multiple facilities. Pt reported various changes in functional status, was requiring assist for ADLs and mobility since April 28th. Since then, pt has been home with Home Health services. Pt continues to report numbness in BUE and poor grip strength. Pt reports neuropathy in BLE. Prior to onset of November '24 was walking daily for 45-60 minutes.   PRECAUTIONS: Fall  WEIGHT BEARING RESTRICTIONS: No  PAIN:  Are you having pain? No  FALLS: Has patient fallen in last 6 months? No  LIVING ENVIRONMENT: Lives with: lives with their spouse Lives in: House/apartment Stairs: Pt lives in the basement with no stairs Has following equipment at home: Vannie - 2 wheeled and shower  chair  PLOF: Independent  PATIENT GOALS: To improve mobility and strengthening  OBJECTIVE:  Note: Objective measures were completed at Evaluation unless otherwise noted.  HAND DOMINANCE: Right  ADLs: Overall ADLs: Pt has difficulty with all small object manipulation, including but not limited to buttons, shoe laces, and zippers. Additionally she has max difficulty with cooking and cleaning due to weakness in both hands and wrists.   MOBILITY STATUS: Hx of falls, difficulty with turns, and difficulty carrying objections with ambulation  POSTURE  COMMENTS:  rounded shoulders Sitting balance: Moves/returns truncal midpoint 1-2 inches in multiple planes  ACTIVITY TOLERANCE: Activity tolerance: Fair tolerance - fatigues quicker than normal  FUNCTIONAL OUTCOME MEASURES: Quick Dash: 47.73  UPPER EXTREMITY ROM:    All ROM is WFL  UPPER EXTREMITY MMT:     MMT Right eval Left eval  Shoulder flexion 5/5 5/5  Shoulder abduction 5/5 5/5  Shoulder internal rotation 5/5 5/5  Shoulder external rotation 5/5 5/5  Elbow flexion 5/5 5/5  Elbow extension 5/5 5/5  Wrist flexion 4/5 4+/5  Wrist extension 4-/5 4/5  Wrist ulnar deviation 4-/5 4/5  Wrist radial deviation 4-/5 4-/5  Wrist pronation 4/5 4+/5  Wrist supination 4/5 4/5  (Blank rows = not tested)  HAND FUNCTION: Grip strength: Right: 20 lbs; Left: 27 lbs, Lateral pinch: Right: 5 lbs, Left: 9 lbs, and 3 point pinch: Right: 5 lbs, Left: 4 lbs  COORDINATION: 9 Hole Peg test: Right: -- sec; Left: -- sec  SENSATION: Pt has full numbness along median nerve of R hand as well as dulled sensation and tingling in BUE up to elbows.   EDEMA: No swelling noted  OBSERVATIONS: adducted thumbs and 80% grip                                                                                                                             TREATMENT DATE: Evaluation Only         PATIENT EDUCATION: Education details: Will Start Next Session Person educated: Patient Education method: Explanation, Demonstration, and Handouts Education comprehension: verbalized understanding and returned demonstration  HOME EXERCISE PROGRAM: Will Start next session   GOALS: Goals reviewed with patient? Yes  SHORT TERM GOALS: Target date: 06/19/24  Pt will be provided and educated on a comprehensive HEP for BUE mobility in order to complete ADL's and IADL's independently.   Goal status: INITIAL  2.  Pt will improve BUE strength to 5/5 in order to lift and carry items during cooking and cleaning  tasks.   Goal status: INITIAL  3.  Pt will improve BUE grip by 15# and pinch by 3# in order to grasp and hold pots and pans.  Goal status: INITIAL  4.  Pt will improve BUE coordination by completing 9 hole peg test in 45 or less in order to manipulate and complete fine motor tasks.   Goal status: INITIAL   ASSESSMENT:  CLINICAL IMPRESSION: Patient is a 68 y.o.  female who was seen today for occupational therapy evaluation for BUE weakness s/p spinal cord compression with sensory deficits. She presents with poor fine motor and gross motor skills, as well as weakness.    PERFORMANCE DEFICITS: in functional skills including ADLs, IADLs, coordination, dexterity, sensation, ROM, strength, pain, fascial restrictions, Fine motor control, Gross motor control, body mechanics, and UE functional use.  IMPAIRMENTS: are limiting patient from ADLs, IADLs, rest and sleep, work, leisure, and social participation.   CO-MORBIDITIES: may have co-morbidities  that affects occupational performance. Patient will benefit from skilled OT to address above impairments and improve overall function.  MODIFICATION OR ASSISTANCE TO COMPLETE EVALUATION: Min-Moderate modification of tasks or assist with assess necessary to complete an evaluation.  OT OCCUPATIONAL PROFILE AND HISTORY: Detailed assessment: Review of records and additional review of physical, cognitive, psychosocial history related to current functional performance.  CLINICAL DECISION MAKING: Moderate - several treatment options, min-mod task modification necessary  REHAB POTENTIAL: Good  EVALUATION COMPLEXITY: Moderate    PLAN:  OT FREQUENCY: 2x/week  OT DURATION: 4 weeks  PLANNED INTERVENTIONS: 97168 OT Re-evaluation, 97535 self care/ADL training, 02889 therapeutic exercise, 97530 therapeutic activity, 97112 neuromuscular re-education, 97140 manual therapy, 97035 ultrasound, 97018 paraffin, 02989 moist heat, 97032 electrical stimulation  (manual), passive range of motion, functional mobility training, energy conservation, coping strategies training, patient/family education, and DME and/or AE instructions  RECOMMENDED OTHER SERVICES: N/A  CONSULTED AND AGREED WITH PLAN OF CARE: Patient  PLAN FOR NEXT SESSION: Wrist and Digit ROM, strengthening, coordination tasks   Valentin Nightingale, OTR/L [facility] [phone] Valentin Jillyn Nightingale, OT 05/19/2024, 2:49 PM

## 2024-05-21 ENCOUNTER — Encounter (HOSPITAL_COMMUNITY): Payer: Self-pay

## 2024-05-21 ENCOUNTER — Ambulatory Visit (HOSPITAL_COMMUNITY)

## 2024-05-21 DIAGNOSIS — M6281 Muscle weakness (generalized): Secondary | ICD-10-CM

## 2024-05-21 DIAGNOSIS — R278 Other lack of coordination: Secondary | ICD-10-CM

## 2024-05-21 DIAGNOSIS — Z7409 Other reduced mobility: Secondary | ICD-10-CM

## 2024-05-21 DIAGNOSIS — G9520 Unspecified cord compression: Secondary | ICD-10-CM

## 2024-05-21 DIAGNOSIS — R29818 Other symptoms and signs involving the nervous system: Secondary | ICD-10-CM

## 2024-05-21 NOTE — Therapy (Signed)
 OUTPATIENT PHYSICAL THERAPY NEURO TREATMENT   Patient Name: Angela Norman MRN: [id number] DOB:April 09, 1956, 68 y.o., female Today's Date: 05/21/2024   PCP: Sebastian Othel GAILS, FNP REFERRING PROVIDER: Edna Norris, MD  END OF SESSION:  PT End of Session - 05/21/24 0928     Visit Number 4    Number of Visits 16    Date for PT Re-Evaluation 06/22/24    Authorization Type Medicare A & B    PT Start Time 0930    PT Stop Time 1010    PT Time Calculation (min) 40 min    Equipment Utilized During Treatment Gait belt    Activity Tolerance Patient tolerated treatment well;Patient limited by fatigue    Behavior During Therapy WFL for tasks assessed/performed            Past Medical History:  Diagnosis Date   Anxiety    Arthritis    Basal cell carcinoma    Charcot's joint of foot    bilateral   Diabetes mellitus without complication (HCC)    Fibromyalgia    Neuropathy    Past Surgical History:  Procedure Laterality Date   ACHILLES TENDON REPAIR Left    bone spur Left    --left foot   BREAST SURGERY     benign breast bx--left--fatty tissue   CARPAL TUNNEL RELEASE Bilateral    COLONOSCOPY     COLONOSCOPY WITH PROPOFOL  N/A 09/06/2022   Procedure: COLONOSCOPY WITH PROPOFOL ;  Surgeon: Shaaron Lamar HERO, MD;  [facility]: [facility];  Service: Endoscopy;  Laterality: N/A;  10:30am, asa 2   DILATION AND CURETTAGE OF UTERUS  2011   for cervical polyp   FOOT SURGERY Left 03/2018   reconstructive surgery   POLYPECTOMY  09/06/2022   Procedure: POLYPECTOMY;  Surgeon: Shaaron Lamar HERO, MD;  [facility]: [facility];  Service: Endoscopy;;   Patient Active Problem List   Diagnosis Date Noted   Anxiety 06/28/2022   Arthritis 06/28/2022   Body fluid retention 06/28/2022   Hyperglycemia due to type 2 diabetes mellitus (HCC) 06/28/2022   Mixed hyperlipidemia 06/28/2022   Fibromyalgia 06/28/2022   Cough 10/25/2021   Musculoskeletal pain 05/21/2012   Stiffness of joint,  not elsewhere classified, ankle and foot 08/13/2011   Difficulty walking 08/13/2011   Ankle weakness 08/13/2011    ONSET DATE: November 2024  REFERRING DIAG: compression of spinal cord   THERAPY DIAG:  Other lack of coordination  Other symptoms and signs involving the nervous system  Compression of spinal cord (HCC)  Impaired functional mobility, balance, gait, and endurance  Muscle weakness (generalized)  Rationale for Evaluation and Treatment: Rehabilitation  SUBJECTIVE:  SUBJECTIVE STATEMENT: Pt states she feels her balance has improved some but still feels balance is still off. Pt states that foot surgery and neuropathy add to balance issues. Pt states RLE is the weakest at this time. Pt states Right knee has started to buckle some and has had some increased pain about a month ago.   Eval:  Pt reported all this started with UTI that caused fracture f neck and indicated as C4-C7 fusion. Pt had multiple bouts of acute and inpatient rehab stays due to complications. Significant BP issues throughout this time. Had stay in multiple facilities. Pt reported various changes in functional status, was requiring assist for ADLs and mobility since April 28th. Since then, pt has been home with Home Health services. Pt continues to report numbness in BUE and poor grip strength. Pt reports neuropathy in BLE. Prior to onset of November '24 was walking daily for 45-60 minutes.   Pt has CT scan for nodule on Lung on 04/28/24.  Pt accompanied by: self  PERTINENT HISTORY:  Cervical Myelopathy with ACDF 4-7 in November 2024\ Left ankle surgery x 3  PAIN:  Are you having pain? No and chronic pain in left shoulder  PRECAUTIONS: None  RED FLAGS: Bowel or bladder incontinence: Yes: SCI   WEIGHT BEARING  RESTRICTIONS: No  FALLS: Has patient fallen in last 6 months? Yes. Number of falls 1-2   PATIENT GOALS:  want to walk with AD well and with confidence and strengthening RUE.  OBJECTIVE:  Note: Objective measures were completed at Evaluation unless otherwise noted.  DIAGNOSTIC FINDINGS:   COGNITION: Overall cognitive status: Within functional limits for tasks assessed   SENSATION: Light touch: decreased light touch from lateral malleolus distally.   LOWER EXTREMITY ROM:     Active  Right Eval Left Eval  Hip flexion    Hip extension    Hip abduction    Hip adduction    Hip internal rotation    Hip external rotation    Knee flexion    Knee extension    Ankle dorsiflexion    Ankle plantarflexion    Ankle inversion    Ankle eversion     (Blank rows = not tested)  LOWER EXTREMITY MMT:    MMT Right Eval Left Eval  Hip flexion    Hip extension    Hip abduction    Hip adduction    Hip internal rotation    Hip external rotation    Knee flexion    Knee extension 4- 4+  Ankle dorsiflexion    Ankle plantarflexion    Ankle inversion    Ankle eversion    (Blank rows = not tested)   TRANSFERS: Sit to stand: Modified independence  Assistive device utilized: Environmental consultant - 2 wheeled     Stand to sit: Modified independence  Assistive device utilized: Environmental consultant - 2 wheeled     Chair to chair: SBA  Assistive device utilized: Environmental consultant - 2 wheeled       GAIT: Findings: Gait Characteristics: step through pattern, decreased arm swing- Right, decreased arm swing- Left, decreased step length- Right, decreased step length- Left, decreased hip/knee flexion- Right, decreased hip/knee flexion- Left, Right foot flat, Left foot flat, and narrow BOS, Distance walked: 83ft, Assistive device utilized:Walker - 2 wheeled, Level of assistance: Modified independence, and Comments:    FUNCTIONAL TESTS:  TUG: 25.71 seconds  DGI: 05/13/24: 8 / 24  L SLS: <0.1 seconds  R SLS: <0.1  seconds  Norms: 18-39  F: 43.5 seconds  M: 43.2 seconds 40-49  F: 40.4 seconds  M: 40.1 seconds 50-59  F: 36 seconds  M: 38.1 seconds 60-69  F: 25.1 seconds  M: 28.7 seconds 70-79  F: 11.3 seconds  M: 18.3 seconds  PATIENT SURVEYS:  ABC Scale:950 / 1600 = 59.4 %                                                                                                                              TREATMENT DATE:  05/21/2024  Therapeutic Exercise: -Step up and overs, 1 set of 5 reps bilaterally, pt requires BUE support -Lateral step up and overs, 1 set of 5 reps, bilaterally, pt requires BUE support -Standing 3 way hip 2 sets 7 reps, bilaterally, pt cued for upright trunk and maintaining of neutral spine -Forward lunges on bosu ball, BUE support, 1 set of 5 reps better performance going into LLE, pt cued for core activation and upright posture -Walking marches/butt kicks, 3 laps, 20 foot line, 3lb ankle weights   Therapeutic Activity: -Sit to stands with shoulder flexion with 5lb bar, 2 sets of 8 reps, pt cued for core activation -Step up and eccentric lowers on bosu ball, 1 set of 4 reps bilaterally, pt requires BUE support   05/19/24: BP 130/66 mmHg HR 83 bpm  STS 10x no HHA Toe tapping 6in step 2x 10 alternating 2 HHA 1st set, 1 HHA Sidestep with RTB around thigh 3RT Tandem stance with 1 HHA 2x 30 Tandem gait 2RT inside // bars Hamstring curls 10x each SLS with 1 heavy UE support x 30 (reports of LBP following this exercise)  Supine: Decompression 5x 5  05/13/24: Reviewed goals Educated importance of HEP which was established this session STS 5x no HHA  DGI 1. Gait level surface (1) Moderate Impairment: Walks 20', slow speed, abnormal gait pattern, evidence for imbalance. 2. Change in gait speed (1) Moderate Impairment: Makes only minor adjustments to walking speed, or accomplishes a change in speed with significant gait deviations, or changes speed but has  significant gait deviations, or changes speed but loses balance but is able to recover and continue walking. 3. Gait with horizontal head turns (1) Moderate Impairment: Performs head turns with moderate change in gait velocity, slows down, staggers but recovers, can continue to walk. 4. Gait with vertical head turns 1) Moderate Impairment: Performs head turns with moderate change in gait velocity, slows down, staggers but recovers, can continue to walk. 5. Gait and pivot turn (1) Moderate Impairment: Turns slowly, requires verbal cueing, requires several small steps to catch balance following turn and stop. 6. Step over obstacle (1) Moderate Impairment: Is able to step over box but must stop, then step over. May require verbal cueing. 7. Step around obstacles (1) Moderate Impairment: Is able to clear cones but must significantly slow, speed to accomplish task, or requires verbal cueing. 8. Stairs (1) Moderate Impairment: Two feet to a stair, must  use rail.  TOTAL SCORE: 8 / 24  Toe tapping on 6in step 10x alternating Abduction 10x each Sidestep 3RT inside // bars (2RT with RTB around thigh)  BP: 129/80 mmHg HR 74 bpm    PATIENT EDUCATION: Education details: PT Evaluation, findings, prognosis, frequency, attendance policy. Person educated: Patient Education method: Explanation and Demonstration Education comprehension: verbalized understanding  HOME EXERCISE PROGRAM: Access Code: [email or web address] URL: [email or web address] Date: 05/14/2024 Prepared by: Augustin Mclean  Exercises - Sit to Stand  - 1 x daily - 7 x weekly - 3 sets - 10 reps - Standing March with Counter Support  - 1 x daily - 7 x weekly - 3 sets - 10 reps - Standing Hip Abduction with Counter Support  - 1 x daily - 7 x weekly - 3 sets - 10 reps - Side Stepping with Resistance at Thighs and Counter Support  - 1 x daily - 7 x weekly - 3 sets - 10 reps  GOALS: Goals reviewed with patient? No  SHORT TERM  GOALS: Target date: 05/25/2024  Pt will be independent with HEP in order to demonstrate participation in Physical Therapy POC.  Baseline: Goal status: INITIAL  2.  Pt will improve ABC score by 15% in order to demonstrate improved pain with functional goals and outcomes. Baseline:  Goal status: INITIAL  LONG TERM GOALS: Target date: 06/22/2024  Pt will increase DGI score by > MCID in order to demonstrate improved functional safety and balance skills in ADL/mobility.   Baseline: see objective.  Goal status: INITIAL  2.  Pt will improve TUG by least 3 seconds in order to demonstrate improved functional mobility capacity in community setting.  Baseline: see objective.  Goal status: INITIAL  3.  Pt will improve ABC score by 30% in order to demonstrate improved pain with functional goals and outcomes. Baseline: see objective.  Goal status: INITIAL  4.  Pt will improve single leg balance by at least 3 seconds bilaterally in order to demonstrate improved capacity, safety, and overall quality of movement to optimize function.  Baseline: see objective.  Goal status: INITIAL  ASSESSMENT:  CLINICAL IMPRESSION: Patient continues to demonstrate decreased RLE strength, decreased gait quality and balance. Patient also demonstrates need for 3 rest breaks between exercises during today's session. Patient able to progress dynamic balance and core activation exercises today with resisted walking and step up variations, good performance with verbal cueing. Patient would continue to benefit from skilled physical therapy for increased endurance with ambulation, increased LE strength, and improved balance for improved quality of life, improved independence with gait training and continued progress towards therapy goals.   Eval:  Patient is a 68y.o. female who was seen today for physical therapy evaluation and treatment for 'compression of spinal cord.'  Patient with history of myelopathy C5 spinal fracture,  with A C4 through C7 cervical fusion.  Patient presents with significant functional mobility deficits, safety concerns, increased falls risk, reduced ambulation capacity at due to muscle weakness, decreased activity tolerance, balance deficits in setting of previous cervical myelopathy pt will benefit from skilled Physical Therapy services to address deficits/limitations in order to improve functional and QOL.    OBJECTIVE IMPAIRMENTS: Abnormal gait, decreased activity tolerance, decreased balance, decreased mobility, difficulty walking, decreased strength, and postural dysfunction.   ACTIVITY LIMITATIONS: carrying, lifting, bending, standing, squatting, stairs, transfers, bed mobility, and locomotion level  PARTICIPATION LIMITATIONS: meal prep, cleaning, laundry, community activity, occupation, and yard work  PERSONAL FACTORS: Age are also affecting patient's functional  outcome.   REHAB POTENTIAL: Excellent  CLINICAL DECISION MAKING: Stable/uncomplicated  EVALUATION COMPLEXITY: Low  PLAN:  PT FREQUENCY: 2x/week  PT DURATION: 8 weeks  PLANNED INTERVENTIONS: 97164- PT Re-evaluation, 97750- Physical Performance Testing, 97110-Therapeutic exercises, 97530- Therapeutic activity, [id number]- Neuromuscular re-education, 97535- Self Care, 02859- Manual therapy, [phone]- Gait training, [phone]- Orthotic Initial, Patient/Family education, Balance training, Stair training, Taping, Joint mobilization, Joint manipulation, Spinal manipulation, Spinal mobilization, Cryotherapy, and Moist heat  PLAN FOR NEXT SESSION: Functional mobility, strengthening, ambulation, etc. Educated donning compression garments.  Lang Ada, PT, DPT [facility] Office: [phone] 10:13 AM, 05/21/24

## 2024-05-22 ENCOUNTER — Encounter (HOSPITAL_COMMUNITY): Payer: Self-pay | Admitting: Occupational Therapy

## 2024-05-22 ENCOUNTER — Ambulatory Visit (HOSPITAL_COMMUNITY): Attending: Surgery | Admitting: Occupational Therapy

## 2024-05-22 DIAGNOSIS — M6281 Muscle weakness (generalized): Secondary | ICD-10-CM | POA: Insufficient documentation

## 2024-05-22 DIAGNOSIS — R29818 Other symptoms and signs involving the nervous system: Secondary | ICD-10-CM | POA: Diagnosis present

## 2024-05-22 DIAGNOSIS — R278 Other lack of coordination: Secondary | ICD-10-CM | POA: Insufficient documentation

## 2024-05-22 DIAGNOSIS — Z7409 Other reduced mobility: Secondary | ICD-10-CM | POA: Diagnosis present

## 2024-05-22 DIAGNOSIS — G9520 Unspecified cord compression: Secondary | ICD-10-CM | POA: Diagnosis present

## 2024-05-22 NOTE — Therapy (Signed)
 OUTPATIENT OCCUPATIONAL THERAPY NEURO TREATMENT NOTE  Patient Name: Angela Norman MRN: [id number] DOB:September 19, 1956, 68 y.o., female Today's Date: 05/22/2024  PCP: Shona Norleen PEDLAR, MD REFERRING PROVIDER: Hillman Amour, MD  END OF SESSION:  OT End of Session - 05/22/24 1200     Visit Number 2    Number of Visits 9    Date for OT Re-Evaluation 06/19/24    Authorization Type Medicare Part A and B    OT Start Time 0941    OT Stop Time 1024    OT Time Calculation (min) 43 min    Activity Tolerance Patient tolerated treatment well    Behavior During Therapy WFL for tasks assessed/performed           Past Medical History:  Diagnosis Date   Anxiety    Arthritis    Basal cell carcinoma    Charcot's joint of foot    bilateral   Diabetes mellitus without complication (HCC)    Fibromyalgia    Neuropathy    Past Surgical History:  Procedure Laterality Date   ACHILLES TENDON REPAIR Left    bone spur Left    --left foot   BREAST SURGERY     benign breast bx--left--fatty tissue   CARPAL TUNNEL RELEASE Bilateral    COLONOSCOPY     COLONOSCOPY WITH PROPOFOL  N/A 09/06/2022   Procedure: COLONOSCOPY WITH PROPOFOL ;  Surgeon: Shaaron Lamar HERO, MD;  [facility];  Service: Endoscopy;  Laterality: N/A;  10:30am, asa 2   DILATION AND CURETTAGE OF UTERUS  2011   for cervical polyp   FOOT SURGERY Left 03/2018   reconstructive surgery   POLYPECTOMY  09/06/2022   Procedure: POLYPECTOMY;  Surgeon: Shaaron Lamar HERO, MD;  [facility]: [facility];  Service: Endoscopy;;   Patient Active Problem List   Diagnosis Date Noted   Anxiety 06/28/2022   Arthritis 06/28/2022   Body fluid retention 06/28/2022   Hyperglycemia due to type 2 diabetes mellitus (HCC) 06/28/2022   Mixed hyperlipidemia 06/28/2022   Fibromyalgia 06/28/2022   Cough 10/25/2021   Musculoskeletal pain 05/21/2012   Stiffness of joint, not elsewhere classified, ankle and foot 08/13/2011   Difficulty walking  08/13/2011   Ankle weakness 08/13/2011    ONSET DATE: 08/2023  REFERRING DIAG:  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 Pt had multiple bouts of acute and inpatient rehab stays due to complications. Significant BP issues throughout this time. Had stay in multiple facilities. Pt reported various changes in functional status, was requiring assist for ADLs and mobility since April 28th. Since then, pt has been home with Home Health services. Pt continues to report numbness in BUE and poor grip strength. Pt reports neuropathy in BLE. Prior to onset of November '24 was walking daily for 45-60 minutes.   PRECAUTIONS: Fall  WEIGHT BEARING RESTRICTIONS: No  PAIN:  Are you having pain? No  FALLS: Has patient fallen in last 6 months? No  LIVING ENVIRONMENT: Lives with: lives with their spouse Lives in: House/apartment Stairs: Pt lives in the basement with no stairs Has following equipment at home: Vannie - 2 wheeled  and shower chair  PLOF: Independent  PATIENT GOALS: To improve mobility and strengthening  OBJECTIVE:  Note: Objective measures were completed at Evaluation unless otherwise noted.  HAND DOMINANCE: Right  ADLs: Overall ADLs: Pt has difficulty with all small object manipulation, including but not limited to buttons, shoe laces, and zippers. Additionally she has max difficulty with cooking and cleaning due to weakness in both hands and wrists.   MOBILITY STATUS: Hx of falls, difficulty with turns, and difficulty carrying objections with ambulation  POSTURE  COMMENTS:  rounded shoulders Sitting balance: Moves/returns truncal midpoint 1-2 inches in multiple planes  ACTIVITY TOLERANCE: Activity tolerance: Fair tolerance - fatigues quicker than normal  FUNCTIONAL OUTCOME MEASURES: Quick Dash: 47.73  UPPER EXTREMITY ROM:    All ROM is WFL  UPPER EXTREMITY MMT:     MMT Right eval Left eval  Shoulder flexion 5/5 5/5  Shoulder abduction 5/5 5/5  Shoulder internal rotation 5/5 5/5  Shoulder external rotation 5/5 5/5  Elbow flexion 5/5 5/5  Elbow extension 5/5 5/5  Wrist flexion 4/5 4+/5  Wrist extension 4-/5 4/5  Wrist ulnar deviation 4-/5 4/5  Wrist radial deviation 4-/5 4-/5  Wrist pronation 4/5 4+/5  Wrist supination 4/5 4/5  (Blank rows = not tested)  HAND FUNCTION: Grip strength: Right: 20 lbs; Left: 27 lbs, Lateral pinch: Right: 5 lbs, Left: 9 lbs, and 3 point pinch: Right: 5 lbs, Left: 4 lbs  COORDINATION: 9 Hole Peg test: Right: -- sec; Left: -- sec  SENSATION: Pt has full numbness along median nerve of R hand as well as dulled sensation and tingling in BUE up to elbows.   EDEMA: No swelling noted  OBSERVATIONS: adducted thumbs and 80% grip                                                                                                                             TREATMENT DATE:   05/22/24 -Shoulder Shrugs x10 -Scapular Retraction x10 -Cervical Stretches: flexion/extension, rotations, tilts, chin tucks, 4x10 -Wrist ROM: 2#, extension, ulnar deviation, radial deviation, flexion, supination/pronation, x10 -Digit ROM: composite flexion, abduction, finger taps, opposition, x10      PATIENT EDUCATION: Education details: Wrist ROM and Digit ROM Person educated: Patient Education method: Programmer, multimedia, Demonstration, and Handouts Education comprehension: verbalized understanding and returned demonstration  HOME EXERCISE PROGRAM: 8/1: Wrist ROM and Digit ROM   GOALS: Goals reviewed with patient? Yes  SHORT  TERM GOALS: Target date: 06/19/24  Pt will be provided and educated on a comprehensive HEP for BUE mobility in order to complete ADL's and IADL's independently.   Goal status: IN PROGRESS  2.  Pt will improve BUE strength to 5/5 in order to lift and carry items during cooking and cleaning tasks.   Goal status: IN PROGRESS  3.  Pt will improve BUE grip by 15# and pinch by 3# in order to grasp and hold pots and pans.  Goal status: IN PROGRESS  4.  Pt will improve BUE coordination by completing 9 hole peg test in 45 or less in order to manipulate and complete fine motor tasks.   Goal status: IN PROGRESS   ASSESSMENT:  CLINICAL IMPRESSION: Pt completing first session of OT treatment. She reports increased numbness in her hands and stiffness along her trapezius and neck. OT initiated scapular ROM and cervical stretches to assist with relieving stiffness and discomfort. Pt then worked on wrist and digit ROM with mod difficulty due to poor motor control. OT providing verbal, visual, and tactile cuing for positioning and technique.   PERFORMANCE DEFICITS: in functional skills including ADLs, IADLs, coordination, dexterity, sensation, ROM, strength, pain, fascial restrictions, Fine motor control, Gross motor control, body mechanics, and UE functional use.    PLAN:  OT FREQUENCY: 2x/week  OT DURATION: 4 weeks  PLANNED INTERVENTIONS: 97168 OT Re-evaluation, 97535 self care/ADL training, 02889 therapeutic exercise, 97530 therapeutic activity, 97112 neuromuscular re-education, 97140 manual therapy, 97035 ultrasound, 97018 paraffin, 02989 moist heat, 97032 electrical stimulation (manual), passive range of motion, functional mobility training, energy conservation, coping strategies training, patient/family education, and DME and/or AE instructions  RECOMMENDED OTHER SERVICES: N/A  CONSULTED AND AGREED WITH PLAN OF CARE: Patient  PLAN FOR NEXT SESSION: Wrist and Digit ROM, strengthening,  coordination tasks   Valentin Nightingale, OTR/L [facility] [phone] Valentin Jillyn Nightingale, OT 05/22/2024, 12:00 PM

## 2024-05-22 NOTE — Patient Instructions (Addendum)
 Strengthening Exercises  1) WRIST EXTENSION CURLS - TABLE  Hold a small free weight, rest your forearm on a table and bend your wrist up and down with your palm face down as shown.      2) WRIST FLEXION CURLS - TABLE  Hold a small free weight, rest your forearm on a table and bend your wrist up and down with your palm face up as shown.     3) FREE WEIGHT RADIAL/ULNAR DEVIATION - TABLE  Hold a small free weight, rest your forearm on a table and bend your wrist up and down with your palm facing towards the side as shown.     4) Pronation  Forearm supported on table with wrist in neutral position. Using a weight, roll wrist so that palm faces downward. Hold for 2 seconds and return to starting position.     5) Supination  Forearm supported on table with wrist in neutral position. Using a weight, roll wrist so that palm is now facing upward. Hold for 2 seconds and return to starting position.      *Complete exercises using 1-2 pound weight, 10 times each, 2-3 times per day*     Complete each exercise 10-15X, 2-3X/day  1) Towel crunch Place a small towel on a firm table top. Flatten out the towel and then place your hand on one end of it.  Next, flex your fingers 2-5 (index finger through pinky finger) as you pull the towel towards your hand.    2) Digit composite flexion/adduction (make a fist) Hold your hand up as shown. Open and close your hand into a fist and repeat. If you cannot make a full fist, then make a partial fist.    3) Thumb/finger opposition Touch the tip of the thumb to each fingertip one by one. Extend fingers fully after they are touched.      4) Finger Taps Start with the hand flat and fingers slightly spread.  One at a time, starting with the thumb, lift each finger up separately.    5) Digit Abduction/Adduction Hold hand palm down flat on table. Spread your fingers apart and back together.

## 2024-05-25 ENCOUNTER — Ambulatory Visit (HOSPITAL_COMMUNITY)

## 2024-05-25 DIAGNOSIS — R278 Other lack of coordination: Secondary | ICD-10-CM

## 2024-05-25 DIAGNOSIS — R29818 Other symptoms and signs involving the nervous system: Secondary | ICD-10-CM

## 2024-05-25 DIAGNOSIS — G9520 Unspecified cord compression: Secondary | ICD-10-CM

## 2024-05-25 NOTE — Therapy (Signed)
 OUTPATIENT PHYSICAL THERAPY NEURO TREATMENT   Patient Name: Angela Norman MRN: [id number] DOB:Sep 13, 1956, 68 y.o., female Today's Date: 05/25/2024   PCP: Sebastian Othel GAILS, FNP REFERRING PROVIDER: Edna Norris, MD  END OF SESSION:  PT End of Session - 05/25/24 1530     Visit Number 5    Number of Visits 16    Date for PT Re-Evaluation 06/22/24    Authorization Type Medicare A & B    PT Start Time 1430    PT Stop Time 1515    PT Time Calculation (min) 45 min    Equipment Utilized During Treatment Gait belt    Activity Tolerance Patient tolerated treatment well    Behavior During Therapy WFL for tasks assessed/performed             Past Medical History:  Diagnosis Date   Anxiety    Arthritis    Basal cell carcinoma    Charcot's joint of foot    bilateral   Diabetes mellitus without complication (HCC)    Fibromyalgia    Neuropathy    Past Surgical History:  Procedure Laterality Date   ACHILLES TENDON REPAIR Left    bone spur Left    --left foot   BREAST SURGERY     benign breast bx--left--fatty tissue   CARPAL TUNNEL RELEASE Bilateral    COLONOSCOPY     COLONOSCOPY WITH PROPOFOL  N/A 09/06/2022   Procedure: COLONOSCOPY WITH PROPOFOL ;  Surgeon: Shaaron Lamar HERO, MD;  [facility]: [facility];  Service: Endoscopy;  Laterality: N/A;  10:30am, asa 2   DILATION AND CURETTAGE OF UTERUS  2011   for cervical polyp   FOOT SURGERY Left 03/2018   reconstructive surgery   POLYPECTOMY  09/06/2022   Procedure: POLYPECTOMY;  Surgeon: Shaaron Lamar HERO, MD;  [facility];  Service: Endoscopy;;   Patient Active Problem List   Diagnosis Date Noted   Anxiety 06/28/2022   Arthritis 06/28/2022   Body fluid retention 06/28/2022   Hyperglycemia due to type 2 diabetes mellitus (HCC) 06/28/2022   Mixed hyperlipidemia 06/28/2022   Fibromyalgia 06/28/2022   Cough 10/25/2021   Musculoskeletal pain 05/21/2012   Stiffness of joint, not elsewhere classified,  ankle and foot 08/13/2011   Difficulty walking 08/13/2011   Ankle weakness 08/13/2011    ONSET DATE: November 2024  REFERRING DIAG: compression of spinal cord   THERAPY DIAG:  Other lack of coordination  Other symptoms and signs involving the nervous system  Compression of spinal cord (HCC)  Rationale for Evaluation and Treatment: Rehabilitation  SUBJECTIVE:  SUBJECTIVE STATEMENT: Pt states R leg feels like theres a weight on it when she's moving around and walking. Has some issues with her L leg balance more than her R (feels like she's leaning that way).   Eval:  Pt reported all this started with UTI that caused fracture f neck and indicated as C4-C7 fusion. Pt had multiple bouts of acute and inpatient rehab stays due to complications. Significant BP issues throughout this time. Had stay in multiple facilities. Pt reported various changes in functional status, was requiring assist for ADLs and mobility since April 28th. Since then, pt has been home with Home Health services. Pt continues to report numbness in BUE and poor grip strength. Pt reports neuropathy in BLE. Prior to onset of November '24 was walking daily for 45-60 minutes.   Pt has CT scan for nodule on Lung on 04/28/24.  Pt accompanied by: self  PERTINENT HISTORY:  Cervical Myelopathy with ACDF 4-7 in November 2024\ Left ankle surgery x 3  PAIN:  Are you having pain? No and chronic pain in left shoulder  PRECAUTIONS: None  RED FLAGS: Bowel or bladder incontinence: Yes: SCI   WEIGHT BEARING RESTRICTIONS: No  FALLS: Has patient fallen in last 6 months? Yes. Number of falls 1-2   PATIENT GOALS:  want to walk with AD well and with confidence and strengthening RUE.  OBJECTIVE:  Note: Objective measures were completed at  Evaluation unless otherwise noted.  DIAGNOSTIC FINDINGS:   COGNITION: Overall cognitive status: Within functional limits for tasks assessed   SENSATION: Light touch: decreased light touch from lateral malleolus distally.   LOWER EXTREMITY ROM:     Active  Right Eval Left Eval  Hip flexion    Hip extension    Hip abduction    Hip adduction    Hip internal rotation    Hip external rotation    Knee flexion    Knee extension    Ankle dorsiflexion    Ankle plantarflexion    Ankle inversion    Ankle eversion     (Blank rows = not tested)  LOWER EXTREMITY MMT:    MMT Right Eval Left Eval  Hip flexion    Hip extension    Hip abduction    Hip adduction    Hip internal rotation    Hip external rotation    Knee flexion    Knee extension 4- 4+  Ankle dorsiflexion    Ankle plantarflexion    Ankle inversion    Ankle eversion    (Blank rows = not tested)   TRANSFERS: Sit to stand: Modified independence  Assistive device utilized: Environmental consultant - 2 wheeled     Stand to sit: Modified independence  Assistive device utilized: Environmental consultant - 2 wheeled     Chair to chair: SBA  Assistive device utilized: Environmental consultant - 2 wheeled       GAIT: Findings: Gait Characteristics: step through pattern, decreased arm swing- Right, decreased arm swing- Left, decreased step length- Right, decreased step length- Left, decreased hip/knee flexion- Right, decreased hip/knee flexion- Left, Right foot flat, Left foot flat, and narrow BOS, Distance walked: 77ft, Assistive device utilized:Walker - 2 wheeled, Level of assistance: Modified independence, and Comments:    FUNCTIONAL TESTS:  TUG: 25.71 seconds  DGI: 05/13/24: 8 / 24  L SLS: <0.1 seconds  R SLS: <0.1 seconds  Norms: 18-39  F: 43.5 seconds  M: 43.2 seconds 40-49  F: 40.4 seconds  M: 40.1 seconds 50-59  F: 36 seconds  M: 38.1 seconds 60-69  F: 25.1 seconds  M: 28.7 seconds 70-79  F: 11.3 seconds  M: 18.3 seconds  PATIENT SURVEYS:  ABC  Scale:950 / 1600 = 59.4 %                                                                                                                              TREATMENT DATE:  05/25/24 Therapeutic Exercise: -Standing hip abd and ext; 2 sets 10 reps w/ 3# , bilaterally, pt cued for upright trunk and maintaining of neutral spine  Therapeutic Activity: -Sit to stands from mat table - 10 reps without UE A; pt cued for core activation - Sit to stand + step fwd/bwd onto airex without UE A 3 out of 10 reps (single UE A needed for 7 reps) - Weighted side stepping without UE A and with cues for maintaining  -Step up and back, 2 set of 5 reps bilaterally, pt requires BUE support -Walking marches/butt kicks, 3 laps, 20 foot line, 3lb ankle weights -Lateral step up and overs, 1 set of 5 reps, bilaterally, pt requires BUE support  05/21/2024  Therapeutic Exercise: -Step up and overs, 1 set of 5 reps bilaterally, pt requires BUE support -Lateral step up and overs, 1 set of 5 reps, bilaterally, pt requires BUE support -Standing 3 way hip 2 sets 7 reps, bilaterally, pt cued for upright trunk and maintaining of neutral spine -Forward lunges on bosu ball, BUE support, 1 set of 5 reps better performance going into LLE, pt cued for core activation and upright posture -Walking marches/butt kicks, 3 laps, 20 foot line, 3lb ankle weights   Therapeutic Activity: -Sit to stands with shoulder flexion with 5lb bar, 2 sets of 8 reps, pt cued for core activation -Step up and eccentric lowers on bosu ball, 1 set of 4 reps bilaterally, pt requires BUE support   05/19/24: BP 130/66 mmHg HR 83 bpm  STS 10x no HHA Toe tapping 6in step 2x 10 alternating 2 HHA 1st set, 1 HHA Sidestep with RTB around thigh 3RT Tandem stance with 1 HHA 2x 30 Tandem gait 2RT inside // bars Hamstring curls 10x each SLS with 1 heavy UE support x 30 (reports of LBP following this exercise)  Supine: Decompression 5x 5  PATIENT  EDUCATION: Education details: PT Evaluation, findings, prognosis, frequency, attendance policy. Person educated: Patient Education method: Explanation and Demonstration Education comprehension: verbalized understanding  HOME EXERCISE PROGRAM: Access Code: [email or web address] URL: [email or web address] Date: 05/14/2024 Prepared by: Augustin Mclean  Exercises - Sit to Stand  - 1 x daily - 7 x weekly - 3 sets - 10 reps - Standing March with Counter Support  - 1 x daily - 7 x weekly - 3 sets - 10 reps - Standing Hip Abduction with Counter Support  - 1 x daily - 7 x weekly - 3 sets - 10 reps - Side Stepping with Resistance at Thighs and Counter Support  - 1  x daily - 7 x weekly - 3 sets - 10 reps  GOALS: Goals reviewed with patient? No  SHORT TERM GOALS: Target date: 05/25/2024  Pt will be independent with HEP in order to demonstrate participation in Physical Therapy POC.  Baseline: Goal status: INITIAL  2.  Pt will improve ABC score by 15% in order to demonstrate improved pain with functional goals and outcomes. Baseline:  Goal status: INITIAL  LONG TERM GOALS: Target date: 06/22/2024  Pt will increase DGI score by > MCID in order to demonstrate improved functional safety and balance skills in ADL/mobility.   Baseline: see objective.  Goal status: INITIAL  2.  Pt will improve TUG by least 3 seconds in order to demonstrate improved functional mobility capacity in community setting.  Baseline: see objective.  Goal status: INITIAL  3.  Pt will improve ABC score by 30% in order to demonstrate improved pain with functional goals and outcomes. Baseline: see objective.  Goal status: INITIAL  4.  Pt will improve single leg balance by at least 3 seconds bilaterally in order to demonstrate improved capacity, safety, and overall quality of movement to optimize function.  Baseline: see objective.  Goal status: INITIAL  ASSESSMENT:  CLINICAL IMPRESSION: Patient participated well  with PT interventions addressing decreased strength in BLE, decreased balance, and decreased endurance in BLE with c/o posterior R LE tightness/heaviness and noted imbalances posteriorly and to the L with side stepping interventions. Decreased confidence in balance and upon standing from sitting noted with sit to stand. Patient would continue to benefit from skilled physical therapy for increased endurance with ambulation, increased LE strength, and improved balance for improved quality of life, improved independence with gait training and continued progress towards therapy goals.   Eval:  Patient is a 68y.o. female who was seen today for physical therapy evaluation and treatment for 'compression of spinal cord.'  Patient with history of myelopathy C5 spinal fracture, with A C4 through C7 cervical fusion.  Patient presents with significant functional mobility deficits, safety concerns, increased falls risk, reduced ambulation capacity at due to muscle weakness, decreased activity tolerance, balance deficits in setting of previous cervical myelopathy pt will benefit from skilled Physical Therapy services to address deficits/limitations in order to improve functional and QOL.    OBJECTIVE IMPAIRMENTS: Abnormal gait, decreased activity tolerance, decreased balance, decreased mobility, difficulty walking, decreased strength, and postural dysfunction.   ACTIVITY LIMITATIONS: carrying, lifting, bending, standing, squatting, stairs, transfers, bed mobility, and locomotion level  PARTICIPATION LIMITATIONS: meal prep, cleaning, laundry, community activity, occupation, and yard work  PERSONAL FACTORS: Age are also affecting patient's functional outcome.   REHAB POTENTIAL: Excellent  CLINICAL DECISION MAKING: Stable/uncomplicated  EVALUATION COMPLEXITY: Low  PLAN:  PT FREQUENCY: 2x/week  PT DURATION: 8 weeks  PLANNED INTERVENTIONS: 97164- PT Re-evaluation, 97750- Physical Performance Testing,  97110-Therapeutic exercises, 97530- Therapeutic activity, [id number]- Neuromuscular re-education, 97535- Self Care, 02859- Manual therapy, [phone]- Gait training, [phone]- Orthotic Initial, Patient/Family education, Balance training, Stair training, Taping, Joint mobilization, Joint manipulation, Spinal manipulation, Spinal mobilization, Cryotherapy, and Moist heat  PLAN FOR NEXT SESSION: Functional mobility, strengthening, ambulation, etc. Educated donning compression garments.  Lamarr LITTIE Citrin PT, DPT [facility] [phone] office  3:32 PM, 05/25/24

## 2024-05-26 ENCOUNTER — Encounter (HOSPITAL_COMMUNITY): Payer: Self-pay | Admitting: Occupational Therapy

## 2024-05-26 ENCOUNTER — Ambulatory Visit (HOSPITAL_COMMUNITY): Admitting: Occupational Therapy

## 2024-05-26 DIAGNOSIS — R29818 Other symptoms and signs involving the nervous system: Secondary | ICD-10-CM

## 2024-05-26 DIAGNOSIS — R278 Other lack of coordination: Secondary | ICD-10-CM | POA: Diagnosis not present

## 2024-05-26 NOTE — Therapy (Signed)
 OUTPATIENT OCCUPATIONAL THERAPY NEURO TREATMENT NOTE  Patient Name: Angela Norman MRN: [id number] DOB:19-Jan-1956, 68 y.o., female Today's Date: 05/26/2024  PCP: Shona Norleen PEDLAR, MD REFERRING PROVIDER: Hillman Amour, MD  END OF SESSION:  OT End of Session - 05/26/24 1315     Visit Number 3    Number of Visits 9    Date for OT Re-Evaluation 06/19/24    Authorization Type Medicare Part A and B    OT Start Time 1106    OT Stop Time 1145    OT Time Calculation (min) 39 min    Activity Tolerance Patient tolerated treatment well    Behavior During Therapy WFL for tasks assessed/performed            Past Medical History:  Diagnosis Date   Anxiety    Arthritis    Basal cell carcinoma    Charcot's joint of foot    bilateral   Diabetes mellitus without complication (HCC)    Fibromyalgia    Neuropathy    Past Surgical History:  Procedure Laterality Date   ACHILLES TENDON REPAIR Left    bone spur Left    --left foot   BREAST SURGERY     benign breast bx--left--fatty tissue   CARPAL TUNNEL RELEASE Bilateral    COLONOSCOPY     COLONOSCOPY WITH PROPOFOL  N/A 09/06/2022   Procedure: COLONOSCOPY WITH PROPOFOL ;  Surgeon: Shaaron Lamar HERO, MD;  Location: [facility];  Service: Endoscopy;  Laterality: N/A;  10:30am, asa 2   DILATION AND CURETTAGE OF UTERUS  2011   for cervical polyp   FOOT SURGERY Left 03/2018   reconstructive surgery   POLYPECTOMY  09/06/2022   Procedure: POLYPECTOMY;  Surgeon: Shaaron Lamar HERO, MD;  Location: [facility];  Service: Endoscopy;;   Patient Active Problem List   Diagnosis Date Noted   Anxiety 06/28/2022   Arthritis 06/28/2022   Body fluid retention 06/28/2022   Hyperglycemia due to type 2 diabetes mellitus (HCC) 06/28/2022   Mixed hyperlipidemia 06/28/2022   Fibromyalgia 06/28/2022   Cough 10/25/2021   Musculoskeletal pain 05/21/2012   Stiffness of joint, not elsewhere classified, ankle and foot 08/13/2011   Difficulty walking  08/13/2011   Ankle weakness 08/13/2011    ONSET DATE: 08/2023  REFERRING DIAG:  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 Pt had multiple bouts of acute and inpatient rehab stays due to complications. Significant BP issues throughout this time. Had stay in multiple facilities. Pt reported various changes in functional status, was requiring assist for ADLs and mobility since April 28th. Since then, pt has been home with Home Health services. Pt continues to report numbness in BUE and poor grip strength. Pt reports neuropathy in BLE. Prior to onset of November '24 was walking daily for 45-60 minutes.   PRECAUTIONS: Fall  WEIGHT BEARING RESTRICTIONS: No  PAIN:  Are you having pain? Yes: NPRS scale: 2/10 Pain location: left arm Pain description: numbness/tingling Aggravating factors: rainy days Relieving factors: medication  FALLS: Has patient fallen in last 6 months? No  LIVING ENVIRONMENT: Lives with: lives with their spouse Lives in: House/apartment Stairs: Pt lives in  the basement with no stairs Has following equipment at home: Walker - 2 wheeled and shower chair  PLOF: Independent  PATIENT GOALS: To improve mobility and strengthening  OBJECTIVE:  Note: Objective measures were completed at Evaluation unless otherwise noted.  HAND DOMINANCE: Right  ADLs: Overall ADLs: Pt has difficulty with all small object manipulation, including but not limited to buttons, shoe laces, and zippers. Additionally she has max difficulty with cooking and cleaning due to weakness in both hands and wrists.    MOBILITY STATUS: Hx of falls, difficulty with turns, and difficulty carrying objections with ambulation  POSTURE COMMENTS:  rounded shoulders Sitting balance: Moves/returns truncal midpoint 1-2 inches in multiple planes  ACTIVITY TOLERANCE: Activity tolerance: Fair tolerance - fatigues quicker than normal  FUNCTIONAL OUTCOME MEASURES: Quick Dash: 47.73  UPPER EXTREMITY ROM:    All ROM is WFL  UPPER EXTREMITY MMT:     MMT Right eval Left eval  Shoulder flexion 5/5 5/5  Shoulder abduction 5/5 5/5  Shoulder internal rotation 5/5 5/5  Shoulder external rotation 5/5 5/5  Elbow flexion 5/5 5/5  Elbow extension 5/5 5/5  Wrist flexion 4/5 4+/5  Wrist extension 4-/5 4/5  Wrist ulnar deviation 4-/5 4/5  Wrist radial deviation 4-/5 4-/5  Wrist pronation 4/5 4+/5  Wrist supination 4/5 4/5  (Blank rows = not tested)  HAND FUNCTION: Grip strength: Right: 20 lbs; Left: 27 lbs, Lateral pinch: Right: 5 lbs, Left: 9 lbs, and 3 point pinch: Right: 5 lbs, Left: 4 lbs  COORDINATION: 9 Hole Peg test: Right: -- sec; Left: -- sec  SENSATION: Pt has full numbness along median nerve of R hand as well as dulled sensation and tingling in BUE up to elbows.   EDEMA: No swelling noted  OBSERVATIONS: adducted thumbs and 80% grip                                                                                                                             TREATMENT DATE:  05/26/24 -Scapular retraction, 10 reps -Shoulder A/ROM: protraction, flexion, abduction, horizontal abduction, 10 reps -Proximal shoulder strengthening: sitting-paddles, criss cross, circles each direction, 10 reps -ABC writing, BUE, shoulders at 90 degrees flexion -Red theraputty: BUE-flatten, using pvc pipe to cut circles into putty, rolling, gripping, 3 point pinch -Sponges: right hand-16, 13; left hand-18, 19  05/22/24 -Shoulder Shrugs x10 -Scapular Retraction x10 -Cervical Stretches: flexion/extension, rotations,  tilts, chin tucks, 4x10 -Wrist ROM: 2#, extension, ulnar deviation, radial deviation, flexion, supination/pronation, x10 -Digit ROM: composite flexion, abduction, finger taps, opposition, x10      PATIENT EDUCATION: Education details: reviewed HEP Person educated: Patient Education method: Programmer, multimedia, Demonstration, and Handouts Education comprehension: verbalized understanding and returned demonstration  HOME EXERCISE PROGRAM: 8/1: Wrist ROM and Digit ROM   GOALS: Goals reviewed with patient? Yes  SHORT TERM GOALS: Target date: 06/19/24  Pt will be provided and educated on a comprehensive HEP for BUE mobility in order to complete ADL's and  IADL's independently.   Goal status: IN PROGRESS  2.  Pt will improve BUE strength to 5/5 in order to lift and carry items during cooking and cleaning tasks.   Goal status: IN PROGRESS  3.  Pt will improve BUE grip by 15# and pinch by 3# in order to grasp and hold pots and pans.  Goal status: IN PROGRESS  4.  Pt will improve BUE coordination by completing 9 hole peg test in 45 or less in order to manipulate and complete fine motor tasks.   Goal status: IN PROGRESS   ASSESSMENT:  CLINICAL IMPRESSION: Pt reporting digit abduction/adduction is difficult for her to complete when working on her HEP. Initiated BUE shoulder A/ROM/strengthening, pt with ROM WFL and reports mod fatigue at end of session. Initiated grip/pinch strengthening with red theraputty, pt with intermitting mild discomfort in MC joints of thumbs due to arthritis. Verbal cuing for form and technique, visual demo for novel exercises.     PERFORMANCE DEFICITS: in functional skills including ADLs, IADLs, coordination, dexterity, sensation, ROM, strength, pain, fascial restrictions, Fine motor control, Gross motor control, body mechanics, and UE functional use.    PLAN:  OT FREQUENCY: 2x/week  OT DURATION: 4 weeks  PLANNED INTERVENTIONS: 97168 OT Re-evaluation,  97535 self care/ADL training, 02889 therapeutic exercise, 97530 therapeutic activity, 97112 neuromuscular re-education, 97140 manual therapy, 97035 ultrasound, 97018 paraffin, 02989 moist heat, 97032 electrical stimulation (manual), passive range of motion, functional mobility training, energy conservation, coping strategies training, patient/family education, and DME and/or AE instructions  CONSULTED AND AGREED WITH PLAN OF CARE: Patient  PLAN FOR NEXT SESSION: Wrist and Digit ROM, strengthening, coordination tasks   Sonny Cory, OTR/L  [phone] 05/26/2024, 1:16 PM

## 2024-05-27 ENCOUNTER — Ambulatory Visit (HOSPITAL_COMMUNITY)

## 2024-05-27 ENCOUNTER — Encounter (HOSPITAL_COMMUNITY): Payer: Self-pay

## 2024-05-27 DIAGNOSIS — G9520 Unspecified cord compression: Secondary | ICD-10-CM

## 2024-05-27 DIAGNOSIS — R278 Other lack of coordination: Secondary | ICD-10-CM | POA: Diagnosis not present

## 2024-05-27 DIAGNOSIS — Z7409 Other reduced mobility: Secondary | ICD-10-CM

## 2024-05-27 DIAGNOSIS — M6281 Muscle weakness (generalized): Secondary | ICD-10-CM

## 2024-05-27 NOTE — Therapy (Signed)
 OUTPATIENT PHYSICAL THERAPY NEURO TREATMENT   Patient Name: Angela Norman MRN: [id number] DOB:15-Dec-1955, 68 y.o., female Today's Date: 05/27/2024   PCP: Sebastian Othel GAILS, FNP REFERRING PROVIDER: Edna Norris, MD  END OF SESSION:  PT End of Session - 05/27/24 1431     Visit Number 6    Number of Visits 16    Date for PT Re-Evaluation 06/22/24    Authorization Type Medicare A & B    PT Start Time 1433    PT Stop Time 1513    PT Time Calculation (min) 40 min    Equipment Utilized During Treatment Gait belt    Activity Tolerance Patient tolerated treatment well    Behavior During Therapy WFL for tasks assessed/performed             Past Medical History:  Diagnosis Date   Anxiety    Arthritis    Basal cell carcinoma    Charcot's joint of foot    bilateral   Diabetes mellitus without complication (HCC)    Fibromyalgia    Neuropathy    Past Surgical History:  Procedure Laterality Date   ACHILLES TENDON REPAIR Left    bone spur Left    --left foot   BREAST SURGERY     benign breast bx--left--fatty tissue   CARPAL TUNNEL RELEASE Bilateral    COLONOSCOPY     COLONOSCOPY WITH PROPOFOL  N/A 09/06/2022   Procedure: COLONOSCOPY WITH PROPOFOL ;  Surgeon: Shaaron Lamar HERO, MD;  Location: [facility];  Service: Endoscopy;  Laterality: N/A;  10:30am, asa 2   DILATION AND CURETTAGE OF UTERUS  2011   for cervical polyp   FOOT SURGERY Left 03/2018   reconstructive surgery   POLYPECTOMY  09/06/2022   Procedure: POLYPECTOMY;  Surgeon: Shaaron Lamar HERO, MD;  Location: [facility];  Service: Endoscopy;;   Patient Active Problem List   Diagnosis Date Noted   Anxiety 06/28/2022   Arthritis 06/28/2022   Body fluid retention 06/28/2022   Hyperglycemia due to type 2 diabetes mellitus (HCC) 06/28/2022   Mixed hyperlipidemia 06/28/2022   Fibromyalgia 06/28/2022   Cough 10/25/2021   Musculoskeletal pain 05/21/2012   Stiffness of joint, not elsewhere classified,  ankle and foot 08/13/2011   Difficulty walking 08/13/2011   Ankle weakness 08/13/2011    ONSET DATE: November 2024  REFERRING DIAG: compression of spinal cord   THERAPY DIAG:  Compression of spinal cord (HCC)  Impaired functional mobility, balance, gait, and endurance  Muscle weakness (generalized)  Rationale for Evaluation and Treatment: Rehabilitation  SUBJECTIVE:  SUBJECTIVE STATEMENT: Reports increased numbness feet and hands, wondering if the weather is a factor  (rainy outside).  No reports of pain or recent fall, doing her exercises daily.  Increased difficulty getting Rt LE into car.    Eval:  Pt reported all this started with UTI that caused fracture f neck and indicated as C4-C7 fusion. Pt had multiple bouts of acute and inpatient rehab stays due to complications. Significant BP issues throughout this time. Had stay in multiple facilities. Pt reported various changes in functional status, was requiring assist for ADLs and mobility since April 28th. Since then, pt has been home with Home Health services. Pt continues to report numbness in BUE and poor grip strength. Pt reports neuropathy in BLE. Prior to onset of November '24 was walking daily for 45-60 minutes.   Pt has CT scan for nodule on Lung on 04/28/24.  Pt accompanied by: self  PERTINENT HISTORY:  Cervical Myelopathy with ACDF 4-7 in November 2024\ Left ankle surgery x 3  PAIN:  Are you having pain? No and chronic pain in left shoulder  PRECAUTIONS: None  RED FLAGS: Bowel or bladder incontinence: Yes: SCI   WEIGHT BEARING RESTRICTIONS: No  FALLS: Has patient fallen in last 6 months? Yes. Number of falls 1-2   PATIENT GOALS:  want to walk with AD well and with confidence and strengthening RUE.  OBJECTIVE:  Note:  Objective measures were completed at Evaluation unless otherwise noted.  DIAGNOSTIC FINDINGS:   COGNITION: Overall cognitive status: Within functional limits for tasks assessed   SENSATION: Light touch: decreased light touch from lateral malleolus distally.   LOWER EXTREMITY ROM:     Active  Right Eval Left Eval  Hip flexion    Hip extension    Hip abduction    Hip adduction    Hip internal rotation    Hip external rotation    Knee flexion    Knee extension    Ankle dorsiflexion    Ankle plantarflexion    Ankle inversion    Ankle eversion     (Blank rows = not tested)  LOWER EXTREMITY MMT:    MMT Right Eval Left Eval  Hip flexion    Hip extension    Hip abduction    Hip adduction    Hip internal rotation    Hip external rotation    Knee flexion    Knee extension 4- 4+  Ankle dorsiflexion    Ankle plantarflexion    Ankle inversion    Ankle eversion    (Blank rows = not tested)   TRANSFERS: Sit to stand: Modified independence  Assistive device utilized: Environmental consultant - 2 wheeled     Stand to sit: Modified independence  Assistive device utilized: Environmental consultant - 2 wheeled     Chair to chair: SBA  Assistive device utilized: Environmental consultant - 2 wheeled       GAIT: Findings: Gait Characteristics: step through pattern, decreased arm swing- Right, decreased arm swing- Left, decreased step length- Right, decreased step length- Left, decreased hip/knee flexion- Right, decreased hip/knee flexion- Left, Right foot flat, Left foot flat, and narrow BOS, Distance walked: 61ft, Assistive device utilized:Walker - 2 wheeled, Level of assistance: Modified independence, and Comments:    FUNCTIONAL TESTS:  TUG: 25.71 seconds  DGI: 05/13/24: 8 / 24  L SLS: <0.1 seconds  R SLS: <0.1 seconds  Norms: 18-39  F: 43.5 seconds  M: 43.2 seconds 40-49  F: 40.4 seconds  M: 40.1 seconds 50-59  F:  36 seconds  M: 38.1 seconds 60-69  F: 25.1 seconds  M: 28.7 seconds 70-79  F: 11.3 seconds  M:  18.3 seconds  PATIENT SURVEYS:  ABC Scale:950 / 1600 = 59.4 %                                                                                                                              TREATMENT DATE:  05/27/24:   Laurene UE/LE [place] x 5' Seated: -Hip flexion then abduction to simulate getting in and out of vehicle over 6in hurdle Standing: -Hip flexion then abduction to simulate getting in and out of vehicle over 6in hurdle -STS 2x 10 first with red then yellow weight ball -Walking marches/butt kicks, 3 laps, 20 foot line, 4lb ankle weights -Sidestep with 4# on ankle, RTB on thigh 2RT front of mat -Reciprocal pattern 7in step height reciprocal ascend, step to descending, heavy UE support 1RT -Tandem Stance 2x 30    05/25/24 Therapeutic Exercise: -Standing hip abd and ext; 2 sets 10 reps w/ 3# , bilaterally, pt cued for upright trunk and maintaining of neutral spine  Therapeutic Activity: -Sit to stands from mat table - 10 reps without UE A; pt cued for core activation - Sit to stand + step fwd/bwd onto airex without UE A 3 out of 10 reps (single UE A needed for 7 reps) - Weighted side stepping without UE A and with cues for maintaining  -Step up and back, 2 set of 5 reps bilaterally, pt requires BUE support -Walking marches/butt kicks, 3 laps, 20 foot line, 3lb ankle weights -Lateral step up and overs, 1 set of 5 reps, bilaterally, pt requires BUE support  05/21/2024  Therapeutic Exercise: -Step up and overs, 1 set of 5 reps bilaterally, pt requires BUE support -Lateral step up and overs, 1 set of 5 reps, bilaterally, pt requires BUE support -Standing 3 way hip 2 sets 7 reps, bilaterally, pt cued for upright trunk and maintaining of neutral spine -Forward lunges on bosu ball, BUE support, 1 set of 5 reps better performance going into LLE, pt cued for core activation and upright posture -Walking marches/butt kicks, 3 laps, 20 foot line, 3lb ankle weights   Therapeutic  Activity: -Sit to stands with shoulder flexion with 5lb bar, 2 sets of 8 reps, pt cued for core activation -Step up and eccentric lowers on bosu ball, 1 set of 4 reps bilaterally, pt requires BUE support   05/19/24: BP 130/66 mmHg HR 83 bpm  STS 10x no HHA Toe tapping 6in step 2x 10 alternating 2 HHA 1st set, 1 HHA Sidestep with RTB around thigh 3RT Tandem stance with 1 HHA 2x 30 Tandem gait 2RT inside // bars Hamstring curls 10x each SLS with 1 heavy UE support x 30 (reports of LBP following this exercise)  Supine: Decompression 5x 5  PATIENT EDUCATION: Education details: PT Evaluation, findings, prognosis, frequency, attendance policy. Person educated: Patient Education method: Explanation and  Demonstration Education comprehension: verbalized understanding  HOME EXERCISE PROGRAM: Access Code: [email or web address] URL: [email or web address] Date: 05/14/2024 Prepared by: Augustin Mclean  Exercises - Sit to Stand  - 1 x daily - 7 x weekly - 3 sets - 10 reps - Standing March with Counter Support  - 1 x daily - 7 x weekly - 3 sets - 10 reps - Standing Hip Abduction with Counter Support  - 1 x daily - 7 x weekly - 3 sets - 10 reps - Side Stepping with Resistance at Thighs and Counter Support  - 1 x daily - 7 x weekly - 3 sets - 10 reps  GOALS: Goals reviewed with patient? No  SHORT TERM GOALS: Target date: 05/25/2024  Pt will be independent with HEP in order to demonstrate participation in Physical Therapy POC.  Baseline: Goal status: INITIAL  2.  Pt will improve ABC score by 15% in order to demonstrate improved pain with functional goals and outcomes. Baseline:  Goal status: INITIAL  LONG TERM GOALS: Target date: 06/22/2024  Pt will increase DGI score by > MCID in order to demonstrate improved functional safety and balance skills in ADL/mobility.   Baseline: see objective.  Goal status: INITIAL  2.  Pt will improve TUG by least 3 seconds in order to demonstrate  improved functional mobility capacity in community setting.  Baseline: see objective.  Goal status: INITIAL  3.  Pt will improve ABC score by 30% in order to demonstrate improved pain with functional goals and outcomes. Baseline: see objective.  Goal status: INITIAL  4.  Pt will improve single leg balance by at least 3 seconds bilaterally in order to demonstrate improved capacity, safety, and overall quality of movement to optimize function.  Baseline: see objective.  Goal status: INITIAL  ASSESSMENT:  CLINICAL IMPRESSION: Began session with Nustep to improve activity tolerance.  Added hurdles to improve hip strengthening and SLS to simulate getting in and out of vehicle.  Min A with static balance activities.  Pt tolerated well with session with occasional seated rest breaks, appropriate fatigue levels.  Continues to demonstrate decreased confidence with balance and upon standing with UE support due to fear of falling.  Reports increased numbness hands and feet today.  No reports of pain through session.  Pt reports she purchased compression garments today, encouraged to bring into dept for education on donning garments for increased ease.     Eval:  Patient is a 68y.o. female who was seen today for physical therapy evaluation and treatment for 'compression of spinal cord.'  Patient with history of myelopathy C5 spinal fracture, with A C4 through C7 cervical fusion.  Patient presents with significant functional mobility deficits, safety concerns, increased falls risk, reduced ambulation capacity at due to muscle weakness, decreased activity tolerance, balance deficits in setting of previous cervical myelopathy pt will benefit from skilled Physical Therapy services to address deficits/limitations in order to improve functional and QOL.    OBJECTIVE IMPAIRMENTS: Abnormal gait, decreased activity tolerance, decreased balance, decreased mobility, difficulty walking, decreased strength, and postural  dysfunction.   ACTIVITY LIMITATIONS: carrying, lifting, bending, standing, squatting, stairs, transfers, bed mobility, and locomotion level  PARTICIPATION LIMITATIONS: meal prep, cleaning, laundry, community activity, occupation, and yard work  PERSONAL FACTORS: Age are also affecting patient's functional outcome.   REHAB POTENTIAL: Excellent  CLINICAL DECISION MAKING: Stable/uncomplicated  EVALUATION COMPLEXITY: Low  PLAN:  PT FREQUENCY: 2x/week  PT DURATION: 8 weeks  PLANNED INTERVENTIONS: 97164- PT Re-evaluation, 97750- Physical Performance Testing, 97110-Therapeutic exercises,  02469- Therapeutic activity, [id number]- Neuromuscular re-education, [phone]- Self Care, 02859- Manual therapy, [phone]- Gait training, [phone]- Orthotic Initial, Patient/Family education, Balance training, Stair training, Taping, Joint mobilization, Joint manipulation, Spinal manipulation, Spinal mobilization, Cryotherapy, and Moist heat  PLAN FOR NEXT SESSION: Functional mobility, strengthening, ambulation, etc. Educated donning compression garments.  Augustin Mclean, LPTA/CLT; [facility] [phone]   4:28 PM, 05/27/24

## 2024-05-29 ENCOUNTER — Ambulatory Visit (HOSPITAL_COMMUNITY): Admitting: Occupational Therapy

## 2024-05-29 ENCOUNTER — Encounter (HOSPITAL_COMMUNITY): Payer: Self-pay | Admitting: Occupational Therapy

## 2024-05-29 DIAGNOSIS — R278 Other lack of coordination: Secondary | ICD-10-CM

## 2024-05-29 DIAGNOSIS — R29818 Other symptoms and signs involving the nervous system: Secondary | ICD-10-CM

## 2024-05-29 NOTE — Patient Instructions (Signed)

## 2024-05-29 NOTE — Therapy (Signed)
 OUTPATIENT OCCUPATIONAL THERAPY NEURO TREATMENT NOTE  Patient Name: Angela Norman MRN: [id number] DOB:06-12-1956, 68 y.o., female Today's Date: 05/29/2024  PCP: Shona Norleen PEDLAR, MD REFERRING PROVIDER: Hillman Amour, MD  END OF SESSION:  OT End of Session - 05/29/24 1014     Visit Number 4    Number of Visits 9    Date for OT Re-Evaluation 06/19/24    Authorization Type Medicare Part A and B    OT Start Time 0933    OT Stop Time 1014    OT Time Calculation (min) 41 min    Activity Tolerance Patient tolerated treatment well    Behavior During Therapy WFL for tasks assessed/performed             Past Medical History:  Diagnosis Date   Anxiety    Arthritis    Basal cell carcinoma    Charcot's joint of foot    bilateral   Diabetes mellitus without complication (HCC)    Fibromyalgia    Neuropathy    Past Surgical History:  Procedure Laterality Date   ACHILLES TENDON REPAIR Left    bone spur Left    --left foot   BREAST SURGERY     benign breast bx--left--fatty tissue   CARPAL TUNNEL RELEASE Bilateral    COLONOSCOPY     COLONOSCOPY WITH PROPOFOL  N/A 09/06/2022   Procedure: COLONOSCOPY WITH PROPOFOL ;  Surgeon: Shaaron Lamar HERO, MD;  [facility]: [facility];  Service: Endoscopy;  Laterality: N/A;  10:30am, asa 2   DILATION AND CURETTAGE OF UTERUS  2011   for cervical polyp   FOOT SURGERY Left 03/2018   reconstructive surgery   POLYPECTOMY  09/06/2022   Procedure: POLYPECTOMY;  Surgeon: Shaaron Lamar HERO, MD;  Location: [facility];  Service: Endoscopy;;   Patient Active Problem List   Diagnosis Date Noted   Anxiety 06/28/2022   Arthritis 06/28/2022   Body fluid retention 06/28/2022   Hyperglycemia due to type 2 diabetes mellitus (HCC) 06/28/2022   Mixed hyperlipidemia 06/28/2022   Fibromyalgia 06/28/2022   Cough 10/25/2021   Musculoskeletal pain 05/21/2012   Stiffness of joint, not elsewhere classified, ankle and foot 08/13/2011   Difficulty walking  08/13/2011   Ankle weakness 08/13/2011    ONSET DATE: 08/2023  REFERRING DIAG:  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 Pt had multiple bouts of acute and inpatient rehab stays due to complications. Significant BP issues throughout this time. Had stay in multiple facilities. Pt reported various changes in functional status, was requiring assist for ADLs and mobility since April 28th. Since then, pt has been home with Home Health services. Pt continues to report numbness in BUE and poor grip strength. Pt reports neuropathy in BLE. Prior to onset of November '24 was walking daily for 45-60 minutes.   PRECAUTIONS: Fall  WEIGHT BEARING RESTRICTIONS: No  PAIN:  Are you having pain? Yes: NPRS scale: 2/10 Pain location: left arm Pain description: numbness/tingling Aggravating factors: rainy days Relieving factors: medication  FALLS: Has patient fallen in last 6 months? No  LIVING ENVIRONMENT: Lives with: lives with their spouse Lives in: House/apartment  Stairs: Pt lives in the basement with no stairs Has following equipment at home: Vannie - 2 wheeled and shower chair  PLOF: Independent  PATIENT GOALS: To improve mobility and strengthening  OBJECTIVE:  Note: Objective measures were completed at Evaluation unless otherwise noted.  HAND DOMINANCE: Right  ADLs: Overall ADLs: Pt has difficulty with all small object manipulation, including but not limited to buttons, shoe laces, and zippers. Additionally she has max difficulty with cooking and cleaning due to weakness in both hands and  wrists.   MOBILITY STATUS: Hx of falls, difficulty with turns, and difficulty carrying objections with ambulation  POSTURE COMMENTS:  rounded shoulders Sitting balance: Moves/returns truncal midpoint 1-2 inches in multiple planes  ACTIVITY TOLERANCE: Activity tolerance: Fair tolerance - fatigues quicker than normal  FUNCTIONAL OUTCOME MEASURES: Quick Dash: 47.73  UPPER EXTREMITY ROM:    All ROM is WFL  UPPER EXTREMITY MMT:     MMT Right eval Left eval  Shoulder flexion 5/5 5/5  Shoulder abduction 5/5 5/5  Shoulder internal rotation 5/5 5/5  Shoulder external rotation 5/5 5/5  Elbow flexion 5/5 5/5  Elbow extension 5/5 5/5  Wrist flexion 4/5 4+/5  Wrist extension 4-/5 4/5  Wrist ulnar deviation 4-/5 4/5  Wrist radial deviation 4-/5 4-/5  Wrist pronation 4/5 4+/5  Wrist supination 4/5 4/5  (Blank rows = not tested)  HAND FUNCTION: Grip strength: Right: 20 lbs; Left: 27 lbs, Lateral pinch: Right: 5 lbs, Left: 9 lbs, and 3 point pinch: Right: 5 lbs, Left: 4 lbs  COORDINATION: 9 Hole Peg test: Right: -- sec; Left: -- sec  SENSATION: Pt has full numbness along median nerve of R hand as well as dulled sensation and tingling in BUE up to elbows.   EDEMA: No swelling noted  OBSERVATIONS: adducted thumbs and 80% grip                                                                                                                             TREATMENT DATE:  05/29/24 -Digit ROM: composite flexion, abduction, finger taps, opposition, x12 -Towel crumples x3 each hand - focusing on R hand crumpling finger by finger to improve dexterity.  -Simulating washing L side of body with washcloth in the R hand -Wrist ROM: 2#, extension, ulnar deviation, radial deviation, flexion, supination/pronation, x10 -Checkers: RUE - flipping 10 checkers over, holding 2 at a time and placing in connect 4 board  05/26/24 -Scapular retraction, 10 reps -Shoulder A/ROM: protraction, flexion,  abduction, horizontal abduction, 10 reps -Proximal shoulder strengthening: sitting-paddles, criss cross, circles each direction, 10 reps -ABC writing, BUE, shoulders at 90 degrees flexion -Red theraputty: BUE-flatten, using pvc pipe to cut circles into putty, rolling, gripping, 3 point pinch -Sponges: right hand-16, 13; left hand-18, 19  05/22/24 -Shoulder Shrugs x10 -Scapular Retraction x10 -Cervical Stretches: flexion/extension, rotations, tilts, chin tucks, 4x10 -Wrist ROM: 2#, extension, ulnar deviation, radial deviation, flexion, supination/pronation, x10 -Digit ROM: composite flexion, abduction, finger taps,  opposition, x10      PATIENT EDUCATION: Education details: Shoulder A/ROM Person educated: Patient Education method: Explanation, Demonstration, and Handouts Education comprehension: verbalized understanding and returned demonstration  HOME EXERCISE PROGRAM: 8/1: Wrist ROM and Digit ROM 8/8: Shoulder A/ROM   GOALS: Goals reviewed with patient? Yes  SHORT TERM GOALS: Target date: 06/19/24  Pt will be provided and educated on a comprehensive HEP for BUE mobility in order to complete ADL's and IADL's independently.   Goal status: IN PROGRESS  2.  Pt will improve BUE strength to 5/5 in order to lift and carry items during cooking and cleaning tasks.   Goal status: IN PROGRESS  3.  Pt will improve BUE grip by 15# and pinch by 3# in order to grasp and hold pots and pans.  Goal status: IN PROGRESS  4.  Pt will improve BUE coordination by completing 9 hole peg test in 45 or less in order to manipulate and complete fine motor tasks.   Goal status: IN PROGRESS   ASSESSMENT:  CLINICAL IMPRESSION: This session pt working on her fine motor and hand motor planning tasks. She reports that holding a wash cloth and bathing her self with her R hand continues to be difficult. OT had pt working on towel crumples, where she struggles to complete with crumpling/gripping the  towel with 1 finger at a time to move it. Her RUE fingers continue to struggle with fully extending. OT providing verbal and tactile cuing for positioning and technique throughout session.     PERFORMANCE DEFICITS: in functional skills including ADLs, IADLs, coordination, dexterity, sensation, ROM, strength, pain, fascial restrictions, Fine motor control, Gross motor control, body mechanics, and UE functional use.    PLAN:  OT FREQUENCY: 2x/week  OT DURATION: 4 weeks  PLANNED INTERVENTIONS: 97168 OT Re-evaluation, 97535 self care/ADL training, 02889 therapeutic exercise, 97530 therapeutic activity, 97112 neuromuscular re-education, 97140 manual therapy, 97035 ultrasound, 97018 paraffin, 02989 moist heat, 97032 electrical stimulation (manual), passive range of motion, functional mobility training, energy conservation, coping strategies training, patient/family education, and DME and/or AE instructions  CONSULTED AND AGREED WITH PLAN OF CARE: Patient  PLAN FOR NEXT SESSION: Wrist and Digit ROM, strengthening, coordination tasks   Valentin Nightingale, OTR/L  [phone] 05/29/2024, 2:53 PM

## 2024-06-01 ENCOUNTER — Ambulatory Visit (HOSPITAL_COMMUNITY)

## 2024-06-01 DIAGNOSIS — Z7409 Other reduced mobility: Secondary | ICD-10-CM

## 2024-06-01 DIAGNOSIS — G9520 Unspecified cord compression: Secondary | ICD-10-CM

## 2024-06-01 DIAGNOSIS — R29818 Other symptoms and signs involving the nervous system: Secondary | ICD-10-CM

## 2024-06-01 DIAGNOSIS — R278 Other lack of coordination: Secondary | ICD-10-CM

## 2024-06-01 DIAGNOSIS — M6281 Muscle weakness (generalized): Secondary | ICD-10-CM

## 2024-06-01 NOTE — Therapy (Signed)
 OUTPATIENT PHYSICAL THERAPY NEURO TREATMENT   Patient Name: Angela Norman MRN: [id number] DOB:Apr 17, 1956, 68 y.o., female Today's Date: 06/01/2024   PCP: Sebastian Othel GAILS, FNP REFERRING PROVIDER: Edna Norris, MD  END OF SESSION:  PT End of Session - 06/01/24 0933     Visit Number 7    Number of Visits 16    Date for PT Re-Evaluation 06/22/24    Authorization Type Medicare A & B    PT Start Time 0932    PT Stop Time 1012    PT Time Calculation (min) 40 min    Equipment Utilized During Treatment Gait belt    Activity Tolerance Patient tolerated treatment well    Behavior During Therapy WFL for tasks assessed/performed             Past Medical History:  Diagnosis Date   Anxiety    Arthritis    Basal cell carcinoma    Charcot's joint of foot    bilateral   Diabetes mellitus without complication (HCC)    Fibromyalgia    Neuropathy    Past Surgical History:  Procedure Laterality Date   ACHILLES TENDON REPAIR Left    bone spur Left    --left foot   BREAST SURGERY     benign breast bx--left--fatty tissue   CARPAL TUNNEL RELEASE Bilateral    COLONOSCOPY     COLONOSCOPY WITH PROPOFOL  N/A 09/06/2022   Procedure: COLONOSCOPY WITH PROPOFOL ;  Surgeon: Shaaron Lamar HERO, MD;  [facility]: [facility];  Service: Endoscopy;  Laterality: N/A;  10:30am, asa 2   DILATION AND CURETTAGE OF UTERUS  2011   for cervical polyp   FOOT SURGERY Left 03/2018   reconstructive surgery   POLYPECTOMY  09/06/2022   Procedure: POLYPECTOMY;  Surgeon: Shaaron Lamar HERO, MD;  [facility]: [facility];  Service: Endoscopy;;   Patient Active Problem List   Diagnosis Date Noted   Anxiety 06/28/2022   Arthritis 06/28/2022   Body fluid retention 06/28/2022   Hyperglycemia due to type 2 diabetes mellitus (HCC) 06/28/2022   Mixed hyperlipidemia 06/28/2022   Fibromyalgia 06/28/2022   Cough 10/25/2021   Musculoskeletal pain 05/21/2012   Stiffness of joint, not elsewhere classified,  ankle and foot 08/13/2011   Difficulty walking 08/13/2011   Ankle weakness 08/13/2011    ONSET DATE: November 2024  REFERRING DIAG: compression of spinal cord   THERAPY DIAG:  Other lack of coordination  Other symptoms and signs involving the nervous system  Compression of spinal cord (HCC)  Impaired functional mobility, balance, gait, and endurance  Muscle weakness (generalized)  Rationale for Evaluation and Treatment: Rehabilitation  SUBJECTIVE:  SUBJECTIVE STATEMENT: Able to go out and visit her son over the weekend; did a lot of walking over the weekend on Friday and Sat and had some increased foot pain on Sat evening but after resting that has returned to baseline.  Going to the dermatologist today due to some redness, swelling and itching left eye.  Compression garments have not come in yet.     Eval:  Pt reported all this started with UTI that caused fracture f neck and indicated as C4-C7 fusion. Pt had multiple bouts of acute and inpatient rehab stays due to complications. Significant BP issues throughout this time. Had stay in multiple facilities. Pt reported various changes in functional status, was requiring assist for ADLs and mobility since April 28th. Since then, pt has been home with Home Health services. Pt continues to report numbness in BUE and poor grip strength. Pt reports neuropathy in BLE. Prior to onset of November '24 was walking daily for 45-60 minutes.   Pt has CT scan for nodule on Lung on 04/28/24.  Pt accompanied by: self  PERTINENT HISTORY:  Cervical Myelopathy with ACDF 4-7 in November 2024\ Left ankle surgery x 3  PAIN:  Are you having pain? No and chronic pain in left shoulder  PRECAUTIONS: None  RED FLAGS: Bowel or bladder incontinence: Yes: SCI   WEIGHT  BEARING RESTRICTIONS: No  FALLS: Has patient fallen in last 6 months? Yes. Number of falls 1-2   PATIENT GOALS:  want to walk with AD well and with confidence and strengthening RUE.  OBJECTIVE:  Note: Objective measures were completed at Evaluation unless otherwise noted.  DIAGNOSTIC FINDINGS:   COGNITION: Overall cognitive status: Within functional limits for tasks assessed   SENSATION: Light touch: decreased light touch from lateral malleolus distally.   LOWER EXTREMITY ROM:     Active  Right Eval Left Eval  Hip flexion    Hip extension    Hip abduction    Hip adduction    Hip internal rotation    Hip external rotation    Knee flexion    Knee extension    Ankle dorsiflexion    Ankle plantarflexion    Ankle inversion    Ankle eversion     (Blank rows = not tested)  LOWER EXTREMITY MMT:    MMT Right Eval Left Eval  Hip flexion    Hip extension    Hip abduction    Hip adduction    Hip internal rotation    Hip external rotation    Knee flexion    Knee extension 4- 4+  Ankle dorsiflexion    Ankle plantarflexion    Ankle inversion    Ankle eversion    (Blank rows = not tested)   TRANSFERS: Sit to stand: Modified independence  Assistive device utilized: Environmental consultant - 2 wheeled     Stand to sit: Modified independence  Assistive device utilized: Environmental consultant - 2 wheeled     Chair to chair: SBA  Assistive device utilized: Environmental consultant - 2 wheeled       GAIT: Findings: Gait Characteristics: step through pattern, decreased arm swing- Right, decreased arm swing- Left, decreased step length- Right, decreased step length- Left, decreased hip/knee flexion- Right, decreased hip/knee flexion- Left, Right foot flat, Left foot flat, and narrow BOS, Distance walked: 6ft, Assistive device utilized:Walker - 2 wheeled, Level of assistance: Modified independence, and Comments:    FUNCTIONAL TESTS:  TUG: 25.71 seconds  DGI: 05/13/24: 8 / 24  L SLS: <0.1 seconds  R SLS: <0.1  seconds  Norms: 18-39  F: 43.5 seconds  M: 43.2 seconds 40-49  F: 40.4 seconds  M: 40.1 seconds 50-59  F: 36 seconds  M: 38.1 seconds 60-69  F: 25.1 seconds  M: 28.7 seconds 70-79  F: 11.3 seconds  M: 18.3 seconds  PATIENT SURVEYS:  ABC Scale:950 / 1600 = 59.4 %                                                                                                                              TREATMENT DATE:  06/01/24 Nustep seat 9 x 5' dynamic warm up level 2 Sit to stand from standard chair with yellow med ball 2 x 10 4# hip abduction 2 x 10 4# hip extension 2 x 10 4# on legs stepping over small hurdles x 3 in // bars down and back x 4 with 1 UE assist Seated 4# LAQ's 2 x 10  05/27/24:   Nustep UE/LE [place] x 5' Seated: -Hip flexion then abduction to simulate getting in and out of vehicle over 6in hurdle Standing: -Hip flexion then abduction to simulate getting in and out of vehicle over 6in hurdle -STS 2x 10 first with red then yellow weight ball -Walking marches/butt kicks, 3 laps, 20 foot line, 4lb ankle weights -Sidestep with 4# on ankle, RTB on thigh 2RT front of mat -Reciprocal pattern 7in step height reciprocal ascend, step to descending, heavy UE support 1RT -Tandem Stance 2x 30    05/25/24 Therapeutic Exercise: -Standing hip abd and ext; 2 sets 10 reps w/ 3# , bilaterally, pt cued for upright trunk and maintaining of neutral spine  Therapeutic Activity: -Sit to stands from mat table - 10 reps without UE A; pt cued for core activation - Sit to stand + step fwd/bwd onto airex without UE A 3 out of 10 reps (single UE A needed for 7 reps) - Weighted side stepping without UE A and with cues for maintaining  -Step up and back, 2 set of 5 reps bilaterally, pt requires BUE support -Walking marches/butt kicks, 3 laps, 20 foot line, 3lb ankle weights -Lateral step up and overs, 1 set of 5 reps, bilaterally, pt requires BUE support  05/21/2024  Therapeutic  Exercise: -Step up and overs, 1 set of 5 reps bilaterally, pt requires BUE support -Lateral step up and overs, 1 set of 5 reps, bilaterally, pt requires BUE support -Standing 3 way hip 2 sets 7 reps, bilaterally, pt cued for upright trunk and maintaining of neutral spine -Forward lunges on bosu ball, BUE support, 1 set of 5 reps better performance going into LLE, pt cued for core activation and upright posture -Walking marches/butt kicks, 3 laps, 20 foot line, 3lb ankle weights   Therapeutic Activity: -Sit to stands with shoulder flexion with 5lb bar, 2 sets of 8 reps, pt cued for core activation -Step up and eccentric lowers on bosu ball, 1 set of 4 reps bilaterally, pt  requires BUE support   05/19/24: BP 130/66 mmHg HR 83 bpm  STS 10x no HHA Toe tapping 6in step 2x 10 alternating 2 HHA 1st set, 1 HHA Sidestep with RTB around thigh 3RT Tandem stance with 1 HHA 2x 30 Tandem gait 2RT inside // bars Hamstring curls 10x each SLS with 1 heavy UE support x 30 (reports of LBP following this exercise)  Supine: Decompression 5x 5  PATIENT EDUCATION: Education details: PT Evaluation, findings, prognosis, frequency, attendance policy. Person educated: Patient Education method: Explanation and Demonstration Education comprehension: verbalized understanding  HOME EXERCISE PROGRAM: Access Code: [email or web address] URL: [email or web address] Date: 05/14/2024 Prepared by: Augustin Mclean  Exercises - Sit to Stand  - 1 x daily - 7 x weekly - 3 sets - 10 reps - Standing March with Counter Support  - 1 x daily - 7 x weekly - 3 sets - 10 reps - Standing Hip Abduction with Counter Support  - 1 x daily - 7 x weekly - 3 sets - 10 reps - Side Stepping with Resistance at Thighs and Counter Support  - 1 x daily - 7 x weekly - 3 sets - 10 reps  GOALS: Goals reviewed with patient? No  SHORT TERM GOALS: Target date: 05/25/2024  Pt will be independent with HEP in order to demonstrate  participation in Physical Therapy POC.  Baseline: Goal status: INITIAL  2.  Pt will improve ABC score by 15% in order to demonstrate improved pain with functional goals and outcomes. Baseline:  Goal status: INITIAL  LONG TERM GOALS: Target date: 06/22/2024  Pt will increase DGI score by > MCID in order to demonstrate improved functional safety and balance skills in ADL/mobility.   Baseline: see objective.  Goal status: INITIAL  2.  Pt will improve TUG by least 3 seconds in order to demonstrate improved functional mobility capacity in community setting.  Baseline: see objective.  Goal status: INITIAL  3.  Pt will improve ABC score by 30% in order to demonstrate improved pain with functional goals and outcomes. Baseline: see objective.  Goal status: INITIAL  4.  Pt will improve single leg balance by at least 3 seconds bilaterally in order to demonstrate improved capacity, safety, and overall quality of movement to optimize function.  Baseline: see objective.  Goal status: INITIAL  ASSESSMENT:  CLINICAL IMPRESSION: Began session with Nustep for dynamic warm up.  Needed min A for initial boost up to standing with sit to stand for the first 3 reps but then improves to CGA only.  Continued with weighted exercise for strengthening.  Able to perform step over hurdles with mostly 1 UE assist occasionally has to use 2 hands to maintain balance.  Patient working hard in therapy to improve.  Patient will benefit from continued skilled therapy services to address deficits and promote return to optimal function.      Eval:  Patient is a 68y.o. female who was seen today for physical therapy evaluation and treatment for 'compression of spinal cord.'  Patient with history of myelopathy C5 spinal fracture, with A C4 through C7 cervical fusion.  Patient presents with significant functional mobility deficits, safety concerns, increased falls risk, reduced ambulation capacity at due to muscle weakness,  decreased activity tolerance, balance deficits in setting of previous cervical myelopathy pt will benefit from skilled Physical Therapy services to address deficits/limitations in order to improve functional and QOL.    OBJECTIVE IMPAIRMENTS: Abnormal gait, decreased activity tolerance, decreased balance, decreased mobility, difficulty walking, decreased strength, and  postural dysfunction.   ACTIVITY LIMITATIONS: carrying, lifting, bending, standing, squatting, stairs, transfers, bed mobility, and locomotion level  PARTICIPATION LIMITATIONS: meal prep, cleaning, laundry, community activity, occupation, and yard work  PERSONAL FACTORS: Age are also affecting patient's functional outcome.   REHAB POTENTIAL: Excellent  CLINICAL DECISION MAKING: Stable/uncomplicated  EVALUATION COMPLEXITY: Low  PLAN:  PT FREQUENCY: 2x/week  PT DURATION: 8 weeks  PLANNED INTERVENTIONS: 97164- PT Re-evaluation, 97750- Physical Performance Testing, 97110-Therapeutic exercises, 97530- Therapeutic activity, [id number]- Neuromuscular re-education, 97535- Self Care, 02859- Manual therapy, [phone]- Gait training, [phone]- Orthotic Initial, Patient/Family education, Balance training, Stair training, Taping, Joint mobilization, Joint manipulation, Spinal manipulation, Spinal mobilization, Cryotherapy, and Moist heat  PLAN FOR NEXT SESSION: Functional mobility, strengthening, ambulation, etc. Educated donning compression garments.  9:34 AM, 06/01/24 Izacc Demeyer Small Printice Hellmer MPT [facility] [place] [phone]

## 2024-06-03 ENCOUNTER — Ambulatory Visit (HOSPITAL_COMMUNITY): Admitting: Occupational Therapy

## 2024-06-03 ENCOUNTER — Encounter (HOSPITAL_COMMUNITY): Admitting: Occupational Therapy

## 2024-06-03 ENCOUNTER — Encounter (HOSPITAL_COMMUNITY): Payer: Self-pay | Admitting: Occupational Therapy

## 2024-06-03 DIAGNOSIS — R278 Other lack of coordination: Secondary | ICD-10-CM

## 2024-06-03 DIAGNOSIS — R29818 Other symptoms and signs involving the nervous system: Secondary | ICD-10-CM

## 2024-06-03 NOTE — Therapy (Signed)
 OUTPATIENT OCCUPATIONAL THERAPY NEURO TREATMENT NOTE  Patient Name: Angela Norman MRN: [id number] DOB:04-18-1956, 68 y.o., female Today's Date: 06/03/2024  PCP: Shona Norleen PEDLAR, MD REFERRING PROVIDER: Hillman Amour, MD  END OF SESSION:    06/03/24 1538  OT Visits / Re-Eval  Visit Number 5  Number of Visits 9  Date for OT Re-Evaluation 06/19/24  Authorization  Authorization Type Medicare Part A and B  OT Time Calculation  OT Start Time 1455  OT Stop Time 1538  OT Time Calculation (min) 43 min  End of Session  Activity Tolerance Patient tolerated treatment well  Behavior During Therapy WFL for tasks assessed/performed       Past Medical History:  Diagnosis Date   Anxiety    Arthritis    Basal cell carcinoma    Charcot's joint of foot    bilateral   Diabetes mellitus without complication (HCC)    Fibromyalgia    Neuropathy    Past Surgical History:  Procedure Laterality Date   ACHILLES TENDON REPAIR Left    bone spur Left    --left foot   BREAST SURGERY     benign breast bx--left--fatty tissue   CARPAL TUNNEL RELEASE Bilateral    COLONOSCOPY     COLONOSCOPY WITH PROPOFOL  N/A 09/06/2022   Procedure: COLONOSCOPY WITH PROPOFOL ;  Surgeon: Shaaron Lamar HERO, MD;  Location: [facility];  Service: Endoscopy;  Laterality: N/A;  10:30am, asa 2   DILATION AND CURETTAGE OF UTERUS  2011   for cervical polyp   FOOT SURGERY Left 03/2018   reconstructive surgery   POLYPECTOMY  09/06/2022   Procedure: POLYPECTOMY;  Surgeon: Shaaron Lamar HERO, MD;  Location: [facility];  Service: Endoscopy;;   Patient Active Problem List   Diagnosis Date Noted   Anxiety 06/28/2022   Arthritis 06/28/2022   Body fluid retention 06/28/2022   Hyperglycemia due to type 2 diabetes mellitus (HCC) 06/28/2022   Mixed hyperlipidemia 06/28/2022   Fibromyalgia 06/28/2022   Cough 10/25/2021   Musculoskeletal pain 05/21/2012   Stiffness of joint, not elsewhere classified, ankle and foot  08/13/2011   Difficulty walking 08/13/2011   Ankle weakness 08/13/2011    ONSET DATE: 08/2023  REFERRING DIAG:  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  Rationale for Evaluation and Treatment: Rehabilitation  SUBJECTIVE:   SUBJECTIVE STATEMENT: S: Things are still about the same  PERTINENT HISTORY: Pt reported all this started with UTI that caused fracture f neck and indicated as C4-C7 fusion. Pt had multiple bouts of acute and inpatient rehab stays due to complications. Significant BP issues throughout this time. Had stay in multiple facilities. Pt reported various changes in functional status, was requiring assist for ADLs and mobility since April 28th. Since then, pt has been home with Home Health services. Pt continues to report numbness in BUE and poor grip strength. Pt reports neuropathy in BLE. Prior to onset of November '24 was walking daily for 45-60 minutes.   PRECAUTIONS: Fall  WEIGHT BEARING RESTRICTIONS: No  PAIN:  Are you having pain? Yes: NPRS scale: 2/10 Pain location: left arm Pain description: numbness/tingling Aggravating factors: rainy days Relieving factors: medication  FALLS: Has patient fallen in last 6 months? No  LIVING ENVIRONMENT: Lives with: lives with their spouse Lives in: House/apartment Stairs: Pt lives in the basement with no stairs Has following equipment at home: Vannie - 2 wheeled and shower chair  PLOF: Independent  PATIENT GOALS: To improve mobility and strengthening  OBJECTIVE:  Note: Objective measures were completed at Evaluation unless otherwise noted.  HAND DOMINANCE: Right  ADLs: Overall ADLs: Pt has difficulty with all small object manipulation, including but not limited to buttons, shoe laces, and zippers. Additionally she has max difficulty with cooking and cleaning due to weakness in both hands and wrists.   MOBILITY STATUS: Hx of falls,  difficulty with turns, and difficulty carrying objections with ambulation  POSTURE COMMENTS:  rounded shoulders Sitting balance: Moves/returns truncal midpoint 1-2 inches in multiple planes  ACTIVITY TOLERANCE: Activity tolerance: Fair tolerance - fatigues quicker than normal  FUNCTIONAL OUTCOME MEASURES: Quick Dash: 47.73  UPPER EXTREMITY ROM:    All ROM is WFL  UPPER EXTREMITY MMT:     MMT Right eval Left eval  Shoulder flexion 5/5 5/5  Shoulder abduction 5/5 5/5  Shoulder internal rotation 5/5 5/5  Shoulder external rotation 5/5 5/5  Elbow flexion 5/5 5/5  Elbow extension 5/5 5/5  Wrist flexion 4/5 4+/5  Wrist extension 4-/5 4/5  Wrist ulnar deviation 4-/5 4/5  Wrist radial deviation 4-/5 4-/5  Wrist pronation 4/5 4+/5  Wrist supination 4/5 4/5  (Blank rows = not tested)  HAND FUNCTION: Grip strength: Right: 20 lbs; Left: 27 lbs, Lateral pinch: Right: 5 lbs, Left: 9 lbs, and 3 point pinch: Right: 5 lbs, Left: 4 lbs  COORDINATION: 9 Hole Peg test: Right: -- sec; Left: -- sec  SENSATION: Pt has full numbness along median nerve of R hand as well as dulled sensation and tingling in BUE up to elbows.   EDEMA: No swelling noted  OBSERVATIONS: adducted thumbs and 80% grip                                                                                                                             TREATMENT DATE:   06/03/24 -Shoulder Strengthening: 1#, flexion, abduction, protraction, horizontal abduction, er/IR, x15 -Digit ROM: composite flexion, abduction, finger taps, opposition, x12 -Towel crumples x3 each hand - focusing on R hand crumpling finger by finger to improve dexterity.  -Therabar: yellow, extension and flexion, supination/pronation bends, radial/ulnar bends, x15 each -Grooved peg board  05/29/24 -Digit ROM: composite flexion, abduction, finger taps, opposition, x12 -Towel crumples x3 each hand - focusing on R hand crumpling finger by finger to improve  dexterity.  -Simulating washing L side of body with washcloth in the R hand -Wrist ROM: 2#, extension, ulnar deviation, radial deviation, flexion, supination/pronation, x10 -Checkers: RUE - flipping 10 checkers over, holding 2 at a time and placing in connect 4 board  05/26/24 -Scapular retraction, 10 reps -Shoulder A/ROM: protraction, flexion, abduction, horizontal abduction, 10 reps -Proximal shoulder strengthening: sitting-paddles, criss cross, circles each direction, 10 reps -ABC writing, BUE, shoulders at 90 degrees flexion -Red theraputty: BUE-flatten, using pvc pipe to cut circles into putty, rolling, gripping, 3 point pinch -Sponges: right hand-16, 13; left hand-18, 19    PATIENT EDUCATION:  Education details: continue HEP Person educated: Patient Education method: Explanation, Demonstration, and Handouts Education comprehension: verbalized understanding and returned demonstration  HOME EXERCISE PROGRAM: 8/1: Wrist ROM and Digit ROM 8/8: Shoulder A/ROM   GOALS: Goals reviewed with patient? Yes  SHORT TERM GOALS: Target date: 06/19/24  Pt will be provided and educated on a comprehensive HEP for BUE mobility in order to complete ADL's and IADL's independently.   Goal status: IN PROGRESS  2.  Pt will improve BUE strength to 5/5 in order to lift and carry items during cooking and cleaning tasks.   Goal status: IN PROGRESS  3.  Pt will improve BUE grip by 15# and pinch by 3# in order to grasp and hold pots and pans.  Goal status: IN PROGRESS  4.  Pt will improve BUE coordination by completing 9 hole peg test in 45 or less in order to manipulate and complete fine motor tasks.   Goal status: IN PROGRESS   ASSESSMENT:  CLINICAL IMPRESSION: Pt working on her overall strengthening and fine motor skills this session. She struggles with lifting more than 1# during shoulder exercises. Pt also started using therabar's this session for wrist strengthening which she felt like  she had difficulty controlling. OT providing verbal and tactile cuing for positioning and technique.    PERFORMANCE DEFICITS: in functional skills including ADLs, IADLs, coordination, dexterity, sensation, ROM, strength, pain, fascial restrictions, Fine motor control, Gross motor control, body mechanics, and UE functional use.    PLAN:  OT FREQUENCY: 2x/week  OT DURATION: 4 weeks  PLANNED INTERVENTIONS: 97168 OT Re-evaluation, 97535 self care/ADL training, 02889 therapeutic exercise, 97530 therapeutic activity, 97112 neuromuscular re-education, 97140 manual therapy, 97035 ultrasound, 97018 paraffin, 02989 moist heat, 97032 electrical stimulation (manual), passive range of motion, functional mobility training, energy conservation, coping strategies training, patient/family education, and DME and/or AE instructions  CONSULTED AND AGREED WITH PLAN OF CARE: Patient  PLAN FOR NEXT SESSION: Wrist and Digit ROM, strengthening, coordination tasks   Valentin Nightingale, OTR/L  [phone] 06/03/2024, 2:57 PM

## 2024-06-05 ENCOUNTER — Encounter (HOSPITAL_COMMUNITY): Payer: Self-pay | Admitting: Occupational Therapy

## 2024-06-05 ENCOUNTER — Ambulatory Visit (HOSPITAL_COMMUNITY): Admitting: Occupational Therapy

## 2024-06-05 DIAGNOSIS — R29818 Other symptoms and signs involving the nervous system: Secondary | ICD-10-CM

## 2024-06-05 DIAGNOSIS — R278 Other lack of coordination: Secondary | ICD-10-CM | POA: Diagnosis not present

## 2024-06-05 NOTE — Therapy (Unsigned)
 OUTPATIENT OCCUPATIONAL THERAPY NEURO TREATMENT NOTE  Patient Name: Angela Norman MRN: [id number] DOB:1956/06/23, 68 y.o., female Today's Date: 06/05/2024  PCP: Shona Norleen PEDLAR, MD REFERRING PROVIDER: Hillman Amour, MD  END OF SESSION:    06/05/24 1116  OT Visits / Re-Eval  Visit Number 6  Number of Visits 9  Date for OT Re-Evaluation 06/19/24  Authorization  Authorization Type Medicare Part A and B  OT Time Calculation  OT Start Time 1037  OT Stop Time 1116  OT Time Calculation (min) 39 min  End of Session  Activity Tolerance Patient tolerated treatment well  Behavior During Therapy WFL for tasks assessed/performed    Past Medical History:  Diagnosis Date   Anxiety    Arthritis    Basal cell carcinoma    Charcot's joint of foot    bilateral   Diabetes mellitus without complication (HCC)    Fibromyalgia    Neuropathy    Past Surgical History:  Procedure Laterality Date   ACHILLES TENDON REPAIR Left    bone spur Left    --left foot   BREAST SURGERY     benign breast bx--left--fatty tissue   CARPAL TUNNEL RELEASE Bilateral    COLONOSCOPY     COLONOSCOPY WITH PROPOFOL  N/A 09/06/2022   Procedure: COLONOSCOPY WITH PROPOFOL ;  Surgeon: Shaaron Lamar HERO, MD;  Location: [facility];  Service: Endoscopy;  Laterality: N/A;  10:30am, asa 2   DILATION AND CURETTAGE OF UTERUS  2011   for cervical polyp   FOOT SURGERY Left 03/2018   reconstructive surgery   POLYPECTOMY  09/06/2022   Procedure: POLYPECTOMY;  Surgeon: Shaaron Lamar HERO, MD;  Location: [facility];  Service: Endoscopy;;   Patient Active Problem List   Diagnosis Date Noted   Anxiety 06/28/2022   Arthritis 06/28/2022   Body fluid retention 06/28/2022   Hyperglycemia due to type 2 diabetes mellitus (HCC) 06/28/2022   Mixed hyperlipidemia 06/28/2022   Fibromyalgia 06/28/2022   Cough 10/25/2021   Musculoskeletal pain 05/21/2012   Stiffness of joint, not elsewhere classified, ankle and foot  08/13/2011   Difficulty walking 08/13/2011   Ankle weakness 08/13/2011    ONSET DATE: 08/2023  REFERRING DIAG:  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  Rationale for Evaluation and Treatment: Rehabilitation  SUBJECTIVE:   SUBJECTIVE STATEMENT: S: I'm feeling lightheaded  PERTINENT HISTORY: Pt reported all this started with UTI that caused fracture f neck and indicated as C4-C7 fusion. Pt had multiple bouts of acute and inpatient rehab stays due to complications. Significant BP issues throughout this time. Had stay in multiple facilities. Pt reported various changes in functional status, was requiring assist for ADLs and mobility since April 28th. Since then, pt has been home with Home Health services. Pt continues to report numbness in BUE and poor grip strength. Pt reports neuropathy in BLE. Prior to onset of November '24 was walking daily for 45-60 minutes.   PRECAUTIONS: Fall  WEIGHT BEARING RESTRICTIONS: No  PAIN:  Are you having pain? Yes: NPRS scale: 2/10 Pain location: left arm Pain description: numbness/tingling Aggravating factors: rainy days Relieving factors: medication  FALLS: Has patient fallen in last 6 months? No  LIVING ENVIRONMENT: Lives with: lives with their spouse Lives in: House/apartment Stairs: Pt lives in the basement with no stairs Has following equipment at home: Vannie - 2 wheeled and shower chair  PLOF: Independent  PATIENT GOALS: To improve mobility and  strengthening  OBJECTIVE:  Note: Objective measures were completed at Evaluation unless otherwise noted.  HAND DOMINANCE: Right  ADLs: Overall ADLs: Pt has difficulty with all small object manipulation, including but not limited to buttons, shoe laces, and zippers. Additionally she has max difficulty with cooking and cleaning due to weakness in both hands and wrists.   MOBILITY STATUS: Hx of falls,  difficulty with turns, and difficulty carrying objections with ambulation  POSTURE COMMENTS:  rounded shoulders Sitting balance: Moves/returns truncal midpoint 1-2 inches in multiple planes  ACTIVITY TOLERANCE: Activity tolerance: Fair tolerance - fatigues quicker than normal  FUNCTIONAL OUTCOME MEASURES: Quick Dash: 47.73  UPPER EXTREMITY ROM:    All ROM is WFL  UPPER EXTREMITY MMT:     MMT Right eval Left eval  Shoulder flexion 5/5 5/5  Shoulder abduction 5/5 5/5  Shoulder internal rotation 5/5 5/5  Shoulder external rotation 5/5 5/5  Elbow flexion 5/5 5/5  Elbow extension 5/5 5/5  Wrist flexion 4/5 4+/5  Wrist extension 4-/5 4/5  Wrist ulnar deviation 4-/5 4/5  Wrist radial deviation 4-/5 4-/5  Wrist pronation 4/5 4+/5  Wrist supination 4/5 4/5  (Blank rows = not tested)  HAND FUNCTION: Grip strength: Right: 20 lbs; Left: 27 lbs, Lateral pinch: Right: 5 lbs, Left: 9 lbs, and 3 point pinch: Right: 5 lbs, Left: 4 lbs  COORDINATION: 9 Hole Peg test: Right: -- sec; Left: -- sec  SENSATION: Pt has full numbness along median nerve of R hand as well as dulled sensation and tingling in BUE up to elbows.   EDEMA: No swelling noted  OBSERVATIONS: adducted thumbs and 80% grip                                                                                                                             TREATMENT DATE:   06/04/24 -BP: 123/73, HR 66, O2 97% -Shoulder Strengthening: 1#, flexion, abduction, protraction, horizontal abduction, er/IR, x15 -X to V arms, x10 -Goal Post arms, x10 -Wrist ROM: 1#, extension, ulnar deviation, radial deviation, flexion, supination/pronation, x15 -Gripper: 11# BUE picking up 6 medium beads each  06/03/24 -Shoulder Strengthening: 1#, flexion, abduction, protraction, horizontal abduction, er/IR, x15 -Digit ROM: composite flexion, abduction, finger taps, opposition, x12 -Towel crumples x3 each hand - focusing on R hand crumpling finger  by finger to improve dexterity.  -Therabar: yellow, extension and flexion, supination/pronation bends, radial/ulnar bends, x15 each -Grooved peg board  05/29/24 -Digit ROM: composite flexion, abduction, finger taps, opposition, x12 -Towel crumples x3 each hand - focusing on R hand crumpling finger by finger to improve dexterity.  -Simulating washing L side of body with washcloth in the R hand -Wrist ROM: 2#, extension, ulnar deviation, radial deviation, flexion, supination/pronation, x10 -Checkers: RUE - flipping 10 checkers over, holding 2 at a time and placing in connect 4 board   PATIENT EDUCATION: Education details: continue HEP Person educated: Patient Education method: Explanation, Demonstration, and Handouts Education comprehension: verbalized understanding  and returned demonstration  HOME EXERCISE PROGRAM: 8/1: Wrist ROM and Digit ROM 8/8: Shoulder A/ROM   GOALS: Goals reviewed with patient? Yes  SHORT TERM GOALS: Target date: 06/19/24  Pt will be provided and educated on a comprehensive HEP for BUE mobility in order to complete ADL's and IADL's independently.   Goal status: IN PROGRESS  2.  Pt will improve BUE strength to 5/5 in order to lift and carry items during cooking and cleaning tasks.   Goal status: IN PROGRESS  3.  Pt will improve BUE grip by 15# and pinch by 3# in order to grasp and hold pots and pans.  Goal status: IN PROGRESS  4.  Pt will improve BUE coordination by completing 9 hole peg test in 45 or less in order to manipulate and complete fine motor tasks.   Goal status: IN PROGRESS   ASSESSMENT:  CLINICAL IMPRESSION: This session pt is reporting lightheadedness, however HR and BP are stable and normal. She had decreased energy this session, but was able to complete shoulder and wrist strengthening of BUE. Additionally, pt working on her grip strengthening, where she was able to tolerate 11lbs with both hands during the simulated functional  gripping tasks. OT providing verbal and tactile cuing for positioning and technique throughout session.    PERFORMANCE DEFICITS: in functional skills including ADLs, IADLs, coordination, dexterity, sensation, ROM, strength, pain, fascial restrictions, Fine motor control, Gross motor control, body mechanics, and UE functional use.    PLAN:  OT FREQUENCY: 2x/week  OT DURATION: 4 weeks  PLANNED INTERVENTIONS: 97168 OT Re-evaluation, 97535 self care/ADL training, 02889 therapeutic exercise, 97530 therapeutic activity, 97112 neuromuscular re-education, 97140 manual therapy, 97035 ultrasound, 97018 paraffin, 02989 moist heat, 97032 electrical stimulation (manual), passive range of motion, functional mobility training, energy conservation, coping strategies training, patient/family education, and DME and/or AE instructions  CONSULTED AND AGREED WITH PLAN OF CARE: Patient  PLAN FOR NEXT SESSION: Wrist and Digit ROM, strengthening, coordination tasks   Valentin Nightingale, OTR/L  [phone] 06/05/2024, 10:34 AM

## 2024-06-08 ENCOUNTER — Ambulatory Visit (HOSPITAL_COMMUNITY)
Admission: RE | Admit: 2024-06-08 | Discharge: 2024-06-08 | Disposition: A | Discharge status: Home / Self Care | Source: Ambulatory Visit | Attending: Obstetrics and Gynecology | Admitting: Obstetrics and Gynecology

## 2024-06-08 ENCOUNTER — Ambulatory Visit (HOSPITAL_COMMUNITY)
Admission: RE | Admit: 2024-06-08 | Discharge: 2024-06-08 | Disposition: A | Discharge status: Home / Self Care | Source: Ambulatory Visit | Attending: Family Medicine | Admitting: Family Medicine

## 2024-06-08 DIAGNOSIS — M85852 Other specified disorders of bone density and structure, left thigh: Secondary | ICD-10-CM | POA: Insufficient documentation

## 2024-06-08 DIAGNOSIS — Z13820 Encounter for screening for osteoporosis: Secondary | ICD-10-CM | POA: Insufficient documentation

## 2024-06-08 DIAGNOSIS — Z1231 Encounter for screening mammogram for malignant neoplasm of breast: Secondary | ICD-10-CM

## 2024-06-08 DIAGNOSIS — Z780 Asymptomatic menopausal state: Secondary | ICD-10-CM | POA: Insufficient documentation

## 2024-06-10 ENCOUNTER — Encounter (HOSPITAL_COMMUNITY): Payer: Self-pay | Admitting: Occupational Therapy

## 2024-06-10 ENCOUNTER — Ambulatory Visit (HOSPITAL_COMMUNITY): Admitting: Occupational Therapy

## 2024-06-10 ENCOUNTER — Ambulatory Visit: Payer: Self-pay | Admitting: Obstetrics and Gynecology

## 2024-06-10 DIAGNOSIS — R29818 Other symptoms and signs involving the nervous system: Secondary | ICD-10-CM

## 2024-06-10 DIAGNOSIS — R278 Other lack of coordination: Secondary | ICD-10-CM | POA: Diagnosis not present

## 2024-06-10 NOTE — Patient Instructions (Signed)

## 2024-06-10 NOTE — Therapy (Signed)
 OUTPATIENT OCCUPATIONAL THERAPY NEURO TREATMENT NOTE  Patient Name: Angela Norman MRN: [id number] DOB:Jul 26, 1956, 68 y.o., female Today's Date: 06/10/2024  PCP: Shona Norleen PEDLAR, MD REFERRING PROVIDER: Hillman Amour, MD  END OF SESSION:  OT End of Session - 06/10/24 1237     Visit Number 7    Number of Visits 9    Date for OT Re-Evaluation 06/19/24    Authorization Type Medicare Part A and B    OT Start Time 1119    OT Stop Time 1201    OT Time Calculation (min) 42 min    Activity Tolerance Patient tolerated treatment well    Behavior During Therapy WFL for tasks assessed/performed           Past Medical History:  Diagnosis Date   Anxiety    Arthritis    Basal cell carcinoma    Charcot's joint of foot    bilateral   Diabetes mellitus without complication (HCC)    Fibromyalgia    Neuropathy    Past Surgical History:  Procedure Laterality Date   ACHILLES TENDON REPAIR Left    bone spur Left    --left foot   BREAST SURGERY     benign breast bx--left--fatty tissue   CARPAL TUNNEL RELEASE Bilateral    COLONOSCOPY     COLONOSCOPY WITH PROPOFOL  N/A 09/06/2022   Procedure: COLONOSCOPY WITH PROPOFOL ;  Surgeon: Shaaron Lamar HERO, MD;  Location: [facility];  Service: Endoscopy;  Laterality: N/A;  10:30am, asa 2   DILATION AND CURETTAGE OF UTERUS  2011   for cervical polyp   FOOT SURGERY Left 03/2018   reconstructive surgery   POLYPECTOMY  09/06/2022   Procedure: POLYPECTOMY;  Surgeon: Shaaron Lamar HERO, MD;  Location: [facility];  Service: Endoscopy;;   Patient Active Problem List   Diagnosis Date Noted   Anxiety 06/28/2022   Arthritis 06/28/2022   Body fluid retention 06/28/2022   Hyperglycemia due to type 2 diabetes mellitus (HCC) 06/28/2022   Mixed hyperlipidemia 06/28/2022   Fibromyalgia 06/28/2022   Cough 10/25/2021   Musculoskeletal pain 05/21/2012   Stiffness of joint, not elsewhere classified, ankle and foot 08/13/2011   Difficulty walking  08/13/2011   Ankle weakness 08/13/2011    ONSET DATE: 08/2023  REFERRING DIAG:  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 Pt had multiple bouts of acute and inpatient rehab stays due to complications. Significant BP issues throughout this time. Had stay in multiple facilities. Pt reported various changes in functional status, was requiring assist for ADLs and mobility since April 28th. Since then, pt has been home with Home Health services. Pt continues to report numbness in BUE and poor grip strength. Pt reports neuropathy in BLE. Prior to onset of November '24 was walking daily for 45-60 minutes.   PRECAUTIONS: Fall  WEIGHT BEARING RESTRICTIONS: No  PAIN:  Are you having pain? Yes: NPRS scale: 2/10 Pain location: left arm Pain description: numbness/tingling Aggravating factors: rainy days Relieving factors: medication  FALLS: Has patient fallen in last 6 months? No  LIVING ENVIRONMENT: Lives with: lives with their spouse Lives in: House/apartment Stairs: Pt lives in the  basement with no stairs Has following equipment at home: Walker - 2 wheeled and shower chair  PLOF: Independent  PATIENT GOALS: To improve mobility and strengthening  OBJECTIVE:  Note: Objective measures were completed at Evaluation unless otherwise noted.  HAND DOMINANCE: Right  ADLs: Overall ADLs: Pt has difficulty with all small object manipulation, including but not limited to buttons, shoe laces, and zippers. Additionally she has max difficulty with cooking and cleaning due to weakness in both hands and wrists.    MOBILITY STATUS: Hx of falls, difficulty with turns, and difficulty carrying objections with ambulation  POSTURE COMMENTS:  rounded shoulders Sitting balance: Moves/returns truncal midpoint 1-2 inches in multiple planes  ACTIVITY TOLERANCE: Activity tolerance: Fair tolerance - fatigues quicker than normal  FUNCTIONAL OUTCOME MEASURES: Quick Dash: 47.73  UPPER EXTREMITY ROM:    All ROM is WFL  UPPER EXTREMITY MMT:     MMT Right eval Left eval  Shoulder flexion 5/5 5/5  Shoulder abduction 5/5 5/5  Shoulder internal rotation 5/5 5/5  Shoulder external rotation 5/5 5/5  Elbow flexion 5/5 5/5  Elbow extension 5/5 5/5  Wrist flexion 4/5 4+/5  Wrist extension 4-/5 4/5  Wrist ulnar deviation 4-/5 4/5  Wrist radial deviation 4-/5 4-/5  Wrist pronation 4/5 4+/5  Wrist supination 4/5 4/5  (Blank rows = not tested)  HAND FUNCTION: Grip strength: Right: 20 lbs; Left: 27 lbs, Lateral pinch: Right: 5 lbs, Left: 9 lbs, and 3 point pinch: Right: 5 lbs, Left: 4 lbs  COORDINATION: 9 Hole Peg test: Right: -- sec; Left: -- sec  SENSATION: Pt has full numbness along median nerve of R hand as well as dulled sensation and tingling in BUE up to elbows.   EDEMA: No swelling noted  OBSERVATIONS: adducted thumbs and 80% grip                                                                                                                             TREATMENT DATE:   06/10/24 -Shoulder shrugs, 2#, x12 -Strengthening: 2#, bicep curls, pronated curls, x12 -Shoulder Strengthening: 1#, flexion, abduction, protraction, horizontal abduction, er/IR, x15 -X to V arms, x10 -Goal Post arms, x10 -Scapular Strengthening: red band, extension, retraction, rows, x12 -Digit ROM: composite flexion, finger taps, opposition, x10  06/04/24 -BP: 123/73, HR 66, O2 97% -Shoulder Strengthening: 1#, flexion, abduction, protraction, horizontal abduction, er/IR, x15 -X to V arms, x10 -Goal Post arms,  x10 -Wrist ROM: 1#, extension, ulnar deviation, radial deviation, flexion, supination/pronation, x15 -Gripper: 11# BUE picking up 6 medium beads each  06/03/24 -Shoulder Strengthening: 1#, flexion, abduction, protraction, horizontal abduction, er/IR, x15 -Digit ROM: composite flexion, abduction, finger taps, opposition, x12 -Towel crumples x3 each hand - focusing on R hand crumpling finger by finger to improve dexterity.  -Therabar: yellow, extension and flexion, supination/pronation bends, radial/ulnar bends, x15 each -Grooved peg board   PATIENT EDUCATION: Education details: Publishing rights manager Person educated: Patient Education method: Explanation, Demonstration, and Handouts  Education comprehension: verbalized understanding and returned demonstration  HOME EXERCISE PROGRAM: 8/1: Wrist ROM and Digit ROM 8/8: Shoulder A/ROM 8/20: Scapular Strengthening   GOALS: Goals reviewed with patient? Yes  SHORT TERM GOALS: Target date: 06/19/24  Pt will be provided and educated on a comprehensive HEP for BUE mobility in order to complete ADL's and IADL's independently.   Goal status: IN PROGRESS  2.  Pt will improve BUE strength to 5/5 in order to lift and carry items during cooking and cleaning tasks.   Goal status: IN PROGRESS  3.  Pt will improve BUE grip by 15# and pinch by 3# in order to grasp and hold pots and pans.  Goal status: IN PROGRESS  4.  Pt will improve BUE coordination by completing 9 hole peg test in 45 or less in order to manipulate and complete fine motor tasks.   Goal status: IN PROGRESS   ASSESSMENT:  CLINICAL IMPRESSION:  Pt presenting to therapy with improved pain and energy. She worked on Print production planner and mobility throughout the entire session. Her overall strength and coordination are improving and pt feels that she is getting better. OT providing verbal and tactile cuing for positioning and technique throughout session.   PERFORMANCE DEFICITS:  in functional skills including ADLs, IADLs, coordination, dexterity, sensation, ROM, strength, pain, fascial restrictions, Fine motor control, Gross motor control, body mechanics, and UE functional use.    PLAN:  OT FREQUENCY: 2x/week  OT DURATION: 4 weeks  PLANNED INTERVENTIONS: 97168 OT Re-evaluation, 97535 self care/ADL training, 02889 therapeutic exercise, 97530 therapeutic activity, 97112 neuromuscular re-education, 97140 manual therapy, 97035 ultrasound, 97018 paraffin, 02989 moist heat, 97032 electrical stimulation (manual), passive range of motion, functional mobility training, energy conservation, coping strategies training, patient/family education, and DME and/or AE instructions  CONSULTED AND AGREED WITH PLAN OF CARE: Patient  PLAN FOR NEXT SESSION: Wrist and Digit ROM, strengthening, coordination tasks   Valentin Nightingale, OTR/L  [phone] 06/10/2024, 12:39 PM

## 2024-06-11 ENCOUNTER — Ambulatory Visit (HOSPITAL_COMMUNITY)

## 2024-06-11 ENCOUNTER — Encounter (HOSPITAL_COMMUNITY): Payer: Self-pay

## 2024-06-11 DIAGNOSIS — M6281 Muscle weakness (generalized): Secondary | ICD-10-CM

## 2024-06-11 DIAGNOSIS — R278 Other lack of coordination: Secondary | ICD-10-CM | POA: Diagnosis not present

## 2024-06-11 DIAGNOSIS — G9520 Unspecified cord compression: Secondary | ICD-10-CM

## 2024-06-11 DIAGNOSIS — Z7409 Other reduced mobility: Secondary | ICD-10-CM

## 2024-06-11 NOTE — Therapy (Signed)
 OUTPATIENT PHYSICAL THERAPY NEURO TREATMENT   Patient Name: Angela Norman MRN: [id number] DOB:Oct 31, 1955, 68 y.o., female Today's Date: 06/11/2024   PCP: Sebastian Othel GAILS, FNP REFERRING PROVIDER: Edna Norris, MD  END OF SESSION:  PT End of Session - 06/11/24 1016     Visit Number 8    Number of Visits 16    Date for PT Re-Evaluation 06/22/24    Authorization Type Medicare A & B    PT Start Time 1018    PT Stop Time 1100    PT Time Calculation (min) 42 min    Equipment Utilized During Treatment Gait belt    Activity Tolerance Patient tolerated treatment well    Behavior During Therapy WFL for tasks assessed/performed             Past Medical History:  Diagnosis Date   Anxiety    Arthritis    Basal cell carcinoma    Charcot's joint of foot    bilateral   Diabetes mellitus without complication (HCC)    Fibromyalgia    Neuropathy    Past Surgical History:  Procedure Laterality Date   ACHILLES TENDON REPAIR Left    bone spur Left    --left foot   BREAST SURGERY     benign breast bx--left--fatty tissue   CARPAL TUNNEL RELEASE Bilateral    COLONOSCOPY     COLONOSCOPY WITH PROPOFOL  N/A 09/06/2022   Procedure: COLONOSCOPY WITH PROPOFOL ;  Surgeon: Shaaron Lamar HERO, MD;  Location: [facility];  Service: Endoscopy;  Laterality: N/A;  10:30am, asa 2   DILATION AND CURETTAGE OF UTERUS  2011   for cervical polyp   FOOT SURGERY Left 03/2018   reconstructive surgery   POLYPECTOMY  09/06/2022   Procedure: POLYPECTOMY;  Surgeon: Shaaron Lamar HERO, MD;  Location: [facility];  Service: Endoscopy;;   Patient Active Problem List   Diagnosis Date Noted   Anxiety 06/28/2022   Arthritis 06/28/2022   Body fluid retention 06/28/2022   Hyperglycemia due to type 2 diabetes mellitus (HCC) 06/28/2022   Mixed hyperlipidemia 06/28/2022   Fibromyalgia 06/28/2022   Cough 10/25/2021   Musculoskeletal pain 05/21/2012   Stiffness of joint, not elsewhere classified,  ankle and foot 08/13/2011   Difficulty walking 08/13/2011   Ankle weakness 08/13/2011    ONSET DATE: November 2024  REFERRING DIAG: compression of spinal cord   THERAPY DIAG:  Compression of spinal cord (HCC)  Impaired functional mobility, balance, gait, and endurance  Muscle weakness (generalized)  Rationale for Evaluation and Treatment: Rehabilitation  SUBJECTIVE:  SUBJECTIVE STATEMENT:  No reports of pain, continues to have numbness and tingling in arms and legs.  Brought compressoin garments with her today, has not tried them on yet.     Eval:  Pt reported all this started with UTI that caused fracture f neck and indicated as C4-C7 fusion. Pt had multiple bouts of acute and inpatient rehab stays due to complications. Significant BP issues throughout this time. Had stay in multiple facilities. Pt reported various changes in functional status, was requiring assist for ADLs and mobility since April 28th. Since then, pt has been home with Home Health services. Pt continues to report numbness in BUE and poor grip strength. Pt reports neuropathy in BLE. Prior to onset of November '24 was walking daily for 45-60 minutes.   Pt has CT scan for nodule on Lung on 04/28/24.  Pt accompanied by: self  PERTINENT HISTORY:  Cervical Myelopathy with ACDF 4-7 in November 2024\ Left ankle surgery x 3  PAIN:  Are you having pain? No and chronic pain in left shoulder  PRECAUTIONS: None  RED FLAGS: Bowel or bladder incontinence: Yes: SCI   WEIGHT BEARING RESTRICTIONS: No  FALLS: Has patient fallen in last 6 months? Yes. Number of falls 1-2   PATIENT GOALS:  want to walk with AD well and with confidence and strengthening RUE.  OBJECTIVE:  Note: Objective measures were completed at Evaluation unless  otherwise noted.  DIAGNOSTIC FINDINGS:   COGNITION: Overall cognitive status: Within functional limits for tasks assessed   SENSATION: Light touch: decreased light touch from lateral malleolus distally.   LOWER EXTREMITY ROM:     Active  Right Eval Left Eval  Hip flexion    Hip extension    Hip abduction    Hip adduction    Hip internal rotation    Hip external rotation    Knee flexion    Knee extension    Ankle dorsiflexion    Ankle plantarflexion    Ankle inversion    Ankle eversion     (Blank rows = not tested)  LOWER EXTREMITY MMT:    MMT Right Eval Left Eval  Hip flexion    Hip extension    Hip abduction    Hip adduction    Hip internal rotation    Hip external rotation    Knee flexion    Knee extension 4- 4+  Ankle dorsiflexion    Ankle plantarflexion    Ankle inversion    Ankle eversion    (Blank rows = not tested)   TRANSFERS: Sit to stand: Modified independence  Assistive device utilized: Environmental consultant - 2 wheeled     Stand to sit: Modified independence  Assistive device utilized: Environmental consultant - 2 wheeled     Chair to chair: SBA  Assistive device utilized: Environmental consultant - 2 wheeled       GAIT: Findings: Gait Characteristics: step through pattern, decreased arm swing- Right, decreased arm swing- Left, decreased step length- Right, decreased step length- Left, decreased hip/knee flexion- Right, decreased hip/knee flexion- Left, Right foot flat, Left foot flat, and narrow BOS, Distance walked: 26ft, Assistive device utilized:Walker - 2 wheeled, Level of assistance: Modified independence, and Comments:    FUNCTIONAL TESTS:  TUG: 25.71 seconds  DGI: 05/13/24: 8 / 24  L SLS: <0.1 seconds  R SLS: <0.1 seconds  Norms: 18-39  F: 43.5 seconds  M: 43.2 seconds 40-49  F: 40.4 seconds  M: 40.1 seconds 50-59  F: 36 seconds  M: 38.1 seconds 60-69  F: 25.1 seconds  M: 28.7 seconds 70-79  F: 11.3 seconds  M: 18.3 seconds  PATIENT SURVEYS:  ABC Scale:950 / 1600 =  59.4 %                                                                                                                              TREATMENT DATE:  06/11/24: Nustep seat 9 x 5' dynamic warm up [employer] L5 UE/LE Educated techniques for donning compression garments, difficulty due to poor grasp  and tightness of compression sock, shown butler as option.  Reports increased Rt foot tenderness with socks on Leg press 10x 2 3Pl Toe tapping 4# on ankles alternating 6in step 2x 10 BUE--> 1 HHA 6in hurdels 4# forward and lateral 2RT with HHA  06/01/24 Nustep seat 9 x 5' dynamic warm up level 2 Sit to stand from standard chair with yellow med ball 2 x 10 4# hip abduction 2 x 10 4# hip extension 2 x 10 4# on legs stepping over small hurdles x 3 in // bars down and back x 4 with 1 UE assist Seated 4# LAQ's 2 x 10  05/27/24:   Nustep UE/LE [place] x 5' Seated: -Hip flexion then abduction to simulate getting in and out of vehicle over 6in hurdle Standing: -Hip flexion then abduction to simulate getting in and out of vehicle over 6in hurdle -STS 2x 10 first with red then yellow weight ball -Walking marches/butt kicks, 3 laps, 20 foot line, 4lb ankle weights -Sidestep with 4# on ankle, RTB on thigh 2RT front of mat -Reciprocal pattern 7in step height reciprocal ascend, step to descending, heavy UE support 1RT -Tandem Stance 2x 30    05/25/24 Therapeutic Exercise: -Standing hip abd and ext; 2 sets 10 reps w/ 3# , bilaterally, pt cued for upright trunk and maintaining of neutral spine  Therapeutic Activity: -Sit to stands from mat table - 10 reps without UE A; pt cued for core activation - Sit to stand + step fwd/bwd onto airex without UE A 3 out of 10 reps (single UE A needed for 7 reps) - Weighted side stepping without UE A and with cues for maintaining  -Step up and back, 2 set of 5 reps bilaterally, pt requires BUE support -Walking marches/butt kicks, 3 laps, 20 foot line, 3lb ankle  weights -Lateral step up and overs, 1 set of 5 reps, bilaterally, pt requires BUE support  05/21/2024  Therapeutic Exercise: -Step up and overs, 1 set of 5 reps bilaterally, pt requires BUE support -Lateral step up and overs, 1 set of 5 reps, bilaterally, pt requires BUE support -Standing 3 way hip 2 sets 7 reps, bilaterally, pt cued for upright trunk and maintaining of neutral spine -Forward lunges on bosu ball, BUE support, 1 set of 5 reps better performance going into LLE, pt cued for core activation and upright posture -Walking marches/butt kicks, 3 laps, 20 foot line, 3lb ankle weights  Therapeutic Activity: -Sit to stands with shoulder flexion with 5lb bar, 2 sets of 8 reps, pt cued for core activation -Step up and eccentric lowers on bosu ball, 1 set of 4 reps bilaterally, pt requires BUE support   05/19/24: BP 130/66 mmHg HR 83 bpm  STS 10x no HHA Toe tapping 6in step 2x 10 alternating 2 HHA 1st set, 1 HHA Sidestep with RTB around thigh 3RT Tandem stance with 1 HHA 2x 30 Tandem gait 2RT inside // bars Hamstring curls 10x each SLS with 1 heavy UE support x 30 (reports of LBP following this exercise)  Supine: Decompression 5x 5  PATIENT EDUCATION: Education details: PT Evaluation, findings, prognosis, frequency, attendance policy. Person educated: Patient Education method: Explanation and Demonstration Education comprehension: verbalized understanding  HOME EXERCISE PROGRAM: Access Code: [email or web address] URL: [email or web address] Date: 05/14/2024 Prepared by: Augustin Mclean  Exercises - Sit to Stand  - 1 x daily - 7 x weekly - 3 sets - 10 reps - Standing March with Counter Support  - 1 x daily - 7 x weekly - 3 sets - 10 reps - Standing Hip Abduction with Counter Support  - 1 x daily - 7 x weekly - 3 sets - 10 reps - Side Stepping with Resistance at Thighs and Counter Support  - 1 x daily - 7 x weekly - 3 sets - 10 reps  GOALS: Goals reviewed with  patient? No  SHORT TERM GOALS: Target date: 05/25/2024  Pt will be independent with HEP in order to demonstrate participation in Physical Therapy POC.  Baseline: Goal status: INITIAL  2.  Pt will improve ABC score by 15% in order to demonstrate improved pain with functional goals and outcomes. Baseline:  Goal status: INITIAL  LONG TERM GOALS: Target date: 06/22/2024  Pt will increase DGI score by > MCID in order to demonstrate improved functional safety and balance skills in ADL/mobility.   Baseline: see objective.  Goal status: INITIAL  2.  Pt will improve TUG by least 3 seconds in order to demonstrate improved functional mobility capacity in community setting.  Baseline: see objective.  Goal status: INITIAL  3.  Pt will improve ABC score by 30% in order to demonstrate improved pain with functional goals and outcomes. Baseline: see objective.  Goal status: INITIAL  4.  Pt will improve single leg balance by at least 3 seconds bilaterally in order to demonstrate improved capacity, safety, and overall quality of movement to optimize function.  Baseline: see objective.  Goal status: INITIAL  ASSESSMENT:  CLINICAL IMPRESSION: Pt brought in compression garments this session.  Pt educated on techniques to donn compression garments.  Pt presents with increased difficulty with hand grasp strength and reports of increased tenderness on dorsal aspect of foot when donned.  Pt shown butler to assist with donning and suggested husband assist with donning as well.  Pt reports edema has reduced since therapist suggested this garment.  Verbalized understanding.  Session focus with LE strengthening and balance.  Added leg press for posterior chain strengthening with some difficulty getting into position.  Hurdles complete forward and lateral with HHA required to maintain balance.  No reports of pain at EOS, limited by appropriate levels of fatigue.    Eval:  Patient is a 68y.o. female who was seen  today for physical therapy evaluation and treatment for 'compression of spinal cord.'  Patient with history of myelopathy C5 spinal fracture, with A C4 through C7 cervical fusion.  Patient presents with significant functional mobility  deficits, safety concerns, increased falls risk, reduced ambulation capacity at due to muscle weakness, decreased activity tolerance, balance deficits in setting of previous cervical myelopathy pt will benefit from skilled Physical Therapy services to address deficits/limitations in order to improve functional and QOL.    OBJECTIVE IMPAIRMENTS: Abnormal gait, decreased activity tolerance, decreased balance, decreased mobility, difficulty walking, decreased strength, and postural dysfunction.   ACTIVITY LIMITATIONS: carrying, lifting, bending, standing, squatting, stairs, transfers, bed mobility, and locomotion level  PARTICIPATION LIMITATIONS: meal prep, cleaning, laundry, community activity, occupation, and yard work  PERSONAL FACTORS: Age are also affecting patient's functional outcome.   REHAB POTENTIAL: Excellent  CLINICAL DECISION MAKING: Stable/uncomplicated  EVALUATION COMPLEXITY: Low  PLAN:  PT FREQUENCY: 2x/week  PT DURATION: 8 weeks  PLANNED INTERVENTIONS: 97164- PT Re-evaluation, 97750- Physical Performance Testing, 97110-Therapeutic exercises, 97530- Therapeutic activity, [id number]- Neuromuscular re-education, 97535- Self Care, 02859- Manual therapy, [phone]- Gait training, [phone]- Orthotic Initial, Patient/Family education, Balance training, Stair training, Taping, Joint mobilization, Joint manipulation, Spinal manipulation, Spinal mobilization, Cryotherapy, and Moist heat  PLAN FOR NEXT SESSION: Functional mobility, strengthening, ambulation, etc. Review donning compression garments.  11:38 AM, 06/11/24

## 2024-06-12 ENCOUNTER — Ambulatory Visit (HOSPITAL_COMMUNITY): Admitting: Occupational Therapy

## 2024-06-12 ENCOUNTER — Encounter (HOSPITAL_COMMUNITY): Payer: Self-pay | Admitting: Occupational Therapy

## 2024-06-12 DIAGNOSIS — R278 Other lack of coordination: Secondary | ICD-10-CM | POA: Diagnosis not present

## 2024-06-12 DIAGNOSIS — R29818 Other symptoms and signs involving the nervous system: Secondary | ICD-10-CM

## 2024-06-12 NOTE — Therapy (Signed)
 OUTPATIENT OCCUPATIONAL THERAPY NEURO TREATMENT NOTE  Patient Name: Angela Norman MRN: [id number] DOB:1956/01/16, 68 y.o., female Today's Date: 06/12/2024  PCP: Shona Norleen PEDLAR, MD REFERRING PROVIDER: Hillman Amour, MD  END OF SESSION:   06/12/24 1206  OT Visits / Re-Eval  Visit Number 8  Number of Visits 9  Date for OT Re-Evaluation 06/19/24  Authorization  Authorization Type Medicare Part A and B  OT Time Calculation  OT Start Time 1125  OT Stop Time 1206  OT Time Calculation (min) 41 min  End of Session  Activity Tolerance Patient tolerated treatment well  Behavior During Therapy WFL for tasks assessed/performed    Past Medical History:  Diagnosis Date   Anxiety    Arthritis    Basal cell carcinoma    Charcot's joint of foot    bilateral   Diabetes mellitus without complication (HCC)    Fibromyalgia    Neuropathy    Past Surgical History:  Procedure Laterality Date   ACHILLES TENDON REPAIR Left    bone spur Left    --left foot   BREAST SURGERY     benign breast bx--left--fatty tissue   CARPAL TUNNEL RELEASE Bilateral    COLONOSCOPY     COLONOSCOPY WITH PROPOFOL  N/A 09/06/2022   Procedure: COLONOSCOPY WITH PROPOFOL ;  Surgeon: Shaaron Lamar HERO, MD;  Location: [facility];  Service: Endoscopy;  Laterality: N/A;  10:30am, asa 2   DILATION AND CURETTAGE OF UTERUS  2011   for cervical polyp   FOOT SURGERY Left 03/2018   reconstructive surgery   POLYPECTOMY  09/06/2022   Procedure: POLYPECTOMY;  Surgeon: Shaaron Lamar HERO, MD;  Location: [facility];  Service: Endoscopy;;   Patient Active Problem List   Diagnosis Date Noted   Anxiety 06/28/2022   Arthritis 06/28/2022   Body fluid retention 06/28/2022   Hyperglycemia due to type 2 diabetes mellitus (HCC) 06/28/2022   Mixed hyperlipidemia 06/28/2022   Fibromyalgia 06/28/2022   Cough 10/25/2021   Musculoskeletal pain 05/21/2012   Stiffness of joint, not elsewhere classified, ankle and foot  08/13/2011   Difficulty walking 08/13/2011   Ankle weakness 08/13/2011    ONSET DATE: 08/2023  REFERRING DIAG:  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  Rationale for Evaluation and Treatment: Rehabilitation  SUBJECTIVE:   SUBJECTIVE STATEMENT: S: Things are going ok today  PERTINENT HISTORY: Pt reported all this started with UTI that caused fracture f neck and indicated as C4-C7 fusion. Pt had multiple bouts of acute and inpatient rehab stays due to complications. Significant BP issues throughout this time. Had stay in multiple facilities. Pt reported various changes in functional status, was requiring assist for ADLs and mobility since April 28th. Since then, pt has been home with Home Health services. Pt continues to report numbness in BUE and poor grip strength. Pt reports neuropathy in BLE. Prior to onset of November '24 was walking daily for 45-60 minutes.   PRECAUTIONS: Fall  WEIGHT BEARING RESTRICTIONS: No  PAIN:  Are you having pain? Yes: NPRS scale: 2/10 Pain location: left arm Pain description: numbness/tingling Aggravating factors: rainy days Relieving factors: medication  FALLS: Has patient fallen in last 6 months? No  LIVING ENVIRONMENT: Lives with: lives with their spouse Lives in: House/apartment Stairs: Pt lives in the basement with no stairs Has following equipment at home: Vannie - 2 wheeled and shower chair  PLOF: Independent  PATIENT GOALS: To improve mobility  and strengthening  OBJECTIVE:  Note: Objective measures were completed at Evaluation unless otherwise noted.  HAND DOMINANCE: Right  ADLs: Overall ADLs: Pt has difficulty with all small object manipulation, including but not limited to buttons, shoe laces, and zippers. Additionally she has max difficulty with cooking and cleaning due to weakness in both hands and wrists.   MOBILITY STATUS: Hx of falls,  difficulty with turns, and difficulty carrying objections with ambulation  POSTURE COMMENTS:  rounded shoulders Sitting balance: Moves/returns truncal midpoint 1-2 inches in multiple planes  ACTIVITY TOLERANCE: Activity tolerance: Fair tolerance - fatigues quicker than normal  FUNCTIONAL OUTCOME MEASURES: Quick Dash: 47.73  UPPER EXTREMITY ROM:    All ROM is WFL  UPPER EXTREMITY MMT:     MMT Right eval Left eval  Shoulder flexion 5/5 5/5  Shoulder abduction 5/5 5/5  Shoulder internal rotation 5/5 5/5  Shoulder external rotation 5/5 5/5  Elbow flexion 5/5 5/5  Elbow extension 5/5 5/5  Wrist flexion 4/5 4+/5  Wrist extension 4-/5 4/5  Wrist ulnar deviation 4-/5 4/5  Wrist radial deviation 4-/5 4-/5  Wrist pronation 4/5 4+/5  Wrist supination 4/5 4/5  (Blank rows = not tested)  HAND FUNCTION: Grip strength: Right: 20 lbs; Left: 27 lbs, Lateral pinch: Right: 5 lbs, Left: 9 lbs, and 3 point pinch: Right: 5 lbs, Left: 4 lbs  COORDINATION: 9 Hole Peg test: Right: -- sec; Left: -- sec  SENSATION: Pt has full numbness along median nerve of R hand as well as dulled sensation and tingling in BUE up to elbows.   EDEMA: No swelling noted  OBSERVATIONS: adducted thumbs and 80% grip                                                                                                                             TREATMENT DATE:   06/12/24 -Theraputty: red putty, roll into ball, flatten into pancake, roll into log, tripod pinch, lateral pinch, roll into ball, squeeze -Therabar: yellow, extension/flexion twists, ulnar/radial deviation, supination/pronation, x10 -Large peg board w/pattern -Gripper: 15#, 11#, picking up 10 medium beads each  06/10/24 -Shoulder shrugs, 2#, x12 -Strengthening: 2#, bicep curls, pronated curls, x12 -Shoulder Strengthening: 1#, flexion, abduction, protraction, horizontal abduction, er/IR, x15 -X to V arms, x10 -Goal Post arms, x10 -Scapular  Strengthening: red band, extension, retraction, rows, x12 -Digit ROM: composite flexion, finger taps, opposition, x10  06/04/24 -BP: 123/73, HR 66, O2 97% -Shoulder Strengthening: 1#, flexion, abduction, protraction, horizontal abduction, er/IR, x15 -X to V arms, x10 -Goal Post arms, x10 -Wrist ROM: 1#, extension, ulnar deviation, radial deviation, flexion, supination/pronation, x15 -Gripper: 11# BUE picking up 6 medium beads each   PATIENT EDUCATION: Education details: Publishing rights manager Person educated: Patient Education method: Programmer, multimedia, Demonstration, and Handouts Education comprehension: verbalized understanding and returned demonstration  HOME EXERCISE PROGRAM: 8/1: Wrist ROM and Digit ROM 8/8: Shoulder A/ROM 8/20: Scapular Strengthening   GOALS: Goals reviewed with patient? Yes  SHORT TERM GOALS:  Target date: 06/19/24  Pt will be provided and educated on a comprehensive HEP for BUE mobility in order to complete ADL's and IADL's independently.   Goal status: IN PROGRESS  2.  Pt will improve BUE strength to 5/5 in order to lift and carry items during cooking and cleaning tasks.   Goal status: IN PROGRESS  3.  Pt will improve BUE grip by 15# and pinch by 3# in order to grasp and hold pots and pans.  Goal status: IN PROGRESS  4.  Pt will improve BUE coordination by completing 9 hole peg test in 45 or less in order to manipulate and complete fine motor tasks.   Goal status: IN PROGRESS   ASSESSMENT:  CLINICAL IMPRESSION:  This session focused on hand strength and coordination. She demonstrated poor grip and wrist strength with both the theraputty and therabar, requiring increased time and rest breaks. Additionally, pt having moderate difficulty placing large pegs in peg board, at times requiring both hands to push them all the way down. OT providing verbal and tactile cuing for positioning and technique throughout session.   PERFORMANCE DEFICITS: in  functional skills including ADLs, IADLs, coordination, dexterity, sensation, ROM, strength, pain, fascial restrictions, Fine motor control, Gross motor control, body mechanics, and UE functional use.    PLAN:  OT FREQUENCY: 2x/week  OT DURATION: 4 weeks  PLANNED INTERVENTIONS: 97168 OT Re-evaluation, 97535 self care/ADL training, 02889 therapeutic exercise, 97530 therapeutic activity, 97112 neuromuscular re-education, 97140 manual therapy, 97035 ultrasound, 97018 paraffin, 02989 moist heat, 97032 electrical stimulation (manual), passive range of motion, functional mobility training, energy conservation, coping strategies training, patient/family education, and DME and/or AE instructions  CONSULTED AND AGREED WITH PLAN OF CARE: Patient  PLAN FOR NEXT SESSION: Wrist and Digit ROM, strengthening, coordination tasks   Valentin Nightingale, OTR/L  [phone] 06/12/2024, 11:31 AM

## 2024-06-16 ENCOUNTER — Ambulatory Visit (HOSPITAL_COMMUNITY): Admitting: Occupational Therapy

## 2024-06-16 ENCOUNTER — Ambulatory Visit (HOSPITAL_COMMUNITY)

## 2024-06-16 ENCOUNTER — Encounter (HOSPITAL_COMMUNITY): Payer: Self-pay | Admitting: Occupational Therapy

## 2024-06-16 DIAGNOSIS — Z7409 Other reduced mobility: Secondary | ICD-10-CM

## 2024-06-16 DIAGNOSIS — M6281 Muscle weakness (generalized): Secondary | ICD-10-CM

## 2024-06-16 DIAGNOSIS — R278 Other lack of coordination: Secondary | ICD-10-CM

## 2024-06-16 DIAGNOSIS — G9520 Unspecified cord compression: Secondary | ICD-10-CM

## 2024-06-16 DIAGNOSIS — R29818 Other symptoms and signs involving the nervous system: Secondary | ICD-10-CM

## 2024-06-16 NOTE — Therapy (Unsigned)
 OUTPATIENT OCCUPATIONAL THERAPY NEURO TREATMENT NOTE  Patient Name: Angela Norman MRN: [id number] DOB:1956-02-09, 68 y.o., female Today's Date: 06/17/2024  Progress Note Reporting Period 05/19/24 to 06/16/24  See note below for Objective Data and Assessment of Progress/Goals.    PCP: Shona Norleen PEDLAR, MD REFERRING PROVIDER: Hillman Amour, MD  END OF SESSION:  OT End of Session - 06/16/24 1609     Visit Number 9    Number of Visits 17    Date for OT Re-Evaluation 07/17/24    Authorization Type Medicare Part A and B    OT Start Time 1524    OT Stop Time 1609    OT Time Calculation (min) 45 min    Activity Tolerance Patient tolerated treatment well    Behavior During Therapy WFL for tasks assessed/performed          Past Medical History:  Diagnosis Date   Anxiety    Arthritis    Basal cell carcinoma    Charcot's joint of foot    bilateral   Diabetes mellitus without complication (HCC)    Fibromyalgia    Neuropathy    Past Surgical History:  Procedure Laterality Date   ACHILLES TENDON REPAIR Left    bone spur Left    --left foot   BREAST SURGERY     benign breast bx--left--fatty tissue   CARPAL TUNNEL RELEASE Bilateral    COLONOSCOPY     COLONOSCOPY WITH PROPOFOL  N/A 09/06/2022   Procedure: COLONOSCOPY WITH PROPOFOL ;  Surgeon: Shaaron Lamar HERO, MD;  Location: [facility];  Service: Endoscopy;  Laterality: N/A;  10:30am, asa 2   DILATION AND CURETTAGE OF UTERUS  2011   for cervical polyp   FOOT SURGERY Left 03/2018   reconstructive surgery   POLYPECTOMY  09/06/2022   Procedure: POLYPECTOMY;  Surgeon: Shaaron Lamar HERO, MD;  Location: [facility];  Service: Endoscopy;;   Patient Active Problem List   Diagnosis Date Noted   Anxiety 06/28/2022   Arthritis 06/28/2022   Body fluid retention 06/28/2022   Hyperglycemia due to type 2 diabetes mellitus (HCC) 06/28/2022   Mixed hyperlipidemia 06/28/2022   Fibromyalgia 06/28/2022   Cough 10/25/2021    Musculoskeletal pain 05/21/2012   Stiffness of joint, not elsewhere classified, ankle and foot 08/13/2011   Difficulty walking 08/13/2011   Ankle weakness 08/13/2011    ONSET DATE: 08/2023  REFERRING DIAG:  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 Pt had multiple bouts of acute and inpatient rehab stays due to complications. Significant BP issues throughout this time. Had stay in multiple facilities. Pt reported various changes in functional status, was requiring assist for ADLs and mobility since April 28th. Since then, pt has been home with Home Health services. Pt continues to report numbness in BUE and poor grip strength. Pt reports neuropathy in BLE. Prior to onset of November '24 was walking daily for 45-60 minutes.   PRECAUTIONS: Fall  WEIGHT BEARING RESTRICTIONS: No  PAIN:  Are you having pain? Yes: NPRS scale: 2/10 Pain location: left arm Pain description: numbness/tingling Aggravating factors: rainy days Relieving factors: medication  FALLS: Has patient fallen in last  6 months? No  LIVING ENVIRONMENT: Lives with: lives with their spouse Lives in: House/apartment Stairs: Pt lives in the basement with no stairs Has following equipment at home: Vannie - 2 wheeled and shower chair  PLOF: Independent  PATIENT GOALS: To improve mobility and strengthening  OBJECTIVE:  Note: Objective measures were completed at Evaluation unless otherwise noted.  HAND DOMINANCE: Right  ADLs: Overall ADLs: Pt has difficulty with all small object manipulation, including but not limited to buttons, shoe  laces, and zippers. Additionally she has max difficulty with cooking and cleaning due to weakness in both hands and wrists.   MOBILITY STATUS: Hx of falls, difficulty with turns, and difficulty carrying objections with ambulation  POSTURE COMMENTS:  rounded shoulders Sitting balance: Moves/returns truncal midpoint 1-2 inches in multiple planes  ACTIVITY TOLERANCE: Activity tolerance: Fair tolerance - fatigues quicker than normal  FUNCTIONAL OUTCOME MEASURES: Quick Dash: 47.73  UPPER EXTREMITY ROM:    All ROM is WFL  UPPER EXTREMITY MMT:     MMT Right eval Left eval  Shoulder flexion 5/5 5/5  Shoulder abduction 5/5 5/5  Shoulder internal rotation 5/5 5/5  Shoulder external rotation 5/5 5/5  Elbow flexion 5/5 5/5  Elbow extension 5/5 5/5  Wrist flexion 4/5 4+/5  Wrist extension 4-/5 4/5  Wrist ulnar deviation 4-/5 4/5  Wrist radial deviation 4-/5 4-/5  Wrist pronation 4/5 4+/5  Wrist supination 4/5 4/5  (Blank rows = not tested)  HAND FUNCTION: Grip strength: Right: 20 lbs; Left: 27 lbs, Lateral pinch: Right: 5 lbs, Left: 9 lbs, and 3 point pinch: Right: 5 lbs, Left: 4 lbs  COORDINATION: 9 Hole Peg test: Right: -- sec; Left: -- sec  SENSATION: Pt has full numbness along median nerve of R hand as well as dulled sensation and tingling in BUE up to elbows.   EDEMA: No swelling noted  OBSERVATIONS: adducted thumbs and 80% grip                                                                                                                             TREATMENT DATE:   06/16/24 -Shoulder Strengthening: 1#, flexion, abduction, protraction, horizontal abduction, er/IR, x15 -Proximal Shoulder Exercises: paddles, criss cross, circles both directions, x10 -Strengthening: 2#, bicep curls, pronated curls, x12 -Grooved peg board, 10 pegs each hand - increased time  06/12/24 -Theraputty: red putty, roll into ball, flatten into pancake, roll into log, tripod pinch, lateral  pinch, roll into ball, squeeze -Therabar: yellow, extension/flexion twists, ulnar/radial deviation, supination/pronation, x10 -Large peg board w/pattern -Gripper: 15#, 11#, picking up 10 medium beads each -Theraputty: red putty, 10 beads, roll putty into a ball, pinch and pull to find 5 beads each hand  06/10/24 -Shoulder shrugs, 2#, x12 -Strengthening: 2#, bicep curls, pronated curls, x12 -Shoulder Strengthening: 1#, flexion, abduction, protraction, horizontal abduction, er/IR, x15 -X to V arms, x10 -Goal Post arms, x10 -Scapular Strengthening: red band, extension, retraction, rows, x12 -Digit ROM: composite flexion,  finger taps, opposition, x10  PATIENT EDUCATION: Education details: Continue HEP Person educated: Patient Education method: Programmer, multimedia, Demonstration, and Handouts Education comprehension: verbalized understanding and returned demonstration  HOME EXERCISE PROGRAM: 8/1: Wrist ROM and Digit ROM 8/8: Shoulder A/ROM 8/20: Scapular Strengthening   GOALS: Goals reviewed with patient? Yes  SHORT TERM GOALS: Target date: 06/19/24  Pt will be provided and educated on a comprehensive HEP for BUE mobility in order to complete ADL's and IADL's independently.   Goal status: IN PROGRESS  2.  Pt will improve BUE strength to 5/5 in order to lift and carry items during cooking and cleaning tasks.   Goal status: IN PROGRESS  3.  Pt will improve BUE grip by 15# and pinch by 3# in order to grasp and hold pots and pans.  Goal status: IN PROGRESS  4.  Pt will improve BUE coordination by completing 9 hole peg test in 45 or less in order to manipulate and complete fine motor tasks.   Goal status: IN PROGRESS   ASSESSMENT:  CLINICAL IMPRESSION:  Pt is demonstrating improving ROM and strength this session. She is able to tolerate increased repetitions with light weights, as well as good form. Pt then worked with the grooved peg board this session, where she had moderate to at  times maximal difficulty, especially with her R hand. With reassessment, pt will benefit from 4 more weeks of OT for 2x/week to continue addressing strength and coordination. OT providing verbal and visual cuing this session for positioning and technique.   PERFORMANCE DEFICITS: in functional skills including ADLs, IADLs, coordination, dexterity, sensation, ROM, strength, pain, fascial restrictions, Fine motor control, Gross motor control, body mechanics, and UE functional use.    PLAN:  OT FREQUENCY: 2x/week  OT DURATION: 4 weeks  PLANNED INTERVENTIONS: 97168 OT Re-evaluation, 97535 self care/ADL training, 02889 therapeutic exercise, 97530 therapeutic activity, 97112 neuromuscular re-education, 97140 manual therapy, 97035 ultrasound, 97018 paraffin, 02989 moist heat, 97032 electrical stimulation (manual), passive range of motion, functional mobility training, energy conservation, coping strategies training, patient/family education, and DME and/or AE instructions  CONSULTED AND AGREED WITH PLAN OF CARE: Patient  PLAN FOR NEXT SESSION: Wrist and Digit ROM, strengthening, coordination tasks   Valentin Nightingale, OTR/L  [phone] 06/17/2024, 2:13 PM

## 2024-06-16 NOTE — Therapy (Signed)
 OUTPATIENT PHYSICAL THERAPY NEURO TREATMENT   Patient Name: Angela Norman MRN: [id number] DOB:1956/03/27, 68 y.o., female Today's Date: 06/16/2024   PCP: Sebastian Othel GAILS, FNP REFERRING PROVIDER: Edna Norris, MD  END OF SESSION:  PT End of Session - 06/16/24 1423     Visit Number 9    Number of Visits 16    Date for PT Re-Evaluation 06/22/24    Authorization Type Medicare A & B    PT Start Time 1423    PT Stop Time 1508    PT Time Calculation (min) 45 min    Equipment Utilized During Treatment Gait belt    Activity Tolerance Patient tolerated treatment well    Behavior During Therapy WFL for tasks assessed/performed             Past Medical History:  Diagnosis Date   Anxiety    Arthritis    Basal cell carcinoma    Charcot's joint of foot    bilateral   Diabetes mellitus without complication (HCC)    Fibromyalgia    Neuropathy    Past Surgical History:  Procedure Laterality Date   ACHILLES TENDON REPAIR Left    bone spur Left    --left foot   BREAST SURGERY     benign breast bx--left--fatty tissue   CARPAL TUNNEL RELEASE Bilateral    COLONOSCOPY     COLONOSCOPY WITH PROPOFOL  N/A 09/06/2022   Procedure: COLONOSCOPY WITH PROPOFOL ;  Surgeon: Shaaron Lamar HERO, MD;  Location: [facility];  Service: Endoscopy;  Laterality: N/A;  10:30am, asa 2   DILATION AND CURETTAGE OF UTERUS  2011   for cervical polyp   FOOT SURGERY Left 03/2018   reconstructive surgery   POLYPECTOMY  09/06/2022   Procedure: POLYPECTOMY;  Surgeon: Shaaron Lamar HERO, MD;  Location: [facility];  Service: Endoscopy;;   Patient Active Problem List   Diagnosis Date Noted   Anxiety 06/28/2022   Arthritis 06/28/2022   Body fluid retention 06/28/2022   Hyperglycemia due to type 2 diabetes mellitus (HCC) 06/28/2022   Mixed hyperlipidemia 06/28/2022   Fibromyalgia 06/28/2022   Cough 10/25/2021   Musculoskeletal pain 05/21/2012   Stiffness of joint, not elsewhere classified,  ankle and foot 08/13/2011   Difficulty walking 08/13/2011   Ankle weakness 08/13/2011    ONSET DATE: November 2024  REFERRING DIAG: compression of spinal cord   THERAPY DIAG:  Other lack of coordination  Other symptoms and signs involving the nervous system  Compression of spinal cord (HCC)  Impaired functional mobility, balance, gait, and endurance  Muscle weakness (generalized)  Rationale for Evaluation and Treatment: Rehabilitation  SUBJECTIVE:  SUBJECTIVE STATEMENT:  Has not ordered a [place] yet to don compression garments; has had a lot going on at her house.  Also asked about the appropriate height for her walker.  Foot has been hurting a little more due to walking more 1-2/10.  Feet and lower legs tingle all the time.     Eval:  Pt reported all this started with UTI that caused fracture f neck and indicated as C4-C7 fusion. Pt had multiple bouts of acute and inpatient rehab stays due to complications. Significant BP issues throughout this time. Had stay in multiple facilities. Pt reported various changes in functional status, was requiring assist for ADLs and mobility since April 28th. Since then, pt has been home with Home Health services. Pt continues to report numbness in BUE and poor grip strength. Pt reports neuropathy in BLE. Prior to onset of November '24 was walking daily for 45-60 minutes.   Pt has CT scan for nodule on Lung on 04/28/24.  Pt accompanied by: self  PERTINENT HISTORY:  Cervical Myelopathy with ACDF 4-7 in November 2024\ Left ankle surgery x 3  PAIN:  Are you having pain? No and chronic pain in left shoulder  PRECAUTIONS: None  RED FLAGS: Bowel or bladder incontinence: Yes: SCI   WEIGHT BEARING RESTRICTIONS: No  FALLS: Has patient fallen in last 6 months?  Yes. Number of falls 1-2   PATIENT GOALS:  want to walk with AD well and with confidence and strengthening RUE.  OBJECTIVE:  Note: Objective measures were completed at Evaluation unless otherwise noted.  DIAGNOSTIC FINDINGS:   COGNITION: Overall cognitive status: Within functional limits for tasks assessed   SENSATION: Light touch: decreased light touch from lateral malleolus distally.   LOWER EXTREMITY ROM:     Active  Right Eval Left Eval  Hip flexion    Hip extension    Hip abduction    Hip adduction    Hip internal rotation    Hip external rotation    Knee flexion    Knee extension    Ankle dorsiflexion    Ankle plantarflexion    Ankle inversion    Ankle eversion     (Blank rows = not tested)  LOWER EXTREMITY MMT:    MMT Right Eval Left Eval  Hip flexion    Hip extension    Hip abduction    Hip adduction    Hip internal rotation    Hip external rotation    Knee flexion    Knee extension 4- 4+  Ankle dorsiflexion    Ankle plantarflexion    Ankle inversion    Ankle eversion    (Blank rows = not tested)   TRANSFERS: Sit to stand: Modified independence  Assistive device utilized: Environmental consultant - 2 wheeled     Stand to sit: Modified independence  Assistive device utilized: Environmental consultant - 2 wheeled     Chair to chair: SBA  Assistive device utilized: Environmental consultant - 2 wheeled       GAIT: Findings: Gait Characteristics: step through pattern, decreased arm swing- Right, decreased arm swing- Left, decreased step length- Right, decreased step length- Left, decreased hip/knee flexion- Right, decreased hip/knee flexion- Left, Right foot flat, Left foot flat, and narrow BOS, Distance walked: 3ft, Assistive device utilized:Walker - 2 wheeled, Level of assistance: Modified independence, and Comments:    FUNCTIONAL TESTS:  TUG: 25.71 seconds  DGI: 05/13/24: 8 / 24  L SLS: <0.1 seconds  R SLS: <0.1 seconds  Norms: 18-39  F: 43.5  seconds  M: 43.2 seconds 40-49  F: 40.4  seconds  M: 40.1 seconds 50-59  F: 36 seconds  M: 38.1 seconds 60-69  F: 25.1 seconds  M: 28.7 seconds 70-79  F: 11.3 seconds  M: 18.3 seconds  PATIENT SURVEYS:  ABC Scale:950 / 1600 = 59.4 %                                                                                                                              TREATMENT DATE:  06/16/24 Adjusted RW to appropriate height (1 notch down) Discussion of adjustafit versus compression stocking Standing with no UE assist in // bars; CGA for safety Stagger stance x 30 each Stagger stance with front foot on foam x 30 each Both feet on foam x 30 Both feet on foam with rhythmic stab x 30 Sit to stand to arm raise x 10; no UE assist 6 box toe taps with 1 UE assist 2 x 10 each 4# LAQ's 2 x 10 each with 2 hold Nustep seat 9 x 5' level 5 to end treatment    06/11/24: Nustep seat 9 x 5' dynamic warm up [employer] L5 UE/LE Educated techniques for donning compression garments, difficulty due to poor grasp  and tightness of compression sock, shown butler as option.  Reports increased Rt foot tenderness with socks on Leg press 10x 2 3Pl Toe tapping 4# on ankles alternating 6in step 2x 10 BUE--> 1 HHA 6in hurdels 4# forward and lateral 2RT with HHA  06/01/24 Nustep seat 9 x 5' dynamic warm up level 2 Sit to stand from standard chair with yellow med ball 2 x 10 4# hip abduction 2 x 10 4# hip extension 2 x 10 4# on legs stepping over small hurdles x 3 in // bars down and back x 4 with 1 UE assist Seated 4# LAQ's 2 x 10  05/27/24:   Nustep UE/LE [place] x 5' Seated: -Hip flexion then abduction to simulate getting in and out of vehicle over 6in hurdle Standing: -Hip flexion then abduction to simulate getting in and out of vehicle over 6in hurdle -STS 2x 10 first with red then yellow weight ball -Walking marches/butt kicks, 3 laps, 20 foot line, 4lb ankle weights -Sidestep with 4# on ankle, RTB on thigh 2RT front of  mat -Reciprocal pattern 7in step height reciprocal ascend, step to descending, heavy UE support 1RT -Tandem Stance 2x 30    05/25/24 Therapeutic Exercise: -Standing hip abd and ext; 2 sets 10 reps w/ 3# , bilaterally, pt cued for upright trunk and maintaining of neutral spine  Therapeutic Activity: -Sit to stands from mat table - 10 reps without UE A; pt cued for core activation - Sit to stand + step fwd/bwd onto airex without UE A 3 out of 10 reps (single UE A needed for 7 reps) - Weighted side stepping without UE A and with cues for maintaining  -Step up and back, 2  set of 5 reps bilaterally, pt requires BUE support -Walking marches/butt kicks, 3 laps, 20 foot line, 3lb ankle weights -Lateral step up and overs, 1 set of 5 reps, bilaterally, pt requires BUE support  05/21/2024  Therapeutic Exercise: -Step up and overs, 1 set of 5 reps bilaterally, pt requires BUE support -Lateral step up and overs, 1 set of 5 reps, bilaterally, pt requires BUE support -Standing 3 way hip 2 sets 7 reps, bilaterally, pt cued for upright trunk and maintaining of neutral spine -Forward lunges on bosu ball, BUE support, 1 set of 5 reps better performance going into LLE, pt cued for core activation and upright posture -Walking marches/butt kicks, 3 laps, 20 foot line, 3lb ankle weights   Therapeutic Activity: -Sit to stands with shoulder flexion with 5lb bar, 2 sets of 8 reps, pt cued for core activation -Step up and eccentric lowers on bosu ball, 1 set of 4 reps bilaterally, pt requires BUE support   05/19/24: BP 130/66 mmHg HR 83 bpm  STS 10x no HHA Toe tapping 6in step 2x 10 alternating 2 HHA 1st set, 1 HHA Sidestep with RTB around thigh 3RT Tandem stance with 1 HHA 2x 30 Tandem gait 2RT inside // bars Hamstring curls 10x each SLS with 1 heavy UE support x 30 (reports of LBP following this exercise)  Supine: Decompression 5x 5  PATIENT EDUCATION: Education details: PT Evaluation, findings,  prognosis, frequency, attendance policy. Person educated: Patient Education method: Explanation and Demonstration Education comprehension: verbalized understanding  HOME EXERCISE PROGRAM: Access Code: [email or web address] URL: [email or web address] Date: 05/14/2024 Prepared by: Augustin Mclean  Exercises - Sit to Stand  - 1 x daily - 7 x weekly - 3 sets - 10 reps - Standing March with Counter Support  - 1 x daily - 7 x weekly - 3 sets - 10 reps - Standing Hip Abduction with Counter Support  - 1 x daily - 7 x weekly - 3 sets - 10 reps - Side Stepping with Resistance at Thighs and Counter Support  - 1 x daily - 7 x weekly - 3 sets - 10 reps  GOALS: Goals reviewed with patient? No  SHORT TERM GOALS: Target date: 05/25/2024  Pt will be independent with HEP in order to demonstrate participation in Physical Therapy POC.  Baseline: Goal status: INITIAL  2.  Pt will improve ABC score by 15% in order to demonstrate improved pain with functional goals and outcomes. Baseline:  Goal status: INITIAL  LONG TERM GOALS: Target date: 06/22/2024  Pt will increase DGI score by > MCID in order to demonstrate improved functional safety and balance skills in ADL/mobility.   Baseline: see objective.  Goal status: INITIAL  2.  Pt will improve TUG by least 3 seconds in order to demonstrate improved functional mobility capacity in community setting.  Baseline: see objective.  Goal status: INITIAL  3.  Pt will improve ABC score by 30% in order to demonstrate improved pain with functional goals and outcomes. Baseline: see objective.  Goal status: INITIAL  4.  Pt will improve single leg balance by at least 3 seconds bilaterally in order to demonstrate improved capacity, safety, and overall quality of movement to optimize function.  Baseline: see objective.  Goal status: INITIAL  ASSESSMENT:  CLINICAL IMPRESSION: Patient asked on arrival about the proper height of her walker as she purchased  another and it is lower and feels more comfortable.  Lowered her walker to appropriate height.  PTA also showed patient another type  of compression garment to consider.  Continued with lower extremity strengthening and balance training.  Patient will benefit from continued skilled therapy services to address deficits and promote return to optimal function.     Eval:  Patient is a 68y.o. female who was seen today for physical therapy evaluation and treatment for 'compression of spinal cord.'  Patient with history of myelopathy C5 spinal fracture, with A C4 through C7 cervical fusion.  Patient presents with significant functional mobility deficits, safety concerns, increased falls risk, reduced ambulation capacity at due to muscle weakness, decreased activity tolerance, balance deficits in setting of previous cervical myelopathy pt will benefit from skilled Physical Therapy services to address deficits/limitations in order to improve functional and QOL.    OBJECTIVE IMPAIRMENTS: Abnormal gait, decreased activity tolerance, decreased balance, decreased mobility, difficulty walking, decreased strength, and postural dysfunction.   ACTIVITY LIMITATIONS: carrying, lifting, bending, standing, squatting, stairs, transfers, bed mobility, and locomotion level  PARTICIPATION LIMITATIONS: meal prep, cleaning, laundry, community activity, occupation, and yard work  PERSONAL FACTORS: Age are also affecting patient's functional outcome.   REHAB POTENTIAL: Excellent  CLINICAL DECISION MAKING: Stable/uncomplicated  EVALUATION COMPLEXITY: Low  PLAN:  PT FREQUENCY: 2x/week  PT DURATION: 8 weeks  PLANNED INTERVENTIONS: 97164- PT Re-evaluation, 97750- Physical Performance Testing, 97110-Therapeutic exercises, 97530- Therapeutic activity, [id number]- Neuromuscular re-education, 97535- Self Care, 02859- Manual therapy, [phone]- Gait training, [phone]- Orthotic Initial, Patient/Family education, Balance training, Stair  training, Taping, Joint mobilization, Joint manipulation, Spinal manipulation, Spinal mobilization, Cryotherapy, and Moist heat  PLAN FOR NEXT SESSION: Functional mobility, strengthening, ambulation, etc. Review donning compression garments.  3:14 PM, 06/16/24   3:14 PM, 06/16/24 Talon Witting Small Abigail Teall MPT [place] [place] [phone]

## 2024-06-18 ENCOUNTER — Ambulatory Visit (HOSPITAL_COMMUNITY)

## 2024-06-18 ENCOUNTER — Encounter (HOSPITAL_COMMUNITY): Payer: Self-pay | Admitting: Occupational Therapy

## 2024-06-18 ENCOUNTER — Encounter (HOSPITAL_COMMUNITY): Payer: Self-pay

## 2024-06-18 ENCOUNTER — Ambulatory Visit (HOSPITAL_COMMUNITY): Admitting: Occupational Therapy

## 2024-06-18 DIAGNOSIS — R278 Other lack of coordination: Secondary | ICD-10-CM | POA: Diagnosis not present

## 2024-06-18 DIAGNOSIS — G9520 Unspecified cord compression: Secondary | ICD-10-CM

## 2024-06-18 DIAGNOSIS — Z7409 Other reduced mobility: Secondary | ICD-10-CM

## 2024-06-18 DIAGNOSIS — M6281 Muscle weakness (generalized): Secondary | ICD-10-CM

## 2024-06-18 DIAGNOSIS — R29818 Other symptoms and signs involving the nervous system: Secondary | ICD-10-CM

## 2024-06-18 NOTE — Therapy (Signed)
 OUTPATIENT OCCUPATIONAL THERAPY NEURO TREATMENT NOTE  Patient Name: Angela Norman MRN: [id number] DOB:April 20, 1956, 68 y.o., female Today's Date: 06/18/2024     PCP: Shona Norleen PEDLAR, MD REFERRING PROVIDER: Hillman Amour, MD  END OF SESSION:  OT End of Session - 06/18/24 1501     Visit Number 10    Number of Visits 17    Date for OT Re-Evaluation 07/17/24    Authorization Type Medicare Part A and B    Progress Note Due on Visit 19    OT Start Time 1420    OT Stop Time 1458    OT Time Calculation (min) 38 min    Activity Tolerance Patient tolerated treatment well    Behavior During Therapy WFL for tasks assessed/performed           Past Medical History:  Diagnosis Date   Anxiety    Arthritis    Basal cell carcinoma    Charcot's joint of foot    bilateral   Diabetes mellitus without complication (HCC)    Fibromyalgia    Neuropathy    Past Surgical History:  Procedure Laterality Date   ACHILLES TENDON REPAIR Left    bone spur Left    --left foot   BREAST SURGERY     benign breast bx--left--fatty tissue   CARPAL TUNNEL RELEASE Bilateral    COLONOSCOPY     COLONOSCOPY WITH PROPOFOL  N/A 09/06/2022   Procedure: COLONOSCOPY WITH PROPOFOL ;  Surgeon: Shaaron Lamar HERO, MD;  [facility]: [facility];  Service: Endoscopy;  Laterality: N/A;  10:30am, asa 2   DILATION AND CURETTAGE OF UTERUS  2011   for cervical polyp   FOOT SURGERY Left 03/2018   reconstructive surgery   POLYPECTOMY  09/06/2022   Procedure: POLYPECTOMY;  Surgeon: Shaaron Lamar HERO, MD;  [facility];  Service: Endoscopy;;   Patient Active Problem List   Diagnosis Date Noted   Anxiety 06/28/2022   Arthritis 06/28/2022   Body fluid retention 06/28/2022   Hyperglycemia due to type 2 diabetes mellitus (HCC) 06/28/2022   Mixed hyperlipidemia 06/28/2022   Fibromyalgia 06/28/2022   Cough 10/25/2021   Musculoskeletal pain 05/21/2012   Stiffness of joint, not elsewhere classified, ankle and  foot 08/13/2011   Difficulty walking 08/13/2011   Ankle weakness 08/13/2011    ONSET DATE: 08/2023  REFERRING DIAG:  G95.20 (ICD-10-CM) - Unspecified cord compression  R25.2 (ICD-10-CM) - Spastic   THERAPY DIAG:  Other lack of coordination  Other symptoms and signs involving the nervous system  Rationale for Evaluation and Treatment: Rehabilitation  SUBJECTIVE:   SUBJECTIVE STATEMENT: S: I have trouble in my upper arm  PERTINENT HISTORY: Pt reported all this started with UTI that caused fracture f neck and indicated as C4-C7 fusion. Pt had multiple bouts of acute and inpatient rehab stays due to complications. Significant BP issues throughout this time. Had stay in multiple facilities. Pt reported various changes in functional status, was requiring assist for ADLs and mobility since April 28th. Since then, pt has been home with Home Health services. Pt continues to report numbness in BUE and poor grip strength. Pt reports neuropathy in BLE. Prior to onset of November '24 was walking daily for 45-60 minutes.   PRECAUTIONS: Fall  WEIGHT BEARING RESTRICTIONS: No  PAIN:  Are you having pain? No  FALLS: Has patient fallen in last 6 months? No  LIVING ENVIRONMENT: Lives with: lives with their spouse Lives in: House/apartment Stairs: Pt lives in the basement with no  stairs Has following equipment at home: Walker - 2 wheeled and shower chair  PLOF: Independent  PATIENT GOALS: To improve mobility and strengthening  OBJECTIVE:  Note: Objective measures were completed at Evaluation unless otherwise noted.  HAND DOMINANCE: Right  ADLs: Overall ADLs: Pt has difficulty with all small object manipulation, including but not limited to buttons, shoe laces, and zippers. Additionally she has max difficulty with cooking and cleaning due to weakness in both hands and wrists.   MOBILITY STATUS: Hx of falls, difficulty with turns, and difficulty carrying objections with  ambulation  POSTURE COMMENTS:  rounded shoulders Sitting balance: Moves/returns truncal midpoint 1-2 inches in multiple planes  ACTIVITY TOLERANCE: Activity tolerance: Fair tolerance - fatigues quicker than normal  FUNCTIONAL OUTCOME MEASURES: Quick Dash: 47.73  UPPER EXTREMITY ROM:    All ROM is WFL  UPPER EXTREMITY MMT:     MMT Right eval Left eval  Shoulder flexion 5/5 5/5  Shoulder abduction 5/5 5/5  Shoulder internal rotation 5/5 5/5  Shoulder external rotation 5/5 5/5  Elbow flexion 5/5 5/5  Elbow extension 5/5 5/5  Wrist flexion 4/5 4+/5  Wrist extension 4-/5 4/5  Wrist ulnar deviation 4-/5 4/5  Wrist radial deviation 4-/5 4-/5  Wrist pronation 4/5 4+/5  Wrist supination 4/5 4/5  (Blank rows = not tested)  HAND FUNCTION: Grip strength: Right: 20 lbs; Left: 27 lbs, Lateral pinch: Right: 5 lbs, Left: 9 lbs, and 3 point pinch: Right: 5 lbs, Left: 4 lbs  COORDINATION: 9 Hole Peg test: Right: -- sec; Left: -- sec  SENSATION: Pt has full numbness along median nerve of R hand as well as dulled sensation and tingling in BUE up to elbows.   EDEMA: No swelling noted  OBSERVATIONS: adducted thumbs and 80% grip                                                                                                                             TREATMENT DATE:  06/18/24 -Shoulder A/ROM: sitting-protraction, flexion, abduction, horizontal abduction, er, 15 reps -Proximal Shoulder Exercises: paddles, criss cross, circles both directions, 10 reps -Hand gripper, BUE: Right-large and medium beads with gripper vertical at 25#; Left-large and medium beads gripper horizontal at 25# -Wrist strengthening: 2#-extension, flexion, ulnar/radial deviation, 10 reps -Pinch strengthening: pt using red clothespin and 3 point pinch to grasp and stack 4 towers of 5 sponges with left hand, removing and replacing in bucket with right hand and lateral pinch; unable to maintain 3 point pinch with right  hand 3rd digit -Theraputty: red-flatten, cutting circles with pvc pipe, rolling, gripping, pinching  06/16/24 -Shoulder Strengthening: 1#, flexion, abduction, protraction, horizontal abduction, er/IR, x15 -Proximal Shoulder Exercises: paddles, criss cross, circles both directions, x10 -Strengthening: 2#, bicep curls, pronated curls, x12 -Grooved peg board, 10 pegs each hand - increased time  06/12/24 -Theraputty: red putty, roll into ball, flatten into pancake, roll into log, tripod pinch, lateral pinch, roll into ball, squeeze -Therabar: yellow, extension/flexion  twists, ulnar/radial deviation, supination/pronation, x10 -Large peg board w/pattern -Gripper: 15#, 11#, picking up 10 medium beads each -Theraputty: red putty, 10 beads, roll putty into a ball, pinch and pull to find 5 beads each hand    PATIENT EDUCATION: Education details: Continue HEP Person educated: Patient Education method: Programmer, multimedia, Demonstration, and Handouts Education comprehension: verbalized understanding and returned demonstration  HOME EXERCISE PROGRAM: 8/1: Wrist ROM and Digit ROM 8/8: Shoulder A/ROM 8/20: Scapular Strengthening   GOALS: Goals reviewed with patient? Yes  SHORT TERM GOALS: Target date: 06/19/24  Pt will be provided and educated on a comprehensive HEP for BUE mobility in order to complete ADL's and IADL's independently.   Goal status: IN PROGRESS  2.  Pt will improve BUE strength to 5/5 in order to lift and carry items during cooking and cleaning tasks.   Goal status: IN PROGRESS  3.  Pt will improve BUE grip by 15# and pinch by 3# in order to grasp and hold pots and pans.  Goal status: IN PROGRESS  4.  Pt will improve BUE coordination by completing 9 hole peg test in 45 or less in order to manipulate and complete fine motor tasks.   Goal status: IN PROGRESS   ASSESSMENT:  CLINICAL IMPRESSION:  Pt is reporting pain in bilateral shoulder after strengthening exercises,  reduced to A/ROM versus using hand weights to improve success and activity tolerance. Pt able to increase gripper resistance to 25# from 15# today, added pinch task today. Pt more successful with left hand 3 point pinch due to limitations in right 3rd digit. Heavy emphasis on grip and pinch today working towards improved functioning required for ADL completion at home. Verbal cuing for form and technique, rest breaks provided as needed.    PERFORMANCE DEFICITS: in functional skills including ADLs, IADLs, coordination, dexterity, sensation, ROM, strength, pain, fascial restrictions, Fine motor control, Gross motor control, body mechanics, and UE functional use.    PLAN:  OT FREQUENCY: 2x/week  OT DURATION: 4 weeks  PLANNED INTERVENTIONS: 97168 OT Re-evaluation, 97535 self care/ADL training, 02889 therapeutic exercise, 97530 therapeutic activity, 97112 neuromuscular re-education, 97140 manual therapy, 97035 ultrasound, 97018 paraffin, 02989 moist heat, 97032 electrical stimulation (manual), passive range of motion, functional mobility training, energy conservation, coping strategies training, patient/family education, and DME and/or AE instructions  CONSULTED AND AGREED WITH PLAN OF CARE: Patient  PLAN FOR NEXT SESSION: Wrist and Digit ROM, strengthening, coordination tasks   Sonny Cory, OTR/L  [phone] 06/18/2024, 3:02 PM

## 2024-06-18 NOTE — Therapy (Signed)
 OUTPATIENT PHYSICAL THERAPY NEURO TREATMENT   Patient Name: Angela Norman MRN: [id number] DOB:05-31-1956, 68 y.o., female Today's Date: 06/18/2024   PCP: Sebastian Othel GAILS, FNP REFERRING PROVIDER: Edna Norris, MD  END OF SESSION:  PT End of Session - 06/18/24 1518     Visit Number 10    Number of Visits 16    Date for PT Re-Evaluation 06/22/24    Authorization Type Medicare A & B    PT Start Time 1518    PT Stop Time 1556    PT Time Calculation (min) 38 min    Activity Tolerance Patient tolerated treatment well    Behavior During Therapy WFL for tasks assessed/performed              Past Medical History:  Diagnosis Date   Anxiety    Arthritis    Basal cell carcinoma    Charcot's joint of foot    bilateral   Diabetes mellitus without complication (HCC)    Fibromyalgia    Neuropathy    Past Surgical History:  Procedure Laterality Date   ACHILLES TENDON REPAIR Left    bone spur Left    --left foot   BREAST SURGERY     benign breast bx--left--fatty tissue   CARPAL TUNNEL RELEASE Bilateral    COLONOSCOPY     COLONOSCOPY WITH PROPOFOL  N/A 09/06/2022   Procedure: COLONOSCOPY WITH PROPOFOL ;  Surgeon: Shaaron Lamar HERO, MD;  [facility];  Service: Endoscopy;  Laterality: N/A;  10:30am, asa 2   DILATION AND CURETTAGE OF UTERUS  2011   for cervical polyp   FOOT SURGERY Left 03/2018   reconstructive surgery   POLYPECTOMY  09/06/2022   Procedure: POLYPECTOMY;  Surgeon: Shaaron Lamar HERO, MD;  [facility]: [facility];  Service: Endoscopy;;   Patient Active Problem List   Diagnosis Date Noted   Anxiety 06/28/2022   Arthritis 06/28/2022   Body fluid retention 06/28/2022   Hyperglycemia due to type 2 diabetes mellitus (HCC) 06/28/2022   Mixed hyperlipidemia 06/28/2022   Fibromyalgia 06/28/2022   Cough 10/25/2021   Musculoskeletal pain 05/21/2012   Stiffness of joint, not elsewhere classified, ankle and foot 08/13/2011   Difficulty walking  08/13/2011   Ankle weakness 08/13/2011    ONSET DATE: November 2024  REFERRING DIAG: compression of spinal cord   THERAPY DIAG:  Compression of spinal cord (HCC)  Impaired functional mobility, balance, gait, and endurance  Muscle weakness (generalized)  Rationale for Evaluation and Treatment: Rehabilitation  SUBJECTIVE:  SUBJECTIVE STATEMENT:  Pt states she is just a little sore in the shoulders from OT this date, no real pain. Pt states she has been walking more in the house, about 16 laps completed 2 days earlier this week and counter exercises. Pt states right foot is continuing to swell occasionally but ordered [place] today.      Eval:  Pt reported all this started with UTI that caused fracture of neck and indicated as C4-C7 fusion. Pt had multiple bouts of acute and inpatient rehab stays due to complications. Significant BP issues throughout this time. Had stay in multiple facilities. Pt reported various changes in functional status, was requiring assist for ADLs and mobility since April 28th. Since then, pt has been home with Home Health services. Pt continues to report numbness in BUE and poor grip strength. Pt reports neuropathy in BLE. Prior to onset of November '24 was walking daily for 45-60 minutes.   Pt has CT scan for nodule on Lung on 04/28/24.  Pt accompanied by: self  PERTINENT HISTORY:  Cervical Myelopathy with ACDF 4-7 in November 2024\ Left ankle surgery x 3  PAIN:  Are you having pain? No and chronic pain in left shoulder  PRECAUTIONS: None  RED FLAGS: Bowel or bladder incontinence: Yes: SCI   WEIGHT BEARING RESTRICTIONS: No  FALLS: Has patient fallen in last 6 months? Yes. Number of falls 1-2   PATIENT GOALS:  want to walk with AD well and with confidence and  strengthening RUE.  OBJECTIVE:  Note: Objective measures were completed at Evaluation unless otherwise noted.  DIAGNOSTIC FINDINGS:   COGNITION: Overall cognitive status: Within functional limits for tasks assessed   SENSATION: Light touch: decreased light touch from lateral malleolus distally.   LOWER EXTREMITY ROM:     Active  Right Eval Left Eval  Hip flexion    Hip extension    Hip abduction    Hip adduction    Hip internal rotation    Hip external rotation    Knee flexion    Knee extension    Ankle dorsiflexion    Ankle plantarflexion    Ankle inversion    Ankle eversion     (Blank rows = not tested)  LOWER EXTREMITY MMT:    MMT Right Eval Left Eval  Hip flexion    Hip extension    Hip abduction    Hip adduction    Hip internal rotation    Hip external rotation    Knee flexion    Knee extension 4- 4+  Ankle dorsiflexion    Ankle plantarflexion    Ankle inversion    Ankle eversion    (Blank rows = not tested)   TRANSFERS: Sit to stand: Modified independence  Assistive device utilized: Environmental consultant - 2 wheeled     Stand to sit: Modified independence  Assistive device utilized: Environmental consultant - 2 wheeled     Chair to chair: SBA  Assistive device utilized: Environmental consultant - 2 wheeled       GAIT: Findings: Gait Characteristics: step through pattern, decreased arm swing- Right, decreased arm swing- Left, decreased step length- Right, decreased step length- Left, decreased hip/knee flexion- Right, decreased hip/knee flexion- Left, Right foot flat, Left foot flat, and narrow BOS, Distance walked: 90ft, Assistive device utilized:Walker - 2 wheeled, Level of assistance: Modified independence, and Comments:    FUNCTIONAL TESTS:  TUG: 25.71 seconds  DGI: 05/13/24: 8 / 24  L SLS: <0.1 seconds  R SLS: <0.1 seconds  Norms: 18-39  F: 43.5 seconds  M: 43.2 seconds 40-49  F: 40.4 seconds  M: 40.1 seconds 50-59  F: 36 seconds  M: 38.1 seconds 60-69  F: 25.1 seconds  M: 28.7  seconds 70-79  F: 11.3 seconds  M: 18.3 seconds  PATIENT SURVEYS:  ABC Scale:950 / 1600 = 59.4 %                                                                                                                              TREATMENT DATE:  06/18/2024  Therapeutic Exercise: -Nustep seat 9 x 5' level 6, pt avgs 50 SPM -Seated hamstring curls, 2 sets of 10 reps, plate 4>plate 5, pt cued for increased ROM and eccentric control -Standing hip abd and ext; 2 sets 10 reps w/ 3# , bilaterally, pt cued for upright trunk and maintaining of neutral spine  Therapeutic Activity: -Sit to stands from standard chair - 6 reps with tidal tank and push out; pt cued for core activation -Sled push, no weight, 2 laps, 50 foot laps, pt cued for consistent movement of sled and educated on functionality -Step ups, 1 set of 6 reps, 3lb ankle weights, pt cued for sequencing -Lateral step up and overs, 6 inch step, 3lb ankle weights, 1 set of 5 reps, bilaterally, pt requires BUE support   06/16/24 Adjusted RW to appropriate height (1 notch down) Discussion of adjustafit versus compression stocking Standing with no UE assist in // bars; CGA for safety Stagger stance x 30 each Stagger stance with front foot on foam x 30 each Both feet on foam x 30 Both feet on foam with rhythmic stab x 30 Sit to stand to arm raise x 10; no UE assist 6 box toe taps with 1 UE assist 2 x 10 each 4# LAQ's 2 x 10 each with 2 hold Nustep seat 9 x 5' level 5 to end treatment    06/11/24: Nustep seat 9 x 5' dynamic warm up [employer] L5 UE/LE Educated techniques for donning compression garments, difficulty due to poor grasp  and tightness of compression sock, shown butler as option.  Reports increased Rt foot tenderness with socks on Leg press 10x 2 3Pl Toe tapping 4# on ankles alternating 6in step 2x 10 BUE--> 1 HHA 6in hurdels 4# forward and lateral 2RT with HHA    PATIENT EDUCATION: Education details: PT Evaluation,  findings, prognosis, frequency, attendance policy. Person educated: Patient Education method: Explanation and Demonstration Education comprehension: verbalized understanding  HOME EXERCISE PROGRAM: Access Code: [email or web address] URL: [email or web address] Date: 05/14/2024 Prepared by: Augustin Mclean  Exercises - Sit to Stand  - 1 x daily - 7 x weekly - 3 sets - 10 reps - Standing March with Counter Support  - 1 x daily - 7 x weekly - 3 sets - 10 reps - Standing Hip Abduction with Counter Support  - 1 x daily - 7 x weekly - 3 sets - 10 reps - Side Stepping  with Resistance at Thighs and Counter Support  - 1 x daily - 7 x weekly - 3 sets - 10 reps  GOALS: Goals reviewed with patient? No  SHORT TERM GOALS: Target date: 05/25/2024  Pt will be independent with HEP in order to demonstrate participation in Physical Therapy POC.  Baseline: Goal status: INITIAL  2.  Pt will improve ABC score by 15% in order to demonstrate improved pain with functional goals and outcomes. Baseline:  Goal status: INITIAL  LONG TERM GOALS: Target date: 06/22/2024  Pt will increase DGI score by > MCID in order to demonstrate improved functional safety and balance skills in ADL/mobility.   Baseline: see objective.  Goal status: INITIAL  2.  Pt will improve TUG by least 3 seconds in order to demonstrate improved functional mobility capacity in community setting.  Baseline: see objective.  Goal status: INITIAL  3.  Pt will improve ABC score by 30% in order to demonstrate improved pain with functional goals and outcomes. Baseline: see objective.  Goal status: INITIAL  4.  Pt will improve single leg balance by at least 3 seconds bilaterally in order to demonstrate improved capacity, safety, and overall quality of movement to optimize function.  Baseline: see objective.  Goal status: INITIAL  ASSESSMENT:  CLINICAL IMPRESSION: Patient continues to demonstrate decreased LE strength/mobility, decreased  gait quality and balance. Patient also demonstrates good response to new activities during today's session with good motivation and attitude throughout. Patient able to progress dynamic balance and core activation exercises today with resisted step ups and leg strengthening machines, good performance with verbal cueing. Patient would continue to benefit from skilled physical therapy for increased endurance with ambulation, increased LE strength, and improved balance for improved quality of life, improved independence with gait training and continued progress towards therapy goals.   Eval:  Patient is a 68y.o. female who was seen today for physical therapy evaluation and treatment for 'compression of spinal cord.'  Patient with history of myelopathy C5 spinal fracture, with A C4 through C7 cervical fusion.  Patient presents with significant functional mobility deficits, safety concerns, increased falls risk, reduced ambulation capacity at due to muscle weakness, decreased activity tolerance, balance deficits in setting of previous cervical myelopathy pt will benefit from skilled Physical Therapy services to address deficits/limitations in order to improve functional and QOL.    OBJECTIVE IMPAIRMENTS: Abnormal gait, decreased activity tolerance, decreased balance, decreased mobility, difficulty walking, decreased strength, and postural dysfunction.   ACTIVITY LIMITATIONS: carrying, lifting, bending, standing, squatting, stairs, transfers, bed mobility, and locomotion level  PARTICIPATION LIMITATIONS: meal prep, cleaning, laundry, community activity, occupation, and yard work  PERSONAL FACTORS: Age are also affecting patient's functional outcome.   REHAB POTENTIAL: Excellent  CLINICAL DECISION MAKING: Stable/uncomplicated  EVALUATION COMPLEXITY: Low  PLAN:  PT FREQUENCY: 2x/week  PT DURATION: 8 weeks  PLANNED INTERVENTIONS: 97164- PT Re-evaluation, 97750- Physical Performance Testing,  97110-Therapeutic exercises, 97530- Therapeutic activity, [id number]- Neuromuscular re-education, 97535- Self Care, 02859- Manual therapy, [phone]- Gait training, [phone]- Orthotic Initial, Patient/Family education, Balance training, Stair training, Taping, Joint mobilization, Joint manipulation, Spinal manipulation, Spinal mobilization, Cryotherapy, and Moist heat  PLAN FOR NEXT SESSION: Functional mobility, strengthening, ambulation, etc. Review donning compression garments. Progress note  Lang Ada, PT, DPT [facility] Office: [phone] 5:21 PM, 06/18/24

## 2024-06-21 ENCOUNTER — Emergency Department (HOSPITAL_COMMUNITY)

## 2024-06-21 ENCOUNTER — Emergency Department (HOSPITAL_COMMUNITY): Admission: EM | Admit: 2024-06-21 | Discharge: 2024-06-21 | Disposition: A | Discharge status: Home / Self Care

## 2024-06-21 DIAGNOSIS — R4182 Altered mental status, unspecified: Secondary | ICD-10-CM | POA: Diagnosis present

## 2024-06-21 DIAGNOSIS — E11610 Type 2 diabetes mellitus with diabetic neuropathic arthropathy: Secondary | ICD-10-CM | POA: Insufficient documentation

## 2024-06-21 DIAGNOSIS — Z87440 Personal history of urinary (tract) infections: Secondary | ICD-10-CM | POA: Diagnosis not present

## 2024-06-21 DIAGNOSIS — H8109 Meniere's disease, unspecified ear: Secondary | ICD-10-CM

## 2024-06-21 DIAGNOSIS — E119 Type 2 diabetes mellitus without complications: Secondary | ICD-10-CM | POA: Insufficient documentation

## 2024-06-21 DIAGNOSIS — R112 Nausea with vomiting, unspecified: Secondary | ICD-10-CM | POA: Diagnosis not present

## 2024-06-21 DIAGNOSIS — R42 Dizziness and giddiness: Secondary | ICD-10-CM | POA: Insufficient documentation

## 2024-06-21 DIAGNOSIS — N3000 Acute cystitis without hematuria: Secondary | ICD-10-CM | POA: Diagnosis not present

## 2024-06-21 DIAGNOSIS — I959 Hypotension, unspecified: Secondary | ICD-10-CM

## 2024-06-21 DIAGNOSIS — E1142 Type 2 diabetes mellitus with diabetic polyneuropathy: Secondary | ICD-10-CM | POA: Insufficient documentation

## 2024-06-21 DIAGNOSIS — Z9889 Other specified postprocedural states: Secondary | ICD-10-CM | POA: Insufficient documentation

## 2024-06-21 DIAGNOSIS — G8250 Quadriplegia, unspecified: Secondary | ICD-10-CM | POA: Diagnosis not present

## 2024-06-21 DIAGNOSIS — I4519 Other right bundle-branch block: Secondary | ICD-10-CM | POA: Insufficient documentation

## 2024-06-21 DIAGNOSIS — H8110 Benign paroxysmal vertigo, unspecified ear: Secondary | ICD-10-CM

## 2024-06-21 DIAGNOSIS — S14159S Other incomplete lesion at unspecified level of cervical spinal cord, sequela: Secondary | ICD-10-CM | POA: Diagnosis not present

## 2024-06-21 DIAGNOSIS — Z7984 Long term (current) use of oral hypoglycemic drugs: Secondary | ICD-10-CM | POA: Diagnosis not present

## 2024-06-21 DIAGNOSIS — W19XXXS Unspecified fall, sequela: Secondary | ICD-10-CM | POA: Insufficient documentation

## 2024-06-21 DIAGNOSIS — Z79899 Other long term (current) drug therapy: Secondary | ICD-10-CM | POA: Insufficient documentation

## 2024-06-21 DIAGNOSIS — H8120 Vestibular neuronitis, unspecified ear: Secondary | ICD-10-CM

## 2024-06-21 LAB — DIFFERENTIAL
Abs Immature Granulocytes: 0.04 K/uL (ref 0.00–0.07)
Basophils Absolute: 0.1 K/uL (ref 0.0–0.1)
Basophils Relative: 1 %
Eosinophils Absolute: 0.2 K/uL (ref 0.0–0.5)
Eosinophils Relative: 1 %
Immature Granulocytes: 0 %
Lymphocytes Relative: 11 %
Lymphs Abs: 1.3 K/uL (ref 0.7–4.0)
Monocytes Absolute: 0.7 K/uL (ref 0.1–1.0)
Monocytes Relative: 6 %
Neutro Abs: 9.9 K/uL — ABNORMAL HIGH (ref 1.7–7.7)
Neutrophils Relative %: 81 %

## 2024-06-21 LAB — COMPREHENSIVE METABOLIC PANEL WITH GFR
ALT: 25 U/L (ref 0–44)
AST: 22 U/L (ref 15–41)
Albumin: 4.4 g/dL (ref 3.5–5.0)
Alkaline Phosphatase: 77 U/L (ref 38–126)
Anion gap: 13 (ref 5–15)
BUN: 20 mg/dL (ref 8–23)
CO2: 25 mmol/L (ref 22–32)
Calcium: 9.4 mg/dL (ref 8.9–10.3)
Chloride: 99 mmol/L (ref 98–111)
Creatinine, Ser: 0.45 mg/dL (ref 0.44–1.00)
GFR, Estimated: >60 mL/min (ref >60)
Glucose, Bld: 124 mg/dL — ABNORMAL HIGH (ref 70–99)
Potassium: 3.5 mmol/L (ref 3.5–5.1)
Sodium: 137 mmol/L (ref 135–145)
Total Bilirubin: 0.9 mg/dL (ref 0.0–1.2)
Total Protein: 7.5 g/dL (ref 6.5–8.1)

## 2024-06-21 LAB — CBC
HCT: 43.9 % (ref 36.0–46.0)
Hemoglobin: 14.5 g/dL (ref 12.0–15.0)
MCH: 31.5 pg (ref 26.0–34.0)
MCHC: 33 g/dL (ref 30.0–36.0)
MCV: 95.2 fL (ref 80.0–100.0)
Platelets: 263 K/uL (ref 150–400)
RBC: 4.61 MIL/uL (ref 3.87–5.11)
RDW: 12.9 % (ref 11.5–15.5)
WBC: 12.2 K/uL — ABNORMAL HIGH (ref 4.0–10.5)
nRBC: 0 % (ref 0.0–0.2)

## 2024-06-21 LAB — RAPID URINE DRUG SCREEN, HOSP PERFORMED
Amphetamines: NOT DETECTED
Barbiturates: NOT DETECTED
Benzodiazepines: NOT DETECTED
Cocaine: NOT DETECTED
Opiates: NOT DETECTED
Tetrahydrocannabinol: NOT DETECTED

## 2024-06-21 LAB — URINALYSIS, ROUTINE W REFLEX MICROSCOPIC
Bacteria, UA: NONE SEEN
Bilirubin Urine: NEGATIVE
Glucose, UA: NEGATIVE mg/dL
Hgb urine dipstick: NEGATIVE
Ketones, ur: NEGATIVE mg/dL
Nitrite: NEGATIVE
Protein, ur: NEGATIVE mg/dL
Specific Gravity, Urine: 1.014 (ref 1.005–1.030)
pH: 5 (ref 5.0–8.0)

## 2024-06-21 LAB — PROTIME-INR
INR: 0.9 (ref 0.8–1.2)
Prothrombin Time: 13 s (ref 11.4–15.2)

## 2024-06-21 LAB — CBG MONITORING, ED: Glucose-Capillary: 114 mg/dL — ABNORMAL HIGH (ref 70–99)

## 2024-06-21 LAB — ETHANOL: Alcohol, Ethyl (B): <15 mg/dL (ref <15)

## 2024-06-21 LAB — APTT: aPTT: 28 s (ref 24–36)

## 2024-06-21 MED ORDER — CEPHALEXIN 500 MG PO CAPS: 500.0000 mg | ORAL_CAPSULE | Freq: Two times a day (BID) | ORAL | 0 refills | Status: DC

## 2024-06-21 MED ORDER — MECLIZINE HCL 12.5 MG PO TABS: 25.0000 mg | ORAL_TABLET | Freq: Once | ORAL | Status: DC

## 2024-06-21 MED ORDER — MECLIZINE HCL 25 MG PO TABS: 25.0000 mg | ORAL_TABLET | Freq: Three times a day (TID) | ORAL | 0 refills | Status: AC | PRN

## 2024-06-21 MED ORDER — MECLIZINE HCL 12.5 MG PO TABS
25.0000 mg | ORAL_TABLET | Freq: Once | ORAL | Status: AC
  Administered 2024-06-21: 25 mg via ORAL
  Filled 2024-06-21: qty 2

## 2024-06-21 MED ORDER — ONDANSETRON HCL 4 MG/2ML IJ SOLN
4.0000 mg | Freq: Once | INTRAMUSCULAR | Status: AC
  Administered 2024-06-21: 4 mg via INTRAVENOUS
  Filled 2024-06-21: qty 2

## 2024-06-21 MED ORDER — CEPHALEXIN 500 MG PO CAPS
500.0000 mg | ORAL_CAPSULE | Freq: Once | ORAL | Status: AC
  Administered 2024-06-21: 500 mg via ORAL
  Filled 2024-06-21: qty 1

## 2024-06-21 NOTE — ED Triage Notes (Signed)
 Pt c/o dizziness that started at 4:30 pm today. States that it gets worse with movement, pt has a Hx of a fall that broke her neck and now has nerve damage in bilateral arms and legs at baseline and lack of sensation/nerve damage from where pt broke her neck.

## 2024-06-21 NOTE — Discharge Instructions (Signed)
 Please take the meclizine  as needed for dizziness.  Take the antibiotic to completion for the possible urine infection.  Please call and schedule a follow-up appointment with the ENT doctor at the number provided if you are still having symptoms on Tuesday.  Return to the ER for worsening symptoms.

## 2024-06-21 NOTE — ED Notes (Signed)
 Pt sts she felt lightheaded and had sudden dizziness like the room was spinning around 430 pm. Then began nauseas. No weakness. No double vision. No vision loss. Normally walks with a walker.

## 2024-06-21 NOTE — ED Notes (Signed)
 POC per [facility] Neurology following findings of NIH 0. No concerns for stroke. EDP Tee at bedside discussing POC with Tele Neuro

## 2024-06-21 NOTE — Consult Note (Incomplete)
 [facility] TeleNeurology Consult Services    Date of Service:  06/21/2024     Metrics: Last Known Well: 4:30 PM Symptoms: As per HPI.  Patient is not a candidate for thrombolytic. Presentation is most consistent with peripheral vertigo with NIHSS 0.   Location of the provider: [facility]  Location of the patient: [facility] Pre-Morbid Modified Rankin Scale: 3  (from cervical spinal cord injury in November)  This consult was provided via telemedicine with 2-way video and audio communication. The patient/family was informed that care would be provided in this way and agreed to receive care in this manner.   ED Physician notified of diagnostic impression and management plan after completion of the neurological evaluation.    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   -*** - CT head: Negative head CT. No acute intracranial abnormality. Aspects is 10.  - Labs:  - WBC elevated at 12.2  - Coags normal - EtOH negative     Recommendations: - ***.       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 States that it gets worse with movement. She has a history of a fall off of a toilet in November of last year in the setting of hypotension secondary to a UTI, with resulting cervical  spinal cord injury; she states that she has nerve damage in bilateral arms and legs with weakness and sensory numbness at baseline. She has undergone physical therapy after recovery from the initial operation (cervical fusion) and started being able to walk again with a walker about 2 months ago.   Onset of symptoms was at 4:30 PM, when she suddenly felt light-headed. She went to lie down and upon doing so, started to feel vertiginous. About 1 hour later she started to feel nauseous and then vomited twice. Her husband then drove her to the ED where a Code Stroke was called. The patient felt dizzy again when being moved to the CT table, described as a spinning sensation. BP after CT was 86/45.   She states that she has not experienced any double vision along with her dizziness today, as well as no vision loss and no worsening of her chronic numbness and weakness. She denies headache.   She does have a remote history of vertigo in the past that lasted for about 1 week before resolving spontaneously; vertigo would worsen with movement. With her November spell, she had vertigo again, which had been associated with fever and chills and impaired her ability to walk, prior to her passing out and injuring her neck, as described above.      Past Medical History: Past Medical History:  Diagnosis Date  . Anxiety   . Arthritis   . Basal cell carcinoma   . Charcot's joint of foot    bilateral  . Diabetes mellitus without complication (HCC)   . Fibromyalgia   . Neuropathy       Past Surgical  History: Past Surgical History:  Procedure Laterality Date  . ACHILLES TENDON REPAIR Left   . bone spur Left    --left foot  . BREAST SURGERY     benign breast bx--left--fatty tissue  . CARPAL TUNNEL RELEASE Bilateral   . COLONOSCOPY    . COLONOSCOPY WITH PROPOFOL  N/A 09/06/2022   Procedure: COLONOSCOPY WITH PROPOFOL ;  Surgeon: Shaaron Lamar HERO, MD;  [facility];  Service: Endoscopy;  Laterality:  N/A;  10:30am, asa 2  . DILATION AND CURETTAGE OF UTERUS  2011   for cervical polyp  . FOOT SURGERY Left 03/2018   reconstructive surgery  . POLYPECTOMY  09/06/2022   Procedure: POLYPECTOMY;  Surgeon: Shaaron Lamar HERO, MD;  [facility];  Service: Endoscopy;;      Medications:  No current facility-administered medications on file prior to encounter.   Current Outpatient Medications on File Prior to Encounter  Medication Sig Dispense Refill  . cyclobenzaprine (FLEXERIL) 5 MG tablet Take 5 mg by mouth every 8 (eight) hours as needed.    . desonide (DESOWEN) 0.05 % cream Apply 1 Application topically 2 (two) times daily as needed (on eye lids).    . doxycycline (VIBRAMYCIN) 100 MG capsule Take 100 mg by mouth 2 (two) times daily.    SABRA gabapentin (NEURONTIN) 600 MG tablet Take 300-600 mg by mouth See admin instructions. Take 300 mg in the afternoon and 600 mg at bedtime    . hyoscyamine  (LEVSIN  SL) 0.125 MG SL tablet Place 1 tablet (0.125 mg total) under the tongue every 4 (four) hours as needed. 30 tablet 3  . metFORMIN (GLUCOPHAGE) 500 MG tablet Take 500 mg by mouth 2 (two) times daily with a meal.    . metolazone (ZAROXOLYN) 5 MG tablet Take 5 mg by mouth once a week.    . NON FORMULARY Take 1 tablet by mouth daily. Carbon 98 BX--a fish oil.  Pt. Takes 5x/week    . ONETOUCH VERIO test strip daily.    SABRA OVER THE COUNTER MEDICATION Take 5 tablets by mouth daily. Chromic Chloride    . Polyethyl Glycol-Propyl Glycol (SYSTANE) 0.4-0.3 % SOLN Place 1 drop into both eyes daily as needed (dry eyes). PF orginal        Social History: Rare EtOH No drug use Never smoker   Family History:  Reviewed in Epic   ROS: As per HPI    Anticoagulant use:  No   Antiplatelet use: No   Examination:    BP (!) 145/60 (BP Location: Right Arm)   Pulse 68   Temp 98.2 F (36.8 C) (Oral)   Resp 20   LMP 10/22/2008   SpO2 100%     1A: Level of Consciousness - 0 1B: Ask Month and Age -  0 1C: Blink Eyes & Squeeze Hands - 0 2: Test Horizontal Extraocular Movements - 0  (No nystagmus with central, lateral or upgaze).  3: Test Visual Fields - 0 4: Test Facial Palsy (Use Grimace if Obtunded) - 0 5A: Test Left Arm Motor Drift - 0 (finger, wrist extension and grip weakness 5B: Test Right Arm Motor Drift - 0 6A: Test Left Leg Motor Drift - 0 6B: Test Right Leg Motor Drift - 0 7: Test Limb Ataxia (FNF/Heel-Shin) - 0  (slow FNF on the right, but no ataxia) 8: Test Sensation -  0 9: Test Language/Aphasia - 0 10: Test Dysarthria - 0 11: Test Extinction/Inattention - 0   NIHSS Score: 0  Patient/Family was informed the Neurology Consult would occur via TeleHealth consult by way of interactive audio and video telecommunications and consented to receiving care in this manner.   Patient is being evaluated for possible acute neurologic impairment and high pretest probability of imminent or life-threatening deterioration. I spent total of 40 minutes providing care to this patient, including time for face to face visit via telemedicine, review of medical records, imaging studies and discussion of findings with providers, the patient and/or family.   Electronically signed: Dr. Charmian Forbis

## 2024-06-21 NOTE — ED Provider Notes (Signed)
 [place] EMERGENCY DEPARTMENT AT [facility] Provider Note   CSN: [id number] Arrival date & time: 06/21/24  8167     Patient presents with: Dizziness   Angela Norman is a 68 y.o. female.   68 year old female past medical history of diabetes and neuropathy as well as remote history of vertigo presenting to the emergency department today with acute onset of dizziness at 4:30 PM.  The patient states that this began while she was seated.  She states that she is having dizziness both when she is moving and when she is sitting still with her eyes closed.  She has had some nausea and vomiting with this as well.  Denies any headaches.  She denies any focal weakness, numbness, or tingling.  She came to the emergency department today due to these ongoing symptoms and the abrupt onset of symptoms.   Dizziness      Prior to Admission medications   Medication Sig Start Date End Date Taking? Authorizing Provider  baclofen (LIORESAL) 10 MG tablet Take 10 mg by mouth at bedtime.   Yes [provider]  cephALEXin  (KEFLEX ) 500 MG capsule Take 1 capsule (500 mg total) by mouth 2 (two) times daily. 06/21/24  Yes Ula Prentice SAUNDERS, MD  fluticasone (CUTIVATE) 0.05 % cream Apply 1 Application topically 2 (two) times daily as needed. 06/02/24  Yes [provider]  gabapentin (NEURONTIN) 600 MG tablet Take 300-600 mg by mouth See admin instructions. Take 300 mg in the afternoon and 600 mg at bedtime   Yes [provider]  meclizine  (ANTIVERT ) 25 MG tablet Take 1 tablet (25 mg total) by mouth 3 (three) times daily as needed for dizziness. 06/21/24  Yes Ula Prentice SAUNDERS, MD  metFORMIN (GLUCOPHAGE) 500 MG tablet Take 500 mg by mouth 2 (two) times daily with a meal.   Yes [provider]  cyclobenzaprine (FLEXERIL) 5 MG tablet Take 5 mg by mouth every 8 (eight) hours as needed. 01/17/23   [provider]  metolazone (ZAROXOLYN) 5 MG tablet Take 5 mg by mouth  once a week.    [provider]  [facility] VERIO test strip daily. 09/17/22   [provider]  Polyethyl Glycol-Propyl Glycol (SYSTANE) 0.4-0.3 % SOLN Place 1 drop into both eyes daily as needed (dry eyes). PF orginal    [provider]    Allergies: Patient has no known allergies.    Review of Systems  Neurological:  Positive for dizziness.  All other systems reviewed and are negative.   Updated Vital Signs BP (!) 115/47   Pulse 74   Temp 98.2 F (36.8 C) (Oral)   Resp 17   LMP 10/22/2008   SpO2 96%   Physical Exam Vitals and nursing note reviewed.   Gen: NAD Eyes: PERRL, EOMI HEENT: no oropharyngeal swelling, no appreciable nystagmus noted Neck: trachea midline Resp: clear to auscultation bilaterally Card: RRR, no murmurs, rubs, or gallops Abd: nontender, nondistended Extremities: no calf tenderness, no edema Vascular: 2+ radial pulses bilaterally, 2+ DP pulses bilaterally Neuro: NIH stroke scale of zero although patient is ataxic and cannot walk without assistance Skin: no rashes Psyc: acting appropriately   (all labs ordered are listed, but only abnormal results are displayed) Labs Reviewed  CBC - Abnormal; Notable for the following components:      Result Value   WBC 12.2 (*)    All other components within normal limits  DIFFERENTIAL - Abnormal; Notable for the following components:  Neutro Abs 9.9 (*)    All other components within normal limits  COMPREHENSIVE METABOLIC PANEL WITH GFR - Abnormal; Notable for the following components:   Glucose, Bld 124 (*)    All other components within normal limits  URINALYSIS, ROUTINE W REFLEX MICROSCOPIC - Abnormal; Notable for the following components:   APPearance HAZY (*)    Leukocytes,Ua TRACE (*)    All other components within normal limits  CBG MONITORING, ED - Abnormal; Notable for the following components:   Glucose-Capillary 114 (*)    All other components within normal limits   ETHANOL  PROTIME-INR  APTT  RAPID URINE DRUG SCREEN, HOSP PERFORMED    EKG: EKG Interpretation Date/Time:  Sunday June 21 2024 19:34:34 EDT Ventricular Rate:  71 PR Interval:  180 QRS Duration:  113 QT Interval:  412 QTC Calculation: 448 R Axis:   49  Text Interpretation: Sinus rhythm Incomplete right bundle branch block Confirmed by Ula Barter [phone]) on 06/21/2024 7:42:07 PM  Radiology: CT HEAD CODE STROKE WO CONTRAST Result Date: 06/21/2024 CLINICAL DATA:  Code stroke. Initial evaluation for acute neuro deficit, stroke suspected. EXAM: CT HEAD WITHOUT CONTRAST TECHNIQUE: Contiguous axial images were obtained from the base of the skull through the vertex without intravenous contrast. RADIATION DOSE REDUCTION: This exam was performed according to the departmental dose-optimization program which includes automated exposure control, adjustment of the mA and/or kV according to patient size and/or use of iterative reconstruction technique. COMPARISON:  CT from 05/10/2021. FINDINGS: Brain: Cerebral volume within normal limits. No acute intracranial hemorrhage. No acute large vessel territory infarct. No mass lesion, midline shift or mass effect. No hydrocephalus or extra-axial fluid collection. Vascular: No abnormal hyperdense vessel. Scattered vascular calcifications noted within the carotid siphons. Skull: Scalp soft tissues within normal limits.  Calvarium intact. Sinuses/Orbits: Globes and orbital soft tissues within normal limits. Paranasal sinuses are clear. No mastoid effusion. Other: None. ASPECTS [facility] Stroke Program Early CT Score) - Ganglionic level infarction (caudate, lentiform nuclei, internal capsule, insula, M1-M3 cortex): 7 - Supraganglionic infarction (M4-M6 cortex): 3 Total score (0-10 with 10 being normal): 10 IMPRESSION: Negative head CT. No acute intracranial abnormality. Aspects is 10. Results were called by telephone at the time of interpretation on 06/21/2024 at 7:24 pm  to provider Dyllen Menning , who verbally acknowledged these results. Electronically Signed   By: Morene Hoard M.D.   On: 06/21/2024 19:25     Procedures   Medications Ordered in the ED  meclizine  (ANTIVERT ) tablet 25 mg (25 mg Oral Not Given 06/21/24 2206)  meclizine  (ANTIVERT ) tablet 25 mg (25 mg Oral Given 06/21/24 2003)  ondansetron  (ZOFRAN ) injection 4 mg (4 mg Intravenous Given 06/21/24 2004)  cephALEXin  (KEFLEX ) capsule 500 mg (500 mg Oral Given 06/21/24 2212)                                    Medical Decision Making 68 year old female with past medical history of diabetes and neuropathy as well as remote history of vertigo presenting to the emergency department today with dizziness.  This has started within the past 2-1/2 hours.  I do not see any findings on her exam that are conclusive for BPPV or peripheral vertigo at this time as she does not have any significant past-pointing on finger-to-nose testing.  Will initiate a code stroke here to expedite head CT imaging and have her evaluated by neurology.  Will give the  patient meclizine  as well as Zofran  for her symptoms.  I will reevaluate for ultimate disposition.  The patient's labs are reassuring.  Urinalysis does show some evidence of possible urinary tract infection although it is not terribly convincing.  Given her history of becoming very sick with urinary tract infections will cover her with Keflex .  I did discuss her case with Dr. Lindzen.  He evaluate the patient and felt that she was not a TNK candidate as her symptoms are more consistent with peripheral vertigo.  The patient will be discharged with ENT follow-up with return precautions.  She was feeling better on reassessment.  CRITICAL CARE Performed by: Prentice JONELLE Medicus   Total critical care time: 35 minutes  Critical care time was exclusive of separately billable procedures and treating other patients.  Critical care was necessary to treat or prevent imminent or  life-threatening deterioration.  Critical care was time spent personally by me on the following activities: development of treatment plan with patient and/or surrogate as well as nursing, discussions with consultants, evaluation of patient's response to treatment, examination of patient, obtaining history from patient or surrogate, ordering and performing treatments and interventions, ordering and review of laboratory studies, ordering and review of radiographic studies, pulse oximetry and re-evaluation of patient's condition.   Amount and/or Complexity of Data Reviewed Labs: ordered. Radiology: ordered.  Risk Prescription drug management.        Final diagnoses:  Dizziness  Acute cystitis without hematuria    ED Discharge Orders          Ordered    meclizine  (ANTIVERT ) 25 MG tablet  3 times daily PRN        06/21/24 2204    cephALEXin  (KEFLEX ) 500 MG capsule  2 times daily        06/21/24 2204               Medicus Prentice JONELLE, MD 06/21/24 2344

## 2024-06-21 NOTE — ED Notes (Signed)
 Dr Ula in triage evaluating pt- order to call code stroke for dizziness-no other sx reported

## 2024-06-21 NOTE — ED Notes (Signed)
 Pt arrives to room 9 from CT with Charge RN Kristi. Primary Rn arriving at this time.

## 2024-06-21 NOTE — ED Notes (Signed)
Pt taken to CT via WC

## 2024-06-21 NOTE — ED Notes (Signed)
 Completed NIH assessment with tele neurologist who remains at bedside via electronic code stroke cart

## 2024-06-21 NOTE — ED Notes (Signed)
 AVS provided edp was reviewed with the pt. Pt verbalized understanding with no additional questions at this time. Pt to go home with s/o at bedside. Pt wheelchair to car by NT.

## 2024-06-21 NOTE — Consult Note (Signed)
 [facility] TeleNeurology Consult Services    Date of Service:  06/21/2024     Metrics: Last Known Well: 4:30 PM Symptoms: As per HPI.  Patient is not a candidate for thrombolytic. Presentation is most consistent with peripheral vertigo with NIHSS 0.   Location of the provider: [facility]  Location of the patient: [facility] ED Pre-Morbid Modified Rankin Scale: 3  (from cervical spinal cord injury in November)  This consult was provided via telemedicine with 2-way video and audio communication. The patient/family was informed that care would be provided in this way and agreed to receive care in this manner.   ED Physician notified of diagnostic impression and management plan after completion of the neurological evaluation.    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   - Exam reveals chronic weakness in all 4 extremities but no drift. She has sensory numbness distal to her knees bilaterally that she states is chronic. NIHSS 0.  - CT head: Negative head CT. No acute intracranial abnormality. Aspects is 10.  - Labs:  - WBC elevated at 12.2  - Coags normal - EtOH negative - Impression:  - Acute onset of vertigo. Given NIHSS of 0, the lack of ataxia, nystagmus, skew deviation or double vision, as well as no changes to her chronic pattern of weakness, stroke/TIA is unlikely. Her overall presentation is most consistent with a peripheral vestibulopathy versus subjective dizziness secondary to hypotension. Potential etiologies for a peripheral vestibulopathy include BPPV and Meniere's disease.  - She has previously been diagnosed with a UTI in the setting of vertigo and  syncope and states that her symptoms with her acute presentation today are similar.      Recommendations: - Evaluate for possible UTI. - IVF - Glycemic control - Outpatient ENT evaluation if her workup is negative for a UTI.  - May also benefit from outpatient Neurology evaluation if ENT assessment does not reveal BPPV, vestibular neuritis or Meniere's disease.     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 ED with dizziness that started at 4:30 pm today. States that it gets worse with movement. She has a history of a fall off of a toilet in November of last year in the setting of hypotension secondary to a UTI, with resulting cervical spinal cord injury; she states that she has nerve damage in bilateral arms and legs with weakness and sensory numbness at baseline. She has undergone physical therapy after recovery from the initial operation (cervical fusion) and started being able to walk again with a walker about 2 months ago.   Onset of symptoms was at 4:30 PM, when she suddenly felt light-headed. She went to lie down and upon doing so, started to feel vertiginous. About 1 hour later she started to feel nauseous and then vomited twice. Her husband took her BP, which was 104/58. Her CBG was not taken at that time, but had been in the 120's earlier in the day. Her husband then drove her to the  ED where a Code Stroke was called. The patient felt dizzy again when being moved to the CT table, described as a spinning sensation. BP after CT was 86/45.   She states that she has not experienced any double vision along with her dizziness today, as well as no vision  loss and no worsening of her chronic numbness and weakness. She denies headache.   She does have a remote history of vertigo in the past that lasted for about 1 week before resolving spontaneously; vertigo would worsen with movement. With her November spell, she had vertigo again, which had been associated with fever and chills and impaired her ability to walk, prior to her passing out and injuring her neck, as described above.      Past Medical History: Past Medical History:  Diagnosis Date   Anxiety    Arthritis    Basal cell carcinoma    Charcot's joint of foot    bilateral   Diabetes mellitus without complication (HCC)    Fibromyalgia    Neuropathy       Past Surgical History: Past Surgical History:  Procedure Laterality Date   ACHILLES TENDON REPAIR Left    bone spur Left    --left foot   BREAST SURGERY     benign breast bx--left--fatty tissue   CARPAL TUNNEL RELEASE Bilateral    COLONOSCOPY     COLONOSCOPY WITH PROPOFOL  N/A 09/06/2022   Procedure: COLONOSCOPY WITH PROPOFOL ;  Surgeon: Shaaron Lamar HERO, MD;  Location: [facility];  Service: Endoscopy;  Laterality: N/A;  10:30am, asa 2   DILATION AND CURETTAGE OF UTERUS  2011   for cervical polyp   FOOT SURGERY Left 03/2018   reconstructive surgery   POLYPECTOMY  09/06/2022   Procedure: POLYPECTOMY;  Surgeon: Shaaron Lamar HERO, MD;  Location: [facility];  Service: Endoscopy;;      Medications:  No current facility-administered medications on file prior to encounter.   Current Outpatient Medications on File Prior to Encounter  Medication Sig Dispense Refill   cyclobenzaprine (FLEXERIL) 5 MG tablet Take 5 mg by mouth every 8 (eight) hours as needed.     desonide (DESOWEN) 0.05 % cream Apply 1 Application topically 2 (two) times daily as needed (on eye lids).     doxycycline (VIBRAMYCIN) 100 MG capsule Take 100 mg by mouth 2 (two) times daily.     gabapentin (NEURONTIN) 600 MG tablet Take 300-600 mg by mouth See  admin instructions. Take 300 mg in the afternoon and 600 mg at bedtime     hyoscyamine  (LEVSIN  SL) 0.125 MG SL tablet Place 1 tablet (0.125 mg total) under the tongue every 4 (four) hours as needed. 30 tablet 3   metFORMIN (GLUCOPHAGE) 500 MG tablet Take 500 mg by mouth 2 (two) times daily with a meal.     metolazone (ZAROXOLYN) 5 MG tablet Take 5 mg by mouth once a week.     NON FORMULARY Take 1 tablet by mouth daily. Carbon 98 BX--a fish oil.  Pt. Takes 5x/week     ONETOUCH VERIO test strip daily.     OVER THE COUNTER MEDICATION Take 5 tablets by mouth daily. Chromic Chloride     Polyethyl Glycol-Propyl Glycol (SYSTANE) 0.4-0.3 % SOLN Place 1 drop into both eyes daily as needed (dry eyes). PF orginal        Social History: Rare EtOH No drug use Never smoker   Family History:  Reviewed in Epic   ROS: As per HPI  Anticoagulant use:  No   Antiplatelet use: No   Examination:    BP (!) 145/60 (BP Location: Right Arm)   Pulse 68   Temp 98.2 F (36.8 C) (Oral)   Resp 20   LMP 10/22/2008   SpO2 100%     1A: Level of Consciousness - 0 1B: Ask Month and Age - 0 1C: Blink Eyes & Squeeze Hands - 0 2: Test Horizontal Extraocular Movements - 0  (No nystagmus with central, lateral or upgaze).  3: Test Visual Fields - 0 4: Test Facial Palsy (Use Grimace if Obtunded) - 0 5A: Test Left Arm Motor Drift - 0 (finger, wrist extension and grip weakness bilaterally, worse on the right) 5B: Test Right Arm Motor Drift - 0 6A: Test Left Leg Motor Drift - 0 6B: Test Right Leg Motor Drift - 0 7: Test Limb Ataxia (FNF/Heel-Shin) - 0  (slow FNF on the right, but no ataxia) 8: Test Sensation -  0 (normal fine touch sensation proximally x 4, with chronic peripheral neuropathic sensory loss distal to the knees bilaterally) 9: Test Language/Aphasia - 0 10: Test Dysarthria - 0 11: Test Extinction/Inattention - 0   NIHSS Score: 0     Patient/Family was informed the Neurology Consult would occur  via TeleHealth consult by way of interactive audio and video telecommunications and consented to receiving care in this manner.   Patient is being evaluated for possible acute neurologic impairment and high pretest probability of imminent or life-threatening deterioration. I spent total of 40 minutes providing care to this patient, including time for face to face visit via telemedicine, review of medical records, imaging studies and discussion of findings with providers, the patient and/or family.   Electronically signed: Dr. Roshard Rezabek

## 2024-06-21 NOTE — ED Notes (Signed)
Tele Neurology at bedside

## 2024-06-23 ENCOUNTER — Ambulatory Visit (HOSPITAL_COMMUNITY)

## 2024-06-23 ENCOUNTER — Encounter (HOSPITAL_COMMUNITY): Payer: Self-pay

## 2024-06-25 ENCOUNTER — Ambulatory Visit (HOSPITAL_COMMUNITY): Admitting: Occupational Therapy

## 2024-06-25 ENCOUNTER — Ambulatory Visit (HOSPITAL_COMMUNITY): Attending: Surgery

## 2024-06-25 ENCOUNTER — Encounter (HOSPITAL_COMMUNITY): Payer: Self-pay | Admitting: Occupational Therapy

## 2024-06-25 DIAGNOSIS — M6281 Muscle weakness (generalized): Secondary | ICD-10-CM | POA: Insufficient documentation

## 2024-06-25 DIAGNOSIS — Z7409 Other reduced mobility: Secondary | ICD-10-CM | POA: Insufficient documentation

## 2024-06-25 DIAGNOSIS — G9520 Unspecified cord compression: Secondary | ICD-10-CM | POA: Insufficient documentation

## 2024-06-25 DIAGNOSIS — R278 Other lack of coordination: Secondary | ICD-10-CM | POA: Insufficient documentation

## 2024-06-25 DIAGNOSIS — R29818 Other symptoms and signs involving the nervous system: Secondary | ICD-10-CM | POA: Diagnosis present

## 2024-06-25 NOTE — Therapy (Signed)
 OUTPATIENT PHYSICAL THERAPY NEURO TREATMENT/PROGRESS NOTE Progress Note Reporting Period 04/27/24 to 0/01/2024  See note below for Objective Data and Assessment of Progress/Goals.       Patient Name: Angela Norman MRN: [id number] DOB:Aug 11, 1956, 68 y.o., female Today's Date: 06/25/2024   PCP: Sebastian Othel GAILS, FNP REFERRING PROVIDER: Edna Norris, MD  END OF SESSION:  PT End of Session - 06/25/24 1336     Visit Number 11    Number of Visits 24    Date for PT Re-Evaluation 07/28/24    Authorization Type Medicare A & B    PT Start Time 1338    PT Stop Time 1420    PT Time Calculation (min) 42 min    Equipment Utilized During Treatment Gait belt    Activity Tolerance Patient tolerated treatment well    Behavior During Therapy WFL for tasks assessed/performed              Past Medical History:  Diagnosis Date   Anxiety    Arthritis    Basal cell carcinoma    Charcot's joint of foot    bilateral   Diabetes mellitus without complication (HCC)    Fibromyalgia    Neuropathy    Past Surgical History:  Procedure Laterality Date   ACHILLES TENDON REPAIR Left    bone spur Left    --left foot   BREAST SURGERY     benign breast bx--left--fatty tissue   CARPAL TUNNEL RELEASE Bilateral    COLONOSCOPY     COLONOSCOPY WITH PROPOFOL  N/A 09/06/2022   Procedure: COLONOSCOPY WITH PROPOFOL ;  Surgeon: Shaaron Lamar HERO, MD;  Location: [facility];  Service: Endoscopy;  Laterality: N/A;  10:30am, asa 2   DILATION AND CURETTAGE OF UTERUS  2011   for cervical polyp   FOOT SURGERY Left 03/2018   reconstructive surgery   POLYPECTOMY  09/06/2022   Procedure: POLYPECTOMY;  Surgeon: Shaaron Lamar HERO, MD;  Location: [facility];  Service: Endoscopy;;   Patient Active Problem List   Diagnosis Date Noted   Anxiety 06/28/2022   Arthritis 06/28/2022   Body fluid retention 06/28/2022   Hyperglycemia due to type 2 diabetes mellitus (HCC) 06/28/2022   Mixed  hyperlipidemia 06/28/2022   Fibromyalgia 06/28/2022   Cough 10/25/2021   Musculoskeletal pain 05/21/2012   Stiffness of joint, not elsewhere classified, ankle and foot 08/13/2011   Difficulty walking 08/13/2011   Ankle weakness 08/13/2011    ONSET DATE: November 2024  REFERRING DIAG: compression of spinal cord   THERAPY DIAG:  Other lack of coordination  Other symptoms and signs involving the nervous system  Compression of spinal cord (HCC)  Impaired functional mobility, balance, gait, and endurance  Muscle weakness (generalized)  Rationale for Evaluation and Treatment: Rehabilitation  SUBJECTIVE:  SUBJECTIVE STATEMENT: Had to go to the ED over the weekend secondary to sudden onset of vertigo.  Thought was likely related to a UTI.  They ruled out stroke. Gave her meclinizine and zofran  to take.  Taking the last antibiotic today.  Right leg kind of gave out a couple times Friday and Sat but has not done that again since. Feels overall that balance has improved minimally but her strength and endurance have greatly improved. Received the butler day before yesterday but her leg has not been swelling the past 2 days.      Eval:  Pt reported all this started with UTI that caused fracture of neck and indicated as C4-C7 fusion. Pt had multiple bouts of acute and inpatient rehab stays due to complications. Significant BP issues throughout this time. Had stay in multiple facilities. Pt reported various changes in functional status, was requiring assist for ADLs and mobility since April 28th. Since then, pt has been home with Home Health services. Pt continues to report numbness in BUE and poor grip strength. Pt reports neuropathy in BLE. Prior to onset of November '24 was walking daily for 45-60 minutes.   Pt  has CT scan for nodule on Lung on 04/28/24.  Pt accompanied by: self  PERTINENT HISTORY:  Cervical Myelopathy with ACDF 4-7 in November 2024\ Left ankle surgery x 3  PAIN:  Are you having pain? No and chronic pain in left shoulder  PRECAUTIONS: None  RED FLAGS: Bowel or bladder incontinence: Yes: SCI   WEIGHT BEARING RESTRICTIONS: No  FALLS: Has patient fallen in last 6 months? Yes. Number of falls 1-2   PATIENT GOALS:  want to walk with AD well and with confidence and strengthening RUE.  OBJECTIVE:  Note: Objective measures were completed at Evaluation unless otherwise noted.  DIAGNOSTIC FINDINGS:   COGNITION: Overall cognitive status: Within functional limits for tasks assessed   SENSATION: Light touch: decreased light touch from lateral malleolus distally.   LOWER EXTREMITY ROM:     Active  Right Eval Left Eval  Hip flexion    Hip extension    Hip abduction    Hip adduction    Hip internal rotation    Hip external rotation    Knee flexion    Knee extension    Ankle dorsiflexion    Ankle plantarflexion    Ankle inversion    Ankle eversion     (Blank rows = not tested)  LOWER EXTREMITY MMT:    MMT Right Eval Left Eval Right 06/25/24 Left 06/25/24  Hip flexion      Hip extension      Hip abduction      Hip adduction      Hip internal rotation      Hip external rotation      Knee flexion      Knee extension 4- 4+ 4+ 5  Ankle dorsiflexion      Ankle plantarflexion      Ankle inversion      Ankle eversion      (Blank rows = not tested)   TRANSFERS: Sit to stand: Modified independence  Assistive device utilized: Environmental consultant - 2 wheeled     Stand to sit: Modified independence  Assistive device utilized: Environmental consultant - 2 wheeled     Chair to chair: SBA  Assistive device utilized: Environmental consultant - 2 wheeled       GAIT: Findings: Gait Characteristics: step through pattern, decreased arm swing- Right, decreased arm swing- Left, decreased step  length- Right, decreased  step length- Left, decreased hip/knee flexion- Right, decreased hip/knee flexion- Left, Right foot flat, Left foot flat, and narrow BOS, Distance walked: 28ft, Assistive device utilized:Walker - 2 wheeled, Level of assistance: Modified independence, and Comments:    FUNCTIONAL TESTS:  TUG: 25.71 seconds  DGI: 05/13/24: 8 / 24  L SLS: <0.1 seconds  R SLS: <0.1 seconds  Norms: 18-39  F: 43.5 seconds  M: 43.2 seconds 40-49  F: 40.4 seconds  M: 40.1 seconds 50-59  F: 36 seconds  M: 38.1 seconds 60-69  F: 25.1 seconds  M: 28.7 seconds 70-79  F: 11.3 seconds  M: 18.3 seconds  PATIENT SURVEYS:  ABC Scale:950 / 1600 = 59.4 %                                                                                                                              TREATMENT DATE:  06/25/24 Progress note ABC 58.8% MMT's knee extension left 5; right 4+ TUG 21.38 sec with RW SLS left 2 and right 1  DGI 1. Gait level surface (2) Mild Impairment: Walks 20', uses assistive devices, slower speed, mild gait deviations. 2. Change in gait speed (2) Mild Impairment: Is able to change speed but demonstrates mild gait deviations, or not gait deviations but unable to achieve a significant change in velocity, or uses an assistive device. 3. Gait with horizontal head turns (2) Mild Impairment: Performs head turns smoothly with slight change in gait velocity, i.e., minor disruption to smooth gait path or uses walking aid. 4. Gait with vertical head turns (2) Mild Impairment: Performs head turns smoothly with slight change in gait velocity, i.e., minor disruption to smooth gait path or uses walking aid. 5. Gait and pivot turn (2) Mild Impairment: Pivot turns safely in > 3 seconds and stops with no loss of balance. 6. Step over obstacle (1) Moderate Impairment: Is able to step over box but must stop, then step over. May require verbal cueing. 7. Step around obstacles (2) Mild Impairment: Is able to step  around both cones, but must slow down and adjust steps to clear cones. 8. Stairs (1) Moderate Impairment: Two feet to a stair, must use rail.  TOTAL SCORE: 14 / 24   06/18/2024  Therapeutic Exercise: -Nustep seat 9 x 5' level 6, pt avgs 50 SPM -Seated hamstring curls, 2 sets of 10 reps, plate 4>plate 5, pt cued for increased ROM and eccentric control -Standing hip abd and ext; 2 sets 10 reps w/ 3# , bilaterally, pt cued for upright trunk and maintaining of neutral spine  Therapeutic Activity: -Sit to stands from standard chair - 6 reps with tidal tank and push out; pt cued for core activation -Sled push, no weight, 2 laps, 50 foot laps, pt cued for consistent movement of sled and educated on functionality -Step ups, 1 set of 6 reps, 3lb ankle weights, pt cued for sequencing -Lateral step up and overs, 6  inch step, 3lb ankle weights, 1 set of 5 reps, bilaterally, pt requires BUE support   06/16/24 Adjusted RW to appropriate height (1 notch down) Discussion of adjustafit versus compression stocking Standing with no UE assist in // bars; CGA for safety Stagger stance x 30 each Stagger stance with front foot on foam x 30 each Both feet on foam x 30 Both feet on foam with rhythmic stab x 30 Sit to stand to arm raise x 10; no UE assist 6 box toe taps with 1 UE assist 2 x 10 each 4# LAQ's 2 x 10 each with 2 hold Nustep seat 9 x 5' level 5 to end treatment    06/11/24: Nustep seat 9 x 5' dynamic warm up [employer] L5 UE/LE Educated techniques for donning compression garments, difficulty due to poor grasp  and tightness of compression sock, shown butler as option.  Reports increased Rt foot tenderness with socks on Leg press 10x 2 3Pl Toe tapping 4# on ankles alternating 6in step 2x 10 BUE--> 1 HHA 6in hurdels 4# forward and lateral 2RT with HHA    PATIENT EDUCATION: Education details: PT Evaluation, findings, prognosis, frequency, attendance policy. Person educated:  Patient Education method: Explanation and Demonstration Education comprehension: verbalized understanding  HOME EXERCISE PROGRAM: Access Code: [email or web address] URL: [email or web address] Date: 05/14/2024 Prepared by: Augustin Mclean  Exercises - Sit to Stand  - 1 x daily - 7 x weekly - 3 sets - 10 reps - Standing March with Counter Support  - 1 x daily - 7 x weekly - 3 sets - 10 reps - Standing Hip Abduction with Counter Support  - 1 x daily - 7 x weekly - 3 sets - 10 reps - Side Stepping with Resistance at Thighs and Counter Support  - 1 x daily - 7 x weekly - 3 sets - 10 reps  GOALS: Goals reviewed with patient? No  SHORT TERM GOALS: Target date: 05/25/2024  Pt will be independent with HEP in order to demonstrate participation in Physical Therapy POC.  Baseline: Goal status: MET  2.  Pt will improve ABC score by 15% in order to demonstrate improved pain with functional goals and outcomes. Baseline:  Goal status: IN PROGRESS  LONG TERM GOALS: Target date: 06/22/2024  Pt will increase DGI score by > MCID in order to demonstrate improved functional safety and balance skills in ADL/mobility.   Baseline: see objective. 06/25/24  14/24 Goal status: IN PROGRESS  2.  Pt will improve TUG by least 3 seconds in order to demonstrate improved functional mobility capacity in community setting.  Baseline: see objective. 06/25/24 21.38 sec with RW Goal status: MET  3.  Pt will improve ABC score by 30% in order to demonstrate improved pain with functional goals and outcomes. Baseline: see objective.  06/25/24 58.8% Goal status: IN PROGRESS  4.  Pt will improve single leg balance by at least 3 seconds bilaterally in order to demonstrate improved capacity, safety, and overall quality of movement to optimize function.  Baseline: see objective. 06/25/24 2 on left and 1 on right Goal status: IN PROGRESS  ASSESSMENT:  CLINICAL IMPRESSION: Progress note today; patient with good progress with  TUG and DGI; met goal for TUG.  ABC score about the same; minimal improvement with SLS.  Patient will benefit from continued skilled therapy services to address remaining unmet and partially  met goals.   Eval:  Patient is a 68y.o. female who was seen today for physical therapy evaluation and treatment  for 'compression of spinal cord.'  Patient with history of myelopathy C5 spinal fracture, with A C4 through C7 cervical fusion.  Patient presents with significant functional mobility deficits, safety concerns, increased falls risk, reduced ambulation capacity at due to muscle weakness, decreased activity tolerance, balance deficits in setting of previous cervical myelopathy pt will benefit from skilled Physical Therapy services to address deficits/limitations in order to improve functional and QOL.    OBJECTIVE IMPAIRMENTS: Abnormal gait, decreased activity tolerance, decreased balance, decreased mobility, difficulty walking, decreased strength, and postural dysfunction.   ACTIVITY LIMITATIONS: carrying, lifting, bending, standing, squatting, stairs, transfers, bed mobility, and locomotion level  PARTICIPATION LIMITATIONS: meal prep, cleaning, laundry, community activity, occupation, and yard work  PERSONAL FACTORS: Age are also affecting patient's functional outcome.   REHAB POTENTIAL: Excellent  CLINICAL DECISION MAKING: Stable/uncomplicated  EVALUATION COMPLEXITY: Low  PLAN:  PT FREQUENCY: 2x/week  PT DURATION: 4 weeks  PLANNED INTERVENTIONS: 97164- PT Re-evaluation, 97750- Physical Performance Testing, 97110-Therapeutic exercises, 97530- Therapeutic activity, [id number]- Neuromuscular re-education, 97535- Self Care, 02859- Manual therapy, [phone]- Gait training, [phone]- Orthotic Initial, Patient/Family education, Balance training, Stair training, Taping, Joint mobilization, Joint manipulation, Spinal manipulation, Spinal mobilization, Cryotherapy, and Moist heat  PLAN FOR NEXT SESSION: Functional  mobility, strengthening, ambulation, etc. Extend PT 2 week 4 to address remaining unmet and partially met goals.   2:32 PM, 06/25/24 Kippy Gohman Small Ashey [place] MPT [facility] [place] [phone]

## 2024-06-25 NOTE — Therapy (Unsigned)
 OUTPATIENT OCCUPATIONAL THERAPY NEURO TREATMENT NOTE  Patient Name: Angela Norman MRN: [id number] DOB:11-20-1955, 68 y.o., female Today's Date: 06/26/2024     PCP: Shona Norleen PEDLAR, MD REFERRING PROVIDER: Hillman Amour, MD  END OF SESSION:  OT End of Session - 06/25/24 1517     Visit Number 11    Number of Visits 17    Date for OT Re-Evaluation 07/17/24    Authorization Type Medicare Part A and B    Progress Note Due on Visit 19    OT Start Time 1433    OT Stop Time 1517    OT Time Calculation (min) 44 min    Activity Tolerance Patient tolerated treatment well    Behavior During Therapy WFL for tasks assessed/performed            Past Medical History:  Diagnosis Date   Anxiety    Arthritis    Basal cell carcinoma    Charcot's joint of foot    bilateral   Diabetes mellitus without complication (HCC)    Fibromyalgia    Neuropathy    Past Surgical History:  Procedure Laterality Date   ACHILLES TENDON REPAIR Left    bone spur Left    --left foot   BREAST SURGERY     benign breast bx--left--fatty tissue   CARPAL TUNNEL RELEASE Bilateral    COLONOSCOPY     COLONOSCOPY WITH PROPOFOL  N/A 09/06/2022   Procedure: COLONOSCOPY WITH PROPOFOL ;  Surgeon: Shaaron Lamar HERO, MD;  [facility]: [facility];  Service: Endoscopy;  Laterality: N/A;  10:30am, asa 2   DILATION AND CURETTAGE OF UTERUS  2011   for cervical polyp   FOOT SURGERY Left 03/2018   reconstructive surgery   POLYPECTOMY  09/06/2022   Procedure: POLYPECTOMY;  Surgeon: Shaaron Lamar HERO, MD;  [facility]: [facility];  Service: Endoscopy;;   Patient Active Problem List   Diagnosis Date Noted   Anxiety 06/28/2022   Arthritis 06/28/2022   Body fluid retention 06/28/2022   Hyperglycemia due to type 2 diabetes mellitus (HCC) 06/28/2022   Mixed hyperlipidemia 06/28/2022   Fibromyalgia 06/28/2022   Cough 10/25/2021   Musculoskeletal pain 05/21/2012   Stiffness of joint, not elsewhere classified, ankle and  foot 08/13/2011   Difficulty walking 08/13/2011   Ankle weakness 08/13/2011    ONSET DATE: 08/2023  REFERRING DIAG:  G95.20 (ICD-10-CM) - Unspecified cord compression  R25.2 (ICD-10-CM) - Spastic   THERAPY DIAG:  Other lack of coordination  Other symptoms and signs involving the nervous system  Rationale for Evaluation and Treatment: Rehabilitation  SUBJECTIVE:   SUBJECTIVE STATEMENT: S: I had to go to the hospital over the weekend.  PERTINENT HISTORY: Pt reported all this started with UTI that caused fracture f neck and indicated as C4-C7 fusion. Pt had multiple bouts of acute and inpatient rehab stays due to complications. Significant BP issues throughout this time. Had stay in multiple facilities. Pt reported various changes in functional status, was requiring assist for ADLs and mobility since April 28th. Since then, pt has been home with Home Health services. Pt continues to report numbness in BUE and poor grip strength. Pt reports neuropathy in BLE. Prior to onset of November '24 was walking daily for 45-60 minutes.   PRECAUTIONS: Fall  WEIGHT BEARING RESTRICTIONS: No  PAIN:  Are you having pain? No  FALLS: Has patient fallen in last 6 months? No  LIVING ENVIRONMENT: Lives with: lives with their spouse Lives in: House/apartment Stairs: Pt lives in  the basement with no stairs Has following equipment at home: Walker - 2 wheeled and shower chair  PLOF: Independent  PATIENT GOALS: To improve mobility and strengthening  OBJECTIVE:  Note: Objective measures were completed at Evaluation unless otherwise noted.  HAND DOMINANCE: Right  ADLs: Overall ADLs: Pt has difficulty with all small object manipulation, including but not limited to buttons, shoe laces, and zippers. Additionally she has max difficulty with cooking and cleaning due to weakness in both hands and wrists.   MOBILITY STATUS: Hx of falls, difficulty with turns, and difficulty carrying objections with  ambulation  POSTURE COMMENTS:  rounded shoulders Sitting balance: Moves/returns truncal midpoint 1-2 inches in multiple planes  ACTIVITY TOLERANCE: Activity tolerance: Fair tolerance - fatigues quicker than normal  FUNCTIONAL OUTCOME MEASURES: Quick Dash: 47.73  UPPER EXTREMITY ROM:    All ROM is WFL  UPPER EXTREMITY MMT:     MMT Right eval Left eval  Shoulder flexion 5/5 5/5  Shoulder abduction 5/5 5/5  Shoulder internal rotation 5/5 5/5  Shoulder external rotation 5/5 5/5  Elbow flexion 5/5 5/5  Elbow extension 5/5 5/5  Wrist flexion 4/5 4+/5  Wrist extension 4-/5 4/5  Wrist ulnar deviation 4-/5 4/5  Wrist radial deviation 4-/5 4-/5  Wrist pronation 4/5 4+/5  Wrist supination 4/5 4/5  (Blank rows = not tested)  HAND FUNCTION: Grip strength: Right: 20 lbs; Left: 27 lbs, Lateral pinch: Right: 5 lbs, Left: 9 lbs, and 3 point pinch: Right: 5 lbs, Left: 4 lbs  COORDINATION: 9 Hole Peg test: Right: -- sec; Left: -- sec  SENSATION: Pt has full numbness along median nerve of R hand as well as dulled sensation and tingling in BUE up to elbows.   EDEMA: No swelling noted  OBSERVATIONS: adducted thumbs and 80% grip                                                                                                                             TREATMENT DATE:   06/25/24 -Wrist ROM: flexion, extension, ulnar/radial deviation, supination/Pronation, x15 -Therabar: Teal, extension/flexion twists, ulnar/radial deviation, supination/pronation, x15 -Coins: picking up 10 coins at a time, placing one at a time in piggy bank -Pinch Tree: red, green, blue resistance clip, tripod up, lateral down - unable to do blue resistance clips with RUE  06/18/24 -Shoulder A/ROM: sitting-protraction, flexion, abduction, horizontal abduction, er, 15 reps -Proximal Shoulder Exercises: paddles, criss cross, circles both directions, 10 reps -Hand gripper, BUE: Right-large and medium beads with gripper  vertical at 25#; Left-large and medium beads gripper horizontal at 25# -Wrist strengthening: 2#-extension, flexion, ulnar/radial deviation, 10 reps -Pinch strengthening: pt using red clothespin and 3 point pinch to grasp and stack 4 towers of 5 sponges with left hand, removing and replacing in bucket with right hand and lateral pinch; unable to maintain 3 point pinch with right hand 3rd digit -Theraputty: red-flatten, cutting circles with pvc pipe, rolling, gripping, pinching  06/16/24 -Shoulder Strengthening: 1#,  flexion, abduction, protraction, horizontal abduction, er/IR, x15 -Proximal Shoulder Exercises: paddles, criss cross, circles both directions, x10 -Strengthening: 2#, bicep curls, pronated curls, x12 -Grooved peg board, 10 pegs each hand - increased time  06/12/24 -Theraputty: red putty, roll into ball, flatten into pancake, roll into log, tripod pinch, lateral pinch, roll into ball, squeeze -Therabar: yellow, extension/flexion twists, ulnar/radial deviation, supination/pronation, x10 -Large peg board w/pattern -Gripper: 15#, 11#, picking up 10 medium beads each -Theraputty: red putty, 10 beads, roll putty into a ball, pinch and pull to find 5 beads each hand    PATIENT EDUCATION: Education details: Coin work Person educated: Patient Education method: Programmer, multimedia, Facilities manager, and Handouts Education comprehension: verbalized understanding and returned demonstration  HOME EXERCISE PROGRAM: 8/1: Wrist ROM and Digit ROM 8/8: Shoulder A/ROM 8/20: Scapular Strengthening 9/4: Coin work   GOALS: Goals reviewed with patient? Yes  SHORT TERM GOALS: Target date: 06/19/24  Pt will be provided and educated on a comprehensive HEP for BUE mobility in order to complete ADL's and IADL's independently.   Goal status: IN PROGRESS  2.  Pt will improve BUE strength to 5/5 in order to lift and carry items during cooking and cleaning tasks.   Goal status: IN PROGRESS  3.  Pt will  improve BUE grip by 15# and pinch by 3# in order to grasp and hold pots and pans.  Goal status: IN PROGRESS  4.  Pt will improve BUE coordination by completing 9 hole peg test in 45 or less in order to manipulate and complete fine motor tasks.   Goal status: IN PROGRESS   ASSESSMENT:  CLINICAL IMPRESSION:  This session pt presents with decreased activity tolerance. She was in the ED over the weekend with a UTI and reports feeling off ever since. Due to fatigue session focused on wrist strength and fine motor skills. She continues to have increased weakness and poor dexterity on her RUE, however her LUE is improving well. OT providing verbal and tactile cuing for positioning and technique throughout session.    PERFORMANCE DEFICITS: in functional skills including ADLs, IADLs, coordination, dexterity, sensation, ROM, strength, pain, fascial restrictions, Fine motor control, Gross motor control, body mechanics, and UE functional use.    PLAN:  OT FREQUENCY: 2x/week  OT DURATION: 4 weeks  PLANNED INTERVENTIONS: 97168 OT Re-evaluation, 97535 self care/ADL training, 02889 therapeutic exercise, 97530 therapeutic activity, 97112 neuromuscular re-education, 97140 manual therapy, 97035 ultrasound, 97018 paraffin, 02989 moist heat, 97032 electrical stimulation (manual), passive range of motion, functional mobility training, energy conservation, coping strategies training, patient/family education, and DME and/or AE instructions  CONSULTED AND AGREED WITH PLAN OF CARE: Patient  PLAN FOR NEXT SESSION: Wrist and Digit ROM, strengthening, coordination tasks   Valentin Nightingale, OTR/L [phone] 06/26/2024, 8:18 AM

## 2024-06-30 ENCOUNTER — Encounter (HOSPITAL_COMMUNITY): Payer: Self-pay

## 2024-06-30 ENCOUNTER — Ambulatory Visit (HOSPITAL_COMMUNITY)

## 2024-06-30 ENCOUNTER — Encounter (HOSPITAL_COMMUNITY): Payer: Self-pay | Admitting: Occupational Therapy

## 2024-06-30 ENCOUNTER — Ambulatory Visit (HOSPITAL_COMMUNITY): Admitting: Occupational Therapy

## 2024-06-30 DIAGNOSIS — R278 Other lack of coordination: Secondary | ICD-10-CM

## 2024-06-30 DIAGNOSIS — M6281 Muscle weakness (generalized): Secondary | ICD-10-CM

## 2024-06-30 DIAGNOSIS — G9520 Unspecified cord compression: Secondary | ICD-10-CM

## 2024-06-30 DIAGNOSIS — R29818 Other symptoms and signs involving the nervous system: Secondary | ICD-10-CM

## 2024-06-30 DIAGNOSIS — Z7409 Other reduced mobility: Secondary | ICD-10-CM

## 2024-06-30 NOTE — Therapy (Signed)
 OUTPATIENT PHYSICAL THERAPY NEURO TREATMENT   Patient Name: Angela Norman MRN: [id number] DOB:1956/07/23, 68 y.o., female Today's Date: 06/30/2024   PCP: Sebastian Othel GAILS, FNP REFERRING PROVIDER: Edna Norris, MD  END OF SESSION:  PT End of Session - 06/30/24 1526     Visit Number 12    Number of Visits 24    Date for PT Re-Evaluation 07/28/24    Authorization Type Medicare A & B    PT Start Time 1525    PT Stop Time 1603    PT Time Calculation (min) 38 min    Equipment Utilized During Treatment Gait belt    Activity Tolerance Patient tolerated treatment well    Behavior During Therapy WFL for tasks assessed/performed              Past Medical History:  Diagnosis Date   Anxiety    Arthritis    Basal cell carcinoma    Charcot's joint of foot    bilateral   Diabetes mellitus without complication (HCC)    Fibromyalgia    Neuropathy    Past Surgical History:  Procedure Laterality Date   ACHILLES TENDON REPAIR Left    bone spur Left    --left foot   BREAST SURGERY     benign breast bx--left--fatty tissue   CARPAL TUNNEL RELEASE Bilateral    COLONOSCOPY     COLONOSCOPY WITH PROPOFOL  N/A 09/06/2022   Procedure: COLONOSCOPY WITH PROPOFOL ;  Surgeon: Shaaron Lamar HERO, MD;  Location: [facility];  Service: Endoscopy;  Laterality: N/A;  10:30am, asa 2   DILATION AND CURETTAGE OF UTERUS  2011   for cervical polyp   FOOT SURGERY Left 03/2018   reconstructive surgery   POLYPECTOMY  09/06/2022   Procedure: POLYPECTOMY;  Surgeon: Shaaron Lamar HERO, MD;  Location: [facility];  Service: Endoscopy;;   Patient Active Problem List   Diagnosis Date Noted   Anxiety 06/28/2022   Arthritis 06/28/2022   Body fluid retention 06/28/2022   Hyperglycemia due to type 2 diabetes mellitus (HCC) 06/28/2022   Mixed hyperlipidemia 06/28/2022   Fibromyalgia 06/28/2022   Cough 10/25/2021   Musculoskeletal pain 05/21/2012   Stiffness of joint, not elsewhere  classified, ankle and foot 08/13/2011   Difficulty walking 08/13/2011   Ankle weakness 08/13/2011    ONSET DATE: November 2024  REFERRING DIAG: compression of spinal cord   THERAPY DIAG:  Compression of spinal cord (HCC)  Impaired functional mobility, balance, gait, and endurance  Muscle weakness (generalized)  Rationale for Evaluation and Treatment: Rehabilitation  SUBJECTIVE:  SUBJECTIVE STATEMENT: Urine test complete at PCP yesterday to assure UTI is no longer.  No reports of pain or recent fall.  Pt arrived wearing compression socks on Rt LE, stated it was a workout putting on with butler.  Stated she walked around house wearing 2# on ankle for 17 laps at home.  Eval:  Pt reported all this started with UTI that caused fracture of neck and indicated as C4-C7 fusion. Pt had multiple bouts of acute and inpatient rehab stays due to complications. Significant BP issues throughout this time. Had stay in multiple facilities. Pt reported various changes in functional status, was requiring assist for ADLs and mobility since April 28th. Since then, pt has been home with Home Health services. Pt continues to report numbness in BUE and poor grip strength. Pt reports neuropathy in BLE. Prior to onset of November '24 was walking daily for 45-60 minutes.   Pt has CT scan for nodule on Lung on 04/28/24.  Pt accompanied by: self  PERTINENT HISTORY:  Cervical Myelopathy with ACDF 4-7 in November 2024\ Left ankle surgery x 3  PAIN:  Are you having pain? No and chronic pain in left shoulder  PRECAUTIONS: None  RED FLAGS: Bowel or bladder incontinence: Yes: SCI   WEIGHT BEARING RESTRICTIONS: No  FALLS: Has patient fallen in last 6 months? Yes. Number of falls 1-2   PATIENT GOALS:  want to walk with AD  well and with confidence and strengthening RUE.  OBJECTIVE:  Note: Objective measures were completed at Evaluation unless otherwise noted.  DIAGNOSTIC FINDINGS:   COGNITION: Overall cognitive status: Within functional limits for tasks assessed   SENSATION: Light touch: decreased light touch from lateral malleolus distally.   LOWER EXTREMITY ROM:     Active  Right Eval Left Eval  Hip flexion    Hip extension    Hip abduction    Hip adduction    Hip internal rotation    Hip external rotation    Knee flexion    Knee extension    Ankle dorsiflexion    Ankle plantarflexion    Ankle inversion    Ankle eversion     (Blank rows = not tested)  LOWER EXTREMITY MMT:    MMT Right Eval Left Eval Right 06/25/24 Left 06/25/24  Hip flexion      Hip extension      Hip abduction      Hip adduction      Hip internal rotation      Hip external rotation      Knee flexion      Knee extension 4- 4+ 4+ 5  Ankle dorsiflexion      Ankle plantarflexion      Ankle inversion      Ankle eversion      (Blank rows = not tested)   TRANSFERS: Sit to stand: Modified independence  Assistive device utilized: Environmental consultant - 2 wheeled     Stand to sit: Modified independence  Assistive device utilized: Environmental consultant - 2 wheeled     Chair to chair: SBA  Assistive device utilized: Environmental consultant - 2 wheeled       GAIT: Findings: Gait Characteristics: step through pattern, decreased arm swing- Right, decreased arm swing- Left, decreased step length- Right, decreased step length- Left, decreased hip/knee flexion- Right, decreased hip/knee flexion- Left, Right foot flat, Left foot flat, and narrow BOS, Distance walked: 39ft, Assistive device utilized:Walker - 2 wheeled, Level of assistance: Modified independence, and Comments:    FUNCTIONAL TESTS:  TUG: 25.71 seconds  DGI: 05/13/24: 8 / 24  L SLS: <0.1 seconds  R SLS: <0.1 seconds  Norms: 18-39  F: 43.5 seconds  M: 43.2 seconds 40-49  F: 40.4 seconds  M: 40.1  seconds 50-59  F: 36 seconds  M: 38.1 seconds 60-69  F: 25.1 seconds  M: 28.7 seconds 70-79  F: 11.3 seconds  M: 18.3 seconds  PATIENT SURVEYS:  ABC Scale:950 / 1600 = 59.4 %                                                                                                                              TREATMENT DATE:  06/30/24:   Nustep [place] UE/LE x 5 min L3 resistance Standing inside // bars:  6 box toe taps with 1 UE assist 2 x 10 each 4# each ankle  Lateral toe tapping between 6 and 8in step height with 4# each ankle  Vector stance with BUE assist 3x 5  Sidestep inside // bars with 4# on ankle 5RT  Squat front of chair 10x   Tandem stance 1x 30, 2x 30 on foam with intermittent HHA   06/25/24 Progress note ABC 58.8% MMT's knee extension left 5; right 4+ TUG 21.38 sec with RW SLS left 2 and right 1  DGI 1. Gait level surface (2) Mild Impairment: Walks 20', uses assistive devices, slower speed, mild gait deviations. 2. Change in gait speed (2) Mild Impairment: Is able to change speed but demonstrates mild gait deviations, or not gait deviations but unable to achieve a significant change in velocity, or uses an assistive device. 3. Gait with horizontal head turns (2) Mild Impairment: Performs head turns smoothly with slight change in gait velocity, i.e., minor disruption to smooth gait path or uses walking aid. 4. Gait with vertical head turns (2) Mild Impairment: Performs head turns smoothly with slight change in gait velocity, i.e., minor disruption to smooth gait path or uses walking aid. 5. Gait and pivot turn (2) Mild Impairment: Pivot turns safely in > 3 seconds and stops with no loss of balance. 6. Step over obstacle (1) Moderate Impairment: Is able to step over box but must stop, then step over. May require verbal cueing. 7. Step around obstacles (2) Mild Impairment: Is able to step around both cones, but must slow down and adjust steps to clear cones. 8.  Stairs (1) Moderate Impairment: Two feet to a stair, must use rail.  TOTAL SCORE: 14 / 24   06/18/2024  Therapeutic Exercise: -Nustep seat 9 x 5' level 6, pt avgs 50 SPM -Seated hamstring curls, 2 sets of 10 reps, plate 4>plate 5, pt cued for increased ROM and eccentric control -Standing hip abd and ext; 2 sets 10 reps w/ 3# , bilaterally, pt cued for upright trunk and maintaining of neutral spine  Therapeutic Activity: -Sit to stands from standard chair - 6 reps with tidal tank and push out; pt cued for core activation -Sled  push, no weight, 2 laps, 50 foot laps, pt cued for consistent movement of sled and educated on functionality -Step ups, 1 set of 6 reps, 3lb ankle weights, pt cued for sequencing -Lateral step up and overs, 6 inch step, 3lb ankle weights, 1 set of 5 reps, bilaterally, pt requires BUE support   06/16/24 Adjusted RW to appropriate height (1 notch down) Discussion of adjustafit versus compression stocking Standing with no UE assist in // bars; CGA for safety Stagger stance x 30 each Stagger stance with front foot on foam x 30 each Both feet on foam x 30 Both feet on foam with rhythmic stab x 30 Sit to stand to arm raise x 10; no UE assist 6 box toe taps with 1 UE assist 2 x 10 each 4# LAQ's 2 x 10 each with 2 hold Nustep seat 9 x 5' level 5 to end treatment    06/11/24: Nustep seat 9 x 5' dynamic warm up [employer] L5 UE/LE Educated techniques for donning compression garments, difficulty due to poor grasp  and tightness of compression sock, shown butler as option.  Reports increased Rt foot tenderness with socks on Leg press 10x 2 3Pl Toe tapping 4# on ankles alternating 6in step 2x 10 BUE--> 1 HHA 6in hurdels 4# forward and lateral 2RT with HHA    PATIENT EDUCATION: Education details: PT Evaluation, findings, prognosis, frequency, attendance policy. Person educated: Patient Education method: Explanation and Demonstration Education  comprehension: verbalized understanding  HOME EXERCISE PROGRAM: Access Code: [email or web address] URL: [email or web address] Date: 05/14/2024 Prepared by: Augustin Mclean  Exercises - Sit to Stand  - 1 x daily - 7 x weekly - 3 sets - 10 reps - Standing March with Counter Support  - 1 x daily - 7 x weekly - 3 sets - 10 reps - Standing Hip Abduction with Counter Support  - 1 x daily - 7 x weekly - 3 sets - 10 reps - Side Stepping with Resistance at Thighs and Counter Support  - 1 x daily - 7 x weekly - 3 sets - 10 reps  06/30/24: - Squat with Chair and Counter Support  - 2 x daily - 7 x weekly - 1 sets - 10 reps - Standing Tandem Balance with Counter Support  - 1 x daily - 7 x weekly - 1 sets - 3 reps - 30 hold  GOALS: Goals reviewed with patient? No  SHORT TERM GOALS: Target date: 05/25/2024  Pt will be independent with HEP in order to demonstrate participation in Physical Therapy POC.  Baseline: Goal status: MET  2.  Pt will improve ABC score by 15% in order to demonstrate improved pain with functional goals and outcomes. Baseline:  Goal status: IN PROGRESS  LONG TERM GOALS: Target date: 06/22/2024  Pt will increase DGI score by > MCID in order to demonstrate improved functional safety and balance skills in ADL/mobility.   Baseline: see objective. 06/25/24  14/24 Goal status: IN PROGRESS  2.  Pt will improve TUG by least 3 seconds in order to demonstrate improved functional mobility capacity in community setting.  Baseline: see objective. 06/25/24 21.38 sec with RW Goal status: MET  3.  Pt will improve ABC score by 30% in order to demonstrate improved pain with functional goals and outcomes. Baseline: see objective.  06/25/24 58.8% Goal status: IN PROGRESS  4.  Pt will improve single leg balance by at least 3 seconds bilaterally in order to demonstrate improved capacity, safety, and overall quality of  movement to optimize function.  Baseline: see objective. 06/25/24 2 on left and  1 on right Goal status: IN PROGRESS  ASSESSMENT:  CLINICAL IMPRESSION: Session focus with LE strengthening and balance training.  Added vector stance for hip stability to assist with SLS and squats for functional strengthening.  Pt tolerated well to session with no reports of pain through session outside of some soreness as reports she is doing more at home.  Reviewed current exercise program and added squats and tandem stance to be complete near counter with UE support.  Eval:  Patient is a 68y.o. female who was seen today for physical therapy evaluation and treatment for 'compression of spinal cord.'  Patient with history of myelopathy C5 spinal fracture, with A C4 through C7 cervical fusion.  Patient presents with significant functional mobility deficits, safety concerns, increased falls risk, reduced ambulation capacity at due to muscle weakness, decreased activity tolerance, balance deficits in setting of previous cervical myelopathy pt will benefit from skilled Physical Therapy services to address deficits/limitations in order to improve functional and QOL.    OBJECTIVE IMPAIRMENTS: Abnormal gait, decreased activity tolerance, decreased balance, decreased mobility, difficulty walking, decreased strength, and postural dysfunction.   ACTIVITY LIMITATIONS: carrying, lifting, bending, standing, squatting, stairs, transfers, bed mobility, and locomotion level  PARTICIPATION LIMITATIONS: meal prep, cleaning, laundry, community activity, occupation, and yard work  PERSONAL FACTORS: Age are also affecting patient's functional outcome.   REHAB POTENTIAL: Excellent  CLINICAL DECISION MAKING: Stable/uncomplicated  EVALUATION COMPLEXITY: Low  PLAN:  PT FREQUENCY: 2x/week  PT DURATION: 4 weeks  PLANNED INTERVENTIONS: 97164- PT Re-evaluation, 97750- Physical Performance Testing, 97110-Therapeutic exercises, 97530- Therapeutic activity, [id number]- Neuromuscular re-education, 97535- Self Care,  97140- Manual therapy, [phone]- Gait training, 02239- Orthotic Initial, Patient/Family education, Balance training, Stair training, Taping, Joint mobilization, Joint manipulation, Spinal manipulation, Spinal mobilization, Cryotherapy, and Moist heat  PLAN FOR NEXT SESSION: Functional mobility, strengthening, ambulation, etc. Extend PT 2 week 4 to address remaining unmet and partially met goals.    Augustin Mclean, LPTA/CLT; CBIS [phone]  4:11 PM, 06/30/24

## 2024-06-30 NOTE — Therapy (Signed)
 OUTPATIENT OCCUPATIONAL THERAPY NEURO TREATMENT NOTE  Patient Name: Angela Norman MRN: [id number] DOB:Jun 10, 1956, 68 y.o., female Today's Date: 06/30/2024     PCP: Shona Norleen PEDLAR, MD REFERRING PROVIDER: Hillman Amour, MD  END OF SESSION:  OT End of Session - 06/30/24 1502     Visit Number 12    Number of Visits 17    Date for OT Re-Evaluation 07/17/24    Authorization Type Medicare Part A and B    Progress Note Due on Visit 19    OT Start Time 1421    OT Stop Time 1501    OT Time Calculation (min) 40 min    Activity Tolerance Patient tolerated treatment well    Behavior During Therapy WFL for tasks assessed/performed             Past Medical History:  Diagnosis Date   Anxiety    Arthritis    Basal cell carcinoma    Charcot's joint of foot    bilateral   Diabetes mellitus without complication (HCC)    Fibromyalgia    Neuropathy    Past Surgical History:  Procedure Laterality Date   ACHILLES TENDON REPAIR Left    bone spur Left    --left foot   BREAST SURGERY     benign breast bx--left--fatty tissue   CARPAL TUNNEL RELEASE Bilateral    COLONOSCOPY     COLONOSCOPY WITH PROPOFOL  N/A 09/06/2022   Procedure: COLONOSCOPY WITH PROPOFOL ;  Surgeon: Shaaron Lamar HERO, MD;  Location: [facility];  Service: Endoscopy;  Laterality: N/A;  10:30am, asa 2   DILATION AND CURETTAGE OF UTERUS  2011   for cervical polyp   FOOT SURGERY Left 03/2018   reconstructive surgery   POLYPECTOMY  09/06/2022   Procedure: POLYPECTOMY;  Surgeon: Shaaron Lamar HERO, MD;  [facility]: [facility];  Service: Endoscopy;;   Patient Active Problem List   Diagnosis Date Noted   Anxiety 06/28/2022   Arthritis 06/28/2022   Body fluid retention 06/28/2022   Hyperglycemia due to type 2 diabetes mellitus (HCC) 06/28/2022   Mixed hyperlipidemia 06/28/2022   Fibromyalgia 06/28/2022   Cough 10/25/2021   Musculoskeletal pain 05/21/2012   Stiffness of joint, not elsewhere classified, ankle  and foot 08/13/2011   Difficulty walking 08/13/2011   Ankle weakness 08/13/2011    ONSET DATE: 08/2023  REFERRING DIAG:  G95.20 (ICD-10-CM) - Unspecified cord compression  R25.2 (ICD-10-CM) - Spastic   THERAPY DIAG:  Other lack of coordination  Other symptoms and signs involving the nervous system  Rationale for Evaluation and Treatment: Rehabilitation  SUBJECTIVE:   SUBJECTIVE STATEMENT: S: Anytime I do anything like lifting any weight it gets sore  PERTINENT HISTORY: Pt reported all this started with UTI that caused fracture f neck and indicated as C4-C7 fusion. Pt had multiple bouts of acute and inpatient rehab stays due to complications. Significant BP issues throughout this time. Had stay in multiple facilities. Pt reported various changes in functional status, was requiring assist for ADLs and mobility since April 28th. Since then, pt has been home with Home Health services. Pt continues to report numbness in BUE and poor grip strength. Pt reports neuropathy in BLE. Prior to onset of November '24 was walking daily for 45-60 minutes.   PRECAUTIONS: Fall  WEIGHT BEARING RESTRICTIONS: No  PAIN:  Are you having pain? Yes: NPRS scale: 3/10 Pain location: left upper arm Pain description: sore Aggravating factors: lifting Relieving factors: rest  FALLS: Has patient fallen in last  6 months? No  LIVING ENVIRONMENT: Lives with: lives with their spouse Lives in: House/apartment Stairs: Pt lives in the basement with no stairs Has following equipment at home: Vannie - 2 wheeled and shower chair  PLOF: Independent  PATIENT GOALS: To improve mobility and strengthening  OBJECTIVE:  Note: Objective measures were completed at Evaluation unless otherwise noted.  HAND DOMINANCE: Right  ADLs: Overall ADLs: Pt has difficulty with all small object manipulation, including but not limited to buttons, shoe laces, and zippers. Additionally she has max difficulty with cooking and  cleaning due to weakness in both hands and wrists.   MOBILITY STATUS: Hx of falls, difficulty with turns, and difficulty carrying objections with ambulation  POSTURE COMMENTS:  rounded shoulders Sitting balance: Moves/returns truncal midpoint 1-2 inches in multiple planes  ACTIVITY TOLERANCE: Activity tolerance: Fair tolerance - fatigues quicker than normal  FUNCTIONAL OUTCOME MEASURES: Quick Dash: 47.73  UPPER EXTREMITY ROM:    All ROM is WFL  UPPER EXTREMITY MMT:     MMT Right eval Left eval  Shoulder flexion 5/5 5/5  Shoulder abduction 5/5 5/5  Shoulder internal rotation 5/5 5/5  Shoulder external rotation 5/5 5/5  Elbow flexion 5/5 5/5  Elbow extension 5/5 5/5  Wrist flexion 4/5 4+/5  Wrist extension 4-/5 4/5  Wrist ulnar deviation 4-/5 4/5  Wrist radial deviation 4-/5 4-/5  Wrist pronation 4/5 4+/5  Wrist supination 4/5 4/5  (Blank rows = not tested)  HAND FUNCTION: Grip strength: Right: 20 lbs; Left: 27 lbs, Lateral pinch: Right: 5 lbs, Left: 9 lbs, and 3 point pinch: Right: 5 lbs, Left: 4 lbs  COORDINATION: 06/30/24-9 Hole Peg test: Right: 33.28 sec; Left: 38.26 sec  SENSATION: Pt has full numbness along median nerve of R hand as well as dulled sensation and tingling in BUE up to elbows.   EDEMA: No swelling noted  OBSERVATIONS: adducted thumbs and 80% grip                                                                                                                             TREATMENT DATE:  06/30/24 -Wrist strengthening: 2#-flexion, extension, ulnar/radial deviation, supination/Pronation, 12 reps -Sponges: left hand-23; right-19 -Hand gripper, BUE: Right-large and medium beads with gripper vertical and horizontal at 25#; Left-large and medium beads gripper horizontal at 25# -Pinch strengthening: pt using red clothespin and 3 point pinch to grasp and stack 4 towers of 5 sponges with left hand, removing and replacing in bucket with right hand and lateral  pinch; unable to maintain 3 point pinch with right hand 3rd digit  06/25/24 -Wrist ROM: flexion, extension, ulnar/radial deviation, supination/Pronation, x15 -Therabar: Teal, extension/flexion twists, ulnar/radial deviation, supination/pronation, x15 -Coins: picking up 10 coins at a time, placing one at a time in piggy bank -Pinch Tree: red, green, blue resistance clip, tripod up, lateral down - unable to do blue resistance clips with RUE  06/18/24 -Shoulder A/ROM: sitting-protraction, flexion, abduction, horizontal abduction, er, 15  reps -Proximal Shoulder Exercises: paddles, criss cross, circles both directions, 10 reps -Hand gripper, BUE: Right-large and medium beads with gripper vertical at 25#; Left-large and medium beads gripper horizontal at 25# -Wrist strengthening: 2#-extension, flexion, ulnar/radial deviation, 10 reps -Pinch strengthening: pt using red clothespin and 3 point pinch to grasp and stack 4 towers of 5 sponges with left hand, removing and replacing in bucket with right hand and lateral pinch; unable to maintain 3 point pinch with right hand 3rd digit -Theraputty: red-flatten, cutting circles with pvc pipe, rolling, gripping, pinching      PATIENT EDUCATION: Education details:  Person educated: Patient Education method: Programmer, multimedia, Facilities manager, and Handouts Education comprehension: verbalized understanding and returned demonstration  HOME EXERCISE PROGRAM: 8/1: Wrist ROM and Digit ROM 8/8: Shoulder A/ROM 8/20: Scapular Strengthening 9/4: Coin work   GOALS: Goals reviewed with patient? Yes  SHORT TERM GOALS: Target date: 06/19/24  Pt will be provided and educated on a comprehensive HEP for BUE mobility in order to complete ADL's and IADL's independently.   Goal status: IN PROGRESS  2.  Pt will improve BUE strength to 5/5 in order to lift and carry items during cooking and cleaning tasks.   Goal status: IN PROGRESS  3.  Pt will improve BUE grip by 15#  and pinch by 3# in order to grasp and hold pots and pans.  Goal status: IN PROGRESS  4.  Pt will improve BUE coordination by completing 9 hole peg test in 45 or less in order to manipulate and complete fine motor tasks.   Goal status: MET   ASSESSMENT:  CLINICAL IMPRESSION:  Pt reports she has been practicing with weights and completing coin tasks at home. Completed 9 hole peg test today with pt achieving her goal. Pt completing wrist strengthening and grip/pinch strengthening today, with focus on grip. Pt right hand fatiguing quickly today, rest breaks provided as needed throughout session. Verbal cuing for form and technique.    PERFORMANCE DEFICITS: in functional skills including ADLs, IADLs, coordination, dexterity, sensation, ROM, strength, pain, fascial restrictions, Fine motor control, Gross motor control, body mechanics, and UE functional use.    PLAN:  OT FREQUENCY: 2x/week  OT DURATION: 4 weeks  PLANNED INTERVENTIONS: 97168 OT Re-evaluation, 97535 self care/ADL training, 02889 therapeutic exercise, 97530 therapeutic activity, 97112 neuromuscular re-education, 97140 manual therapy, 97035 ultrasound, 97018 paraffin, 02989 moist heat, 97032 electrical stimulation (manual), passive range of motion, functional mobility training, energy conservation, coping strategies training, patient/family education, and DME and/or AE instructions  CONSULTED AND AGREED WITH PLAN OF CARE: Patient  PLAN FOR NEXT SESSION: Wrist and Digit ROM, strengthening, coordination tasks   Sonny Cory, OTR/L  [phone] 06/30/2024, 3:03 PM

## 2024-07-02 ENCOUNTER — Ambulatory Visit (HOSPITAL_COMMUNITY)

## 2024-07-02 ENCOUNTER — Encounter (HOSPITAL_COMMUNITY): Payer: Self-pay

## 2024-07-02 ENCOUNTER — Ambulatory Visit (HOSPITAL_COMMUNITY): Admitting: Occupational Therapy

## 2024-07-02 ENCOUNTER — Encounter (HOSPITAL_COMMUNITY): Payer: Self-pay | Admitting: Occupational Therapy

## 2024-07-02 DIAGNOSIS — R278 Other lack of coordination: Secondary | ICD-10-CM | POA: Diagnosis not present

## 2024-07-02 DIAGNOSIS — G9520 Unspecified cord compression: Secondary | ICD-10-CM

## 2024-07-02 DIAGNOSIS — R29818 Other symptoms and signs involving the nervous system: Secondary | ICD-10-CM

## 2024-07-02 DIAGNOSIS — Z7409 Other reduced mobility: Secondary | ICD-10-CM

## 2024-07-02 DIAGNOSIS — M6281 Muscle weakness (generalized): Secondary | ICD-10-CM

## 2024-07-02 NOTE — Therapy (Signed)
 OUTPATIENT OCCUPATIONAL THERAPY NEURO TREATMENT NOTE  Patient Name: ZULEYMA SCHARF MRN: [id number] DOB:1956-10-21, 68 y.o., female Today's Date: 07/02/2024     PCP: Shona Norleen PEDLAR, MD REFERRING PROVIDER: Hillman Amour, MD  END OF SESSION:  OT End of Session - 07/02/24 1510     Visit Number 13    Number of Visits 17    Date for OT Re-Evaluation 07/17/24    Authorization Type Medicare Part A and B    Progress Note Due on Visit 19    OT Start Time 1432    OT Stop Time 1510    OT Time Calculation (min) 38 min    Activity Tolerance Patient tolerated treatment well    Behavior During Therapy WFL for tasks assessed/performed              Past Medical History:  Diagnosis Date   Anxiety    Arthritis    Basal cell carcinoma    Charcot's joint of foot    bilateral   Diabetes mellitus without complication (HCC)    Fibromyalgia    Neuropathy    Past Surgical History:  Procedure Laterality Date   ACHILLES TENDON REPAIR Left    bone spur Left    --left foot   BREAST SURGERY     benign breast bx--left--fatty tissue   CARPAL TUNNEL RELEASE Bilateral    COLONOSCOPY     COLONOSCOPY WITH PROPOFOL  N/A 09/06/2022   Procedure: COLONOSCOPY WITH PROPOFOL ;  Surgeon: Shaaron Lamar HERO, MD;  Location: [facility];  Service: Endoscopy;  Laterality: N/A;  10:30am, asa 2   DILATION AND CURETTAGE OF UTERUS  2011   for cervical polyp   FOOT SURGERY Left 03/2018   reconstructive surgery   POLYPECTOMY  09/06/2022   Procedure: POLYPECTOMY;  Surgeon: Shaaron Lamar HERO, MD;  Location: [facility];  Service: Endoscopy;;   Patient Active Problem List   Diagnosis Date Noted   Anxiety 06/28/2022   Arthritis 06/28/2022   Body fluid retention 06/28/2022   Hyperglycemia due to type 2 diabetes mellitus (HCC) 06/28/2022   Mixed hyperlipidemia 06/28/2022   Fibromyalgia 06/28/2022   Cough 10/25/2021   Musculoskeletal pain 05/21/2012   Stiffness of joint, not elsewhere classified,  ankle and foot 08/13/2011   Difficulty walking 08/13/2011   Ankle weakness 08/13/2011    ONSET DATE: 08/2023  REFERRING DIAG:  G95.20 (ICD-10-CM) - Unspecified cord compression  R25.2 (ICD-10-CM) - Spastic   THERAPY DIAG:  Other lack of coordination  Other symptoms and signs involving the nervous system  Rationale for Evaluation and Treatment: Rehabilitation  SUBJECTIVE:   SUBJECTIVE STATEMENT: S: My arm is hurting a little bit  PERTINENT HISTORY: Pt reported all this started with UTI that caused fracture f neck and indicated as C4-C7 fusion. Pt had multiple bouts of acute and inpatient rehab stays due to complications. Significant BP issues throughout this time. Had stay in multiple facilities. Pt reported various changes in functional status, was requiring assist for ADLs and mobility since April 28th. Since then, pt has been home with Home Health services. Pt continues to report numbness in BUE and poor grip strength. Pt reports neuropathy in BLE. Prior to onset of November '24 was walking daily for 45-60 minutes.   PRECAUTIONS: Fall  WEIGHT BEARING RESTRICTIONS: No  PAIN:  Are you having pain? Yes: NPRS scale: 2/10 Pain location: left upper arm Pain description: sore Aggravating factors: lifting Relieving factors: rest  FALLS: Has patient fallen in last 6 months? No  LIVING ENVIRONMENT: Lives with: lives with their spouse Lives in: House/apartment Stairs: Pt lives in the basement with no stairs Has following equipment at home: Vannie - 2 wheeled and shower chair  PLOF: Independent  PATIENT GOALS: To improve mobility and strengthening  OBJECTIVE:  Note: Objective measures were completed at Evaluation unless otherwise noted.  HAND DOMINANCE: Right  ADLs: Overall ADLs: Pt has difficulty with all small object manipulation, including but not limited to buttons, shoe laces, and zippers. Additionally she has max difficulty with cooking and cleaning due to weakness  in both hands and wrists.   MOBILITY STATUS: Hx of falls, difficulty with turns, and difficulty carrying objections with ambulation  POSTURE COMMENTS:  rounded shoulders Sitting balance: Moves/returns truncal midpoint 1-2 inches in multiple planes  ACTIVITY TOLERANCE: Activity tolerance: Fair tolerance - fatigues quicker than normal  FUNCTIONAL OUTCOME MEASURES: Quick Dash: 47.73  UPPER EXTREMITY ROM:    All ROM is WFL  UPPER EXTREMITY MMT:     MMT Right eval Left eval  Shoulder flexion 5/5 5/5  Shoulder abduction 5/5 5/5  Shoulder internal rotation 5/5 5/5  Shoulder external rotation 5/5 5/5  Elbow flexion 5/5 5/5  Elbow extension 5/5 5/5  Wrist flexion 4/5 4+/5  Wrist extension 4-/5 4/5  Wrist ulnar deviation 4-/5 4/5  Wrist radial deviation 4-/5 4-/5  Wrist pronation 4/5 4+/5  Wrist supination 4/5 4/5  (Blank rows = not tested)  HAND FUNCTION: Grip strength: Right: 20 lbs; Left: 27 lbs, Lateral pinch: Right: 5 lbs, Left: 9 lbs, and 3 point pinch: Right: 5 lbs, Left: 4 lbs  COORDINATION: 06/30/24-9 Hole Peg test: Right: 33.28 sec; Left: 38.26 sec  SENSATION: Pt has full numbness along median nerve of R hand as well as dulled sensation and tingling in BUE up to elbows.   EDEMA: No swelling noted  OBSERVATIONS: adducted thumbs and 80% grip                                                                                                                             TREATMENT DATE:  07/02/24 -Wrist strengthening: red band-flexion, extension, ulnar/radial deviation, supination/Pronation, 10 reps -Hand gripper, BUE: Right-large and medium beads with gripper vertical at 25#; Left-large and medium beads gripper vertical at 25# -Theraputty: red-flatten, cutting circles with pvc pipe working on grip and wrist stability, pvc pipe pull  06/30/24 -Wrist strengthening: 2#-flexion, extension, ulnar/radial deviation, supination/Pronation, 12 reps -Sponges: left hand-23;  right-19 -Hand gripper, BUE: Right-large and medium beads with gripper vertical and horizontal at 25#; Left-large and medium beads gripper horizontal at 25# -Pinch strengthening: pt using red clothespin and 3 point pinch to grasp and stack 4 towers of 5 sponges with left hand, removing and replacing in bucket with right hand and lateral pinch; unable to maintain 3 point pinch with right hand 3rd digit  06/25/24 -Wrist ROM: flexion, extension, ulnar/radial deviation, supination/Pronation, x15 -Therabar: Teal, extension/flexion twists, ulnar/radial deviation, supination/pronation, x15 -Coins: picking up  10 coins at a time, placing one at a time in piggy bank -Pinch Tree: red, green, blue resistance clip, tripod up, lateral down - unable to do blue resistance clips with RUE      PATIENT EDUCATION: Education details: reviewed HEP Person educated: Patient Education method: Explanation, Demonstration, and Handouts Education comprehension: verbalized understanding and returned demonstration  HOME EXERCISE PROGRAM: 8/1: Wrist ROM and Digit ROM 8/8: Shoulder A/ROM 8/20: Scapular Strengthening 9/4: Coin work   GOALS: Goals reviewed with patient? Yes  SHORT TERM GOALS: Target date: 06/19/24  Pt will be provided and educated on a comprehensive HEP for BUE mobility in order to complete ADL's and IADL's independently.   Goal status: IN PROGRESS  2.  Pt will improve BUE strength to 5/5 in order to lift and carry items during cooking and cleaning tasks.   Goal status: IN PROGRESS  3.  Pt will improve BUE grip by 15# and pinch by 3# in order to grasp and hold pots and pans.  Goal status: IN PROGRESS  4.  Pt will improve BUE coordination by completing 9 hole peg test in 45 or less in order to manipulate and complete fine motor tasks.   Goal status: MET   ASSESSMENT:  CLINICAL IMPRESSION:  Continued with wrist strengthening and stability work transitioning to red theraband vs hand  weights today. Pt with improved tolerance in grip strengthening, completing with hand gripper in more challenging vertical position vs horizontal. Fatigued noted in right hand on last 2 medium beads, rest breaks encouraged to improve success. Added wrist and grip stability work with pvc pipe pull, mod fatigue but good form. Verbal cuing for form and technique during tasks. Rest breaks provided as needed.    PERFORMANCE DEFICITS: in functional skills including ADLs, IADLs, coordination, dexterity, sensation, ROM, strength, pain, fascial restrictions, Fine motor control, Gross motor control, body mechanics, and UE functional use.    PLAN:  OT FREQUENCY: 2x/week  OT DURATION: 4 weeks  PLANNED INTERVENTIONS: 97168 OT Re-evaluation, 97535 self care/ADL training, 02889 therapeutic exercise, 97530 therapeutic activity, 97112 neuromuscular re-education, 97140 manual therapy, 97035 ultrasound, 97018 paraffin, 02989 moist heat, 97032 electrical stimulation (manual), passive range of motion, functional mobility training, energy conservation, coping strategies training, patient/family education, and DME and/or AE instructions  CONSULTED AND AGREED WITH PLAN OF CARE: Patient  PLAN FOR NEXT SESSION: Wrist and Digit ROM, strengthening, coordination tasks   Sonny Cory, OTR/L  [phone] 07/02/2024, 3:11 PM

## 2024-07-02 NOTE — Therapy (Signed)
 OUTPATIENT PHYSICAL THERAPY NEURO TREATMENT   Patient Name: TISHANNA DUNFORD MRN: [id number] DOB:[birth year], 68 y.o., female Today's Date: 07/02/2024   PCP: Sebastian Othel GAILS, FNP REFERRING PROVIDER: Edna Norris, MD  END OF SESSION:  PT End of Session - 07/02/24 1517     Visit Number 13    Number of Visits 24    Date for PT Re-Evaluation 07/28/24    Authorization Type Medicare A & B    PT Start Time 1517    PT Stop Time 1555    PT Time Calculation (min) 38 min    Equipment Utilized During Treatment Gait belt    Activity Tolerance Patient tolerated treatment well    Behavior During Therapy WFL for tasks assessed/performed               Past Medical History:  Diagnosis Date   Anxiety    Arthritis    Basal cell carcinoma    Charcot's joint of foot    bilateral   Diabetes mellitus without complication (HCC)    Fibromyalgia    Neuropathy    Past Surgical History:  Procedure Laterality Date   ACHILLES TENDON REPAIR Left    bone spur Left    --left foot   BREAST SURGERY     benign breast bx--left--fatty tissue   CARPAL TUNNEL RELEASE Bilateral    COLONOSCOPY     COLONOSCOPY WITH PROPOFOL  N/A 09/06/2022   Procedure: COLONOSCOPY WITH PROPOFOL ;  Surgeon: Shaaron Lamar HERO, MD;  Location: [facility];  Service: Endoscopy;  Laterality: N/A;  10:30am, asa 2   DILATION AND CURETTAGE OF UTERUS  2011   for cervical polyp   FOOT SURGERY Left 03/2018   reconstructive surgery   POLYPECTOMY  09/06/2022   Procedure: POLYPECTOMY;  Surgeon: Shaaron Lamar HERO, MD;  [facility]: [facility];  Service: Endoscopy;;   Patient Active Problem List   Diagnosis Date Noted   Anxiety 06/28/2022   Arthritis 06/28/2022   Body fluid retention 06/28/2022   Hyperglycemia due to type 2 diabetes mellitus (HCC) 06/28/2022   Mixed hyperlipidemia 06/28/2022   Fibromyalgia 06/28/2022   Cough 10/25/2021   Musculoskeletal pain 05/21/2012   Stiffness of joint, not elsewhere  classified, ankle and foot 08/13/2011   Difficulty walking 08/13/2011   Ankle weakness 08/13/2011    ONSET DATE: November 2024  REFERRING DIAG: compression of spinal cord   THERAPY DIAG:  Compression of spinal cord (HCC)  Impaired functional mobility, balance, gait, and endurance  Muscle weakness (generalized)  Rationale for Evaluation and Treatment: Rehabilitation  SUBJECTIVE:  SUBJECTIVE STATEMENT: Pt states she has been walking 17 laps in her house with walker. Pt states she still uses the wheelchair for prolonged house work. Pt reports no falls since last session, little sore on inside of the legs.   Eval:  Pt reported all this started with UTI that caused fracture of neck and indicated as C4-C7 fusion. Pt had multiple bouts of acute and inpatient rehab stays due to complications. Significant BP issues throughout this time. Had stay in multiple facilities. Pt reported various changes in functional status, was requiring assist for ADLs and mobility since April 28th. Since then, pt has been home with Home Health services. Pt continues to report numbness in BUE and poor grip strength. Pt reports neuropathy in BLE. Prior to onset of November '24 was walking daily for 45-60 minutes.   Pt has CT scan for nodule on Lung on 04/28/24.  Pt accompanied by: self  PERTINENT HISTORY:  Cervical Myelopathy with ACDF 4-7 in November 2024\ Left ankle surgery x 3  PAIN:  Are you having pain? No and chronic pain in left shoulder  PRECAUTIONS: None  RED FLAGS: Bowel or bladder incontinence: Yes: SCI   WEIGHT BEARING RESTRICTIONS: No  FALLS: Has patient fallen in last 6 months? Yes. Number of falls 1-2   PATIENT GOALS:  want to walk with AD well and with confidence and strengthening RUE.  OBJECTIVE:   Note: Objective measures were completed at Evaluation unless otherwise noted.  DIAGNOSTIC FINDINGS:   COGNITION: Overall cognitive status: Within functional limits for tasks assessed   SENSATION: Light touch: decreased light touch from lateral malleolus distally.   LOWER EXTREMITY ROM:     Active  Right Eval Left Eval  Hip flexion    Hip extension    Hip abduction    Hip adduction    Hip internal rotation    Hip external rotation    Knee flexion    Knee extension    Ankle dorsiflexion    Ankle plantarflexion    Ankle inversion    Ankle eversion     (Blank rows = not tested)  LOWER EXTREMITY MMT:    MMT Right Eval Left Eval Right 06/25/24 Left 06/25/24  Hip flexion      Hip extension      Hip abduction      Hip adduction      Hip internal rotation      Hip external rotation      Knee flexion      Knee extension 4- 4+ 4+ 5  Ankle dorsiflexion      Ankle plantarflexion      Ankle inversion      Ankle eversion      (Blank rows = not tested)   TRANSFERS: Sit to stand: Modified independence  Assistive device utilized: Environmental consultant - 2 wheeled     Stand to sit: Modified independence  Assistive device utilized: Environmental consultant - 2 wheeled     Chair to chair: SBA  Assistive device utilized: Environmental consultant - 2 wheeled       GAIT: Findings: Gait Characteristics: step through pattern, decreased arm swing- Right, decreased arm swing- Left, decreased step length- Right, decreased step length- Left, decreased hip/knee flexion- Right, decreased hip/knee flexion- Left, Right foot flat, Left foot flat, and narrow BOS, Distance walked: 25ft, Assistive device utilized:Walker - 2 wheeled, Level of assistance: Modified independence, and Comments:    FUNCTIONAL TESTS:  TUG: 25.71 seconds  DGI: 05/13/24: 8 / 24  L SLS: <0.1  seconds  R SLS: <0.1 seconds  Norms: 18-39  F: 43.5 seconds  M: 43.2 seconds 40-49  F: 40.4 seconds  M: 40.1 seconds 50-59  F: 36 seconds  M: 38.1 seconds 60-69  F:  25.1 seconds  M: 28.7 seconds 70-79  F: 11.3 seconds  M: 18.3 seconds  PATIENT SURVEYS:  ABC Scale:950 / 1600 = 59.4 %                                                                                                                              TREATMENT DATE:  07/02/2024  Therapeutic Exercise: -Nustep seat 8 x 5' level 6, pt avgs 70 SPM -Leg press 3 sets of 10 reps plate 3>4, pt cued for LE alignment -Heel raises, 2 sets of 10 reps, pt cued for UE support for increased ROM  Therapeutic Activity: -Sit to stands from standard chair - 6 reps pt cued for core activation -Sled push, 20lbs, 2 laps, 50 foot laps, pt cued for consistent movement of sled and educated on functionality -Speed step ups, 2 bouts of 30 second trials (8.5 first set, 9.5 second set), pt cued for sequencing and increased speed -Step up and overs, with only 1 UE support, 6 inch step, 1 set of 3 reps   06/30/24:   Nustep [place] UE/LE x 5 min L3 resistance Standing inside // bars:  6 box toe taps with 1 UE assist 2 x 10 each 4# each ankle  Lateral toe tapping between 6 and 8in step height with 4# each ankle  Vector stance with BUE assist 3x 5  Sidestep inside // bars with 4# on ankle 5RT  Squat front of chair 10x   Tandem stance 1x 30, 2x 30 on foam with intermittent HHA   06/25/24 Progress note ABC 58.8% MMT's knee extension left 5; right 4+ TUG 21.38 sec with RW SLS left 2 and right 1  DGI 1. Gait level surface (2) Mild Impairment: Walks 20', uses assistive devices, slower speed, mild gait deviations. 2. Change in gait speed (2) Mild Impairment: Is able to change speed but demonstrates mild gait deviations, or not gait deviations but unable to achieve a significant change in velocity, or uses an assistive device. 3. Gait with horizontal head turns (2) Mild Impairment: Performs head turns smoothly with slight change in gait velocity, i.e., minor disruption to smooth gait path or uses walking aid. 4.  Gait with vertical head turns (2) Mild Impairment: Performs head turns smoothly with slight change in gait velocity, i.e., minor disruption to smooth gait path or uses walking aid. 5. Gait and pivot turn (2) Mild Impairment: Pivot turns safely in > 3 seconds and stops with no loss of balance. 6. Step over obstacle (1) Moderate Impairment: Is able to step over box but must stop, then step over. May require verbal cueing. 7. Step around obstacles (2) Mild Impairment: Is able to step around both cones, but must slow down  and adjust steps to clear cones. 8. Stairs (1) Moderate Impairment: Two feet to a stair, must use rail.  TOTAL SCORE: 14 / 24   PATIENT EDUCATION: Education details: PT Evaluation, findings, prognosis, frequency, attendance policy. Person educated: Patient Education method: Explanation and Demonstration Education comprehension: verbalized understanding  HOME EXERCISE PROGRAM: Access Code: [email or web address] URL: [email or web address] Date: 05/14/2024 Prepared by: Augustin Mclean  Exercises - Sit to Stand  - 1 x daily - 7 x weekly - 3 sets - 10 reps - Standing March with Counter Support  - 1 x daily - 7 x weekly - 3 sets - 10 reps - Standing Hip Abduction with Counter Support  - 1 x daily - 7 x weekly - 3 sets - 10 reps - Side Stepping with Resistance at Thighs and Counter Support  - 1 x daily - 7 x weekly - 3 sets - 10 reps  06/30/24: - Squat with Chair and Counter Support  - 2 x daily - 7 x weekly - 1 sets - 10 reps - Standing Tandem Balance with Counter Support  - 1 x daily - 7 x weekly - 1 sets - 3 reps - 30 hold  GOALS: Goals reviewed with patient? No  SHORT TERM GOALS: Target date: 05/25/2024  Pt will be independent with HEP in order to demonstrate participation in Physical Therapy POC.  Baseline: Goal status: MET  2.  Pt will improve ABC score by 15% in order to demonstrate improved pain with functional goals and outcomes. Baseline:  Goal status: IN  PROGRESS  LONG TERM GOALS: Target date: 06/22/2024  Pt will increase DGI score by > MCID in order to demonstrate improved functional safety and balance skills in ADL/mobility.   Baseline: see objective. 06/25/24  14/24 Goal status: IN PROGRESS  2.  Pt will improve TUG by least 3 seconds in order to demonstrate improved functional mobility capacity in community setting.  Baseline: see objective. 06/25/24 21.38 sec with RW Goal status: MET  3.  Pt will improve ABC score by 30% in order to demonstrate improved pain with functional goals and outcomes. Baseline: see objective.  06/25/24 58.8% Goal status: IN PROGRESS  4.  Pt will improve single leg balance by at least 3 seconds bilaterally in order to demonstrate improved capacity, safety, and overall quality of movement to optimize function.  Baseline: see objective. 06/25/24 2 on left and 1 on right Goal status: IN PROGRESS  ASSESSMENT:  CLINICAL IMPRESSION: Patient continues to demonstrate decreased LE strength, decreased gait quality and balance. Patient also demonstrates increased endurance with aerobic based exercise during today's session on NuStep with ability to maintain over 70 SPM. Patient able to progress dynamic balance and core activation exercises today with step up variations for home carryover, good performance with verbal cueing. Patient would continue to benefit from skilled physical therapy for increased endurance with ambulation, increased LE strength, and improved balance for improved quality of life, improved independence with gait training and continued progress towards therapy goals.   Eval:  Patient is a 68y.o. female who was seen today for physical therapy evaluation and treatment for 'compression of spinal cord.'  Patient with history of myelopathy C5 spinal fracture, with A C4 through C7 cervical fusion.  Patient presents with significant functional mobility deficits, safety concerns, increased falls risk, reduced ambulation  capacity at due to muscle weakness, decreased activity tolerance, balance deficits in setting of previous cervical myelopathy pt will benefit from skilled Physical Therapy services to address deficits/limitations in order  to improve functional and QOL.    OBJECTIVE IMPAIRMENTS: Abnormal gait, decreased activity tolerance, decreased balance, decreased mobility, difficulty walking, decreased strength, and postural dysfunction.   ACTIVITY LIMITATIONS: carrying, lifting, bending, standing, squatting, stairs, transfers, bed mobility, and locomotion level  PARTICIPATION LIMITATIONS: meal prep, cleaning, laundry, community activity, occupation, and yard work  PERSONAL FACTORS: Age are also affecting patient's functional outcome.   REHAB POTENTIAL: Excellent  CLINICAL DECISION MAKING: Stable/uncomplicated  EVALUATION COMPLEXITY: Low  PLAN:  PT FREQUENCY: 2x/week  PT DURATION: 4 weeks  PLANNED INTERVENTIONS: 97164- PT Re-evaluation, 97750- Physical Performance Testing, 97110-Therapeutic exercises, 97530- Therapeutic activity, [id number]- Neuromuscular re-education, 97535- Self Care, 02859- Manual therapy, [phone]- Gait training, [phone]- Orthotic Initial, Patient/Family education, Balance training, Stair training, Taping, Joint mobilization, Joint manipulation, Spinal manipulation, Spinal mobilization, Cryotherapy, and Moist heat  PLAN FOR NEXT SESSION: Functional mobility, strengthening, ambulation, etc. Extend PT 2 week 4 to address remaining unmet and partially met goals.    Ethelyne Erich, PT, DPT [facility] Office: [phone] 4:00 PM, 07/02/24

## 2024-07-07 ENCOUNTER — Encounter (HOSPITAL_COMMUNITY): Payer: Self-pay

## 2024-07-07 ENCOUNTER — Ambulatory Visit (HOSPITAL_COMMUNITY)

## 2024-07-07 DIAGNOSIS — M6281 Muscle weakness (generalized): Secondary | ICD-10-CM

## 2024-07-07 DIAGNOSIS — R278 Other lack of coordination: Secondary | ICD-10-CM | POA: Diagnosis not present

## 2024-07-07 DIAGNOSIS — G9520 Unspecified cord compression: Secondary | ICD-10-CM

## 2024-07-07 DIAGNOSIS — Z7409 Other reduced mobility: Secondary | ICD-10-CM

## 2024-07-07 NOTE — Therapy (Signed)
 OUTPATIENT PHYSICAL THERAPY NEURO TREATMENT   Patient Name: Angela Norman MRN: [id number] DOB:1955-10-27, 68 y.o., female Today's Date: 07/07/2024   PCP: Sebastian Othel GAILS, FNP REFERRING PROVIDER: Edna Norris, MD  END OF SESSION:  PT End of Session - 07/07/24 1512     Visit Number 14    Number of Visits 24    Date for PT Re-Evaluation 07/28/24    Authorization Type Medicare A & B    PT Start Time 1518    PT Stop Time 1559    PT Time Calculation (min) 41 min    Equipment Utilized During Treatment Gait belt    Activity Tolerance Patient tolerated treatment well    Behavior During Therapy WFL for tasks assessed/performed               Past Medical History:  Diagnosis Date   Anxiety    Arthritis    Basal cell carcinoma    Charcot's joint of foot    bilateral   Diabetes mellitus without complication (HCC)    Fibromyalgia    Neuropathy    Past Surgical History:  Procedure Laterality Date   ACHILLES TENDON REPAIR Left    bone spur Left    --left foot   BREAST SURGERY     benign breast bx--left--fatty tissue   CARPAL TUNNEL RELEASE Bilateral    COLONOSCOPY     COLONOSCOPY WITH PROPOFOL  N/A 09/06/2022   Procedure: COLONOSCOPY WITH PROPOFOL ;  Surgeon: Shaaron Lamar HERO, MD;  Location: [facility];  Service: Endoscopy;  Laterality: N/A;  10:30am, asa 2   DILATION AND CURETTAGE OF UTERUS  2011   for cervical polyp   FOOT SURGERY Left 03/2018   reconstructive surgery   POLYPECTOMY  09/06/2022   Procedure: POLYPECTOMY;  Surgeon: Shaaron Lamar HERO, MD;  Location: [facility];  Service: Endoscopy;;   Patient Active Problem List   Diagnosis Date Noted   Anxiety 06/28/2022   Arthritis 06/28/2022   Body fluid retention 06/28/2022   Hyperglycemia due to type 2 diabetes mellitus (HCC) 06/28/2022   Mixed hyperlipidemia 06/28/2022   Fibromyalgia 06/28/2022   Cough 10/25/2021   Musculoskeletal pain 05/21/2012   Stiffness of joint, not elsewhere  classified, ankle and foot 08/13/2011   Difficulty walking 08/13/2011   Ankle weakness 08/13/2011    ONSET DATE: November 2024  REFERRING DIAG: compression of spinal cord   THERAPY DIAG:  Compression of spinal cord (HCC)  Impaired functional mobility, balance, gait, and endurance  Muscle weakness (generalized)  Rationale for Evaluation and Treatment: Rehabilitation  SUBJECTIVE:  SUBJECTIVE STATEMENT: Feeling good today, no reoprts of pain today or recent falls.  Has been walking around the house for ~15, able to complete 17 laps in her house with walker.  Reports she is having railing installed outside stairs at home  Eval:  Pt reported all this started with UTI that caused fracture of neck and indicated as C4-C7 fusion. Pt had multiple bouts of acute and inpatient rehab stays due to complications. Significant BP issues throughout this time. Had stay in multiple facilities. Pt reported various changes in functional status, was requiring assist for ADLs and mobility since April 28th. Since then, pt has been home with Home Health services. Pt continues to report numbness in BUE and poor grip strength. Pt reports neuropathy in BLE. Prior to onset of November '24 was walking daily for 45-60 minutes.   Pt has CT scan for nodule on Lung on 04/28/24.  Pt accompanied by: self  PERTINENT HISTORY:  Cervical Myelopathy with ACDF 4-7 in November 2024\ Left ankle surgery x 3  PAIN:  Are you having pain? No and chronic pain in left shoulder  PRECAUTIONS: None  RED FLAGS: Bowel or bladder incontinence: Yes: SCI   WEIGHT BEARING RESTRICTIONS: No  FALLS: Has patient fallen in last 6 months? Yes. Number of falls 1-2   PATIENT GOALS:  want to walk with AD well and with confidence and strengthening  RUE.  OBJECTIVE:  Note: Objective measures were completed at Evaluation unless otherwise noted.  DIAGNOSTIC FINDINGS:   COGNITION: Overall cognitive status: Within functional limits for tasks assessed   SENSATION: Light touch: decreased light touch from lateral malleolus distally.   LOWER EXTREMITY ROM:     Active  Right Eval Left Eval  Hip flexion    Hip extension    Hip abduction    Hip adduction    Hip internal rotation    Hip external rotation    Knee flexion    Knee extension    Ankle dorsiflexion    Ankle plantarflexion    Ankle inversion    Ankle eversion     (Blank rows = not tested)  LOWER EXTREMITY MMT:    MMT Right Eval Left Eval Right 06/25/24 Left 06/25/24  Hip flexion      Hip extension      Hip abduction      Hip adduction      Hip internal rotation      Hip external rotation      Knee flexion      Knee extension 4- 4+ 4+ 5  Ankle dorsiflexion      Ankle plantarflexion      Ankle inversion      Ankle eversion      (Blank rows = not tested)   TRANSFERS: Sit to stand: Modified independence  Assistive device utilized: Environmental consultant - 2 wheeled     Stand to sit: Modified independence  Assistive device utilized: Environmental consultant - 2 wheeled     Chair to chair: SBA  Assistive device utilized: Environmental consultant - 2 wheeled       GAIT: Findings: Gait Characteristics: step through pattern, decreased arm swing- Right, decreased arm swing- Left, decreased step length- Right, decreased step length- Left, decreased hip/knee flexion- Right, decreased hip/knee flexion- Left, Right foot flat, Left foot flat, and narrow BOS, Distance walked: 15ft, Assistive device utilized:Walker - 2 wheeled, Level of assistance: Modified independence, and Comments:    FUNCTIONAL TESTS:  TUG: 25.71 seconds  DGI: 05/13/24: 8 / 24  L  SLS: <0.1 seconds  R SLS: <0.1 seconds  Norms: 18-39  F: 43.5 seconds  M: 43.2 seconds 40-49  F: 40.4 seconds  M: 40.1 seconds 50-59  F: 36 seconds  M: 38.1  seconds 60-69  F: 25.1 seconds  M: 28.7 seconds 70-79  F: 11.3 seconds  M: 18.3 seconds  PATIENT SURVEYS:  ABC Scale:950 / 1600 = 59.4 %                                                                                                                              TREATMENT DATE:  07/07/24:   - Nustep UE/LE x 5' L6 [place] average SPM 70 - 12in hurdles forward and sidestep over 2RT with BUE support (attempted 1 but increased instability) - Step up and overs, with only 1 UE support, 6 inch step, 1 set of 3 reps - 5 Sit to stand no UE support 14.87 - Squats front of chair 10x3 - Heel raise with UE support for improved ROM 2x 10 on incline slope - Tandem stance with one foot on 6in step/other on floor intermittent HHA required 2x 30    07/02/2024  Therapeutic Exercise: -Nustep seat 8 x 5' level 6, pt avgs 70 SPM -Leg press 3 sets of 10 reps plate 3>4, pt cued for LE alignment -Heel raises, 2 sets of 10 reps, pt cued for UE support for increased ROM  Therapeutic Activity: -Sit to stands from standard chair - 6 reps pt cued for core activation -Sled push, 20lbs, 2 laps, 50 foot laps, pt cued for consistent movement of sled and educated on functionality -Speed step ups, 2 bouts of 30 second trials (8.5 first set, 9.5 second set), pt cued for sequencing and increased speed -Step up and overs, with only 1 UE support, 6 inch step, 1 set of 3 reps    06/25/24 Progress note ABC 58.8% MMT's knee extension left 5; right 4+ TUG 21.38 sec with RW SLS left 2 and right 1  DGI 1. Gait level surface (2) Mild Impairment: Walks 20', uses assistive devices, slower speed, mild gait deviations. 2. Change in gait speed (2) Mild Impairment: Is able to change speed but demonstrates mild gait deviations, or not gait deviations but unable to achieve a significant change in velocity, or uses an assistive device. 3. Gait with horizontal head turns (2) Mild Impairment: Performs head turns  smoothly with slight change in gait velocity, i.e., minor disruption to smooth gait path or uses walking aid. 4. Gait with vertical head turns (2) Mild Impairment: Performs head turns smoothly with slight change in gait velocity, i.e., minor disruption to smooth gait path or uses walking aid. 5. Gait and pivot turn (2) Mild Impairment: Pivot turns safely in > 3 seconds and stops with no loss of balance. 6. Step over obstacle (1) Moderate Impairment: Is able to step over box but must stop, then step over. May require verbal cueing. 7. Step around  obstacles (2) Mild Impairment: Is able to step around both cones, but must slow down and adjust steps to clear cones. 8. Stairs (1) Moderate Impairment: Two feet to a stair, must use rail.  TOTAL SCORE: 14 / 24   PATIENT EDUCATION: Education details: PT Evaluation, findings, prognosis, frequency, attendance policy. Person educated: Patient Education method: Explanation and Demonstration Education comprehension: verbalized understanding  HOME EXERCISE PROGRAM: Access Code: [email or web address] URL: [email or web address] Date: 05/14/2024 Prepared by: Augustin Mclean  Exercises - Sit to Stand  - 1 x daily - 7 x weekly - 3 sets - 10 reps - Standing March with Counter Support  - 1 x daily - 7 x weekly - 3 sets - 10 reps - Standing Hip Abduction with Counter Support  - 1 x daily - 7 x weekly - 3 sets - 10 reps - Side Stepping with Resistance at Thighs and Counter Support  - 1 x daily - 7 x weekly - 3 sets - 10 reps  06/30/24: - Squat with Chair and Counter Support  - 2 x daily - 7 x weekly - 1 sets - 10 reps - Standing Tandem Balance with Counter Support  - 1 x daily - 7 x weekly - 1 sets - 3 reps - 30 hold  GOALS: Goals reviewed with patient? No  SHORT TERM GOALS: Target date: 05/25/2024  Pt will be independent with HEP in order to demonstrate participation in Physical Therapy POC.  Baseline: Goal status: MET  2.  Pt will improve ABC  score by 15% in order to demonstrate improved pain with functional goals and outcomes. Baseline:  Goal status: IN PROGRESS  LONG TERM GOALS: Target date: 06/22/2024  Pt will increase DGI score by > MCID in order to demonstrate improved functional safety and balance skills in ADL/mobility.   Baseline: see objective. 06/25/24  14/24 Goal status: IN PROGRESS  2.  Pt will improve TUG by least 3 seconds in order to demonstrate improved functional mobility capacity in community setting.  Baseline: see objective. 06/25/24 21.38 sec with RW Goal status: MET  3.  Pt will improve ABC score by 30% in order to demonstrate improved pain with functional goals and outcomes. Baseline: see objective.  06/25/24 58.8% Goal status: IN PROGRESS  4.  Pt will improve single leg balance by at least 3 seconds bilaterally in order to demonstrate improved capacity, safety, and overall quality of movement to optimize function.  Baseline: see objective. 06/25/24 2 on left and 1 on right Goal status: IN PROGRESS  ASSESSMENT:  CLINICAL IMPRESSION: Session focus with LE strengthening, activity tolerance and balance.  Continued functional strengthening exercises with reports of some ease with stairs at home.  Presents with improved stability with BUE support during step ups. Presents with increased difficulty and heavy UE support when reduce to 1 UE.  Static balance continues to be difficult for pt, required min A for safety without UE support.  No reports of pain and appropriate fatigue levels.  Did report Rt knee buckling during step up training on 3rd set due to musculature fatigue.  Pt able to complete 5 STS without HHA in 14.87.    Eval:  Patient is a 68y.o. female who was seen today for physical therapy evaluation and treatment for 'compression of spinal cord.'  Patient with history of myelopathy C5 spinal fracture, with A C4 through C7 cervical fusion.  Patient presents with significant functional mobility deficits,  safety concerns, increased falls risk, reduced ambulation capacity at due to muscle  weakness, decreased activity tolerance, balance deficits in setting of previous cervical myelopathy pt will benefit from skilled Physical Therapy services to address deficits/limitations in order to improve functional and QOL.    OBJECTIVE IMPAIRMENTS: Abnormal gait, decreased activity tolerance, decreased balance, decreased mobility, difficulty walking, decreased strength, and postural dysfunction.   ACTIVITY LIMITATIONS: carrying, lifting, bending, standing, squatting, stairs, transfers, bed mobility, and locomotion level  PARTICIPATION LIMITATIONS: meal prep, cleaning, laundry, community activity, occupation, and yard work  PERSONAL FACTORS: Age are also affecting patient's functional outcome.   REHAB POTENTIAL: Excellent  CLINICAL DECISION MAKING: Stable/uncomplicated  EVALUATION COMPLEXITY: Low  PLAN:  PT FREQUENCY: 2x/week  PT DURATION: 4 weeks  PLANNED INTERVENTIONS: 97164- PT Re-evaluation, 97750- Physical Performance Testing, 97110-Therapeutic exercises, 97530- Therapeutic activity, 97112- Neuromuscular re-education, 97535- Self Care, [id number]- Manual therapy, [phone]- Gait training, [phone]- Orthotic Initial, Patient/Family education, Balance training, Stair training, Taping, Joint mobilization, Joint manipulation, Spinal manipulation, Spinal mobilization, Cryotherapy, and Moist heat  PLAN FOR NEXT SESSION: Functional mobility, strengthening, ambulation, etc.   Augustin Mclean, LPTA/CLT; [facility] [phone]  4:11 PM, 07/07/24

## 2024-07-09 ENCOUNTER — Ambulatory Visit (HOSPITAL_COMMUNITY)

## 2024-07-09 ENCOUNTER — Encounter (HOSPITAL_COMMUNITY): Payer: Self-pay | Admitting: Occupational Therapy

## 2024-07-09 ENCOUNTER — Ambulatory Visit (HOSPITAL_COMMUNITY): Admitting: Occupational Therapy

## 2024-07-09 ENCOUNTER — Encounter (HOSPITAL_COMMUNITY): Payer: Self-pay

## 2024-07-09 DIAGNOSIS — Z7409 Other reduced mobility: Secondary | ICD-10-CM

## 2024-07-09 DIAGNOSIS — G9520 Unspecified cord compression: Secondary | ICD-10-CM

## 2024-07-09 DIAGNOSIS — R278 Other lack of coordination: Secondary | ICD-10-CM | POA: Diagnosis not present

## 2024-07-09 DIAGNOSIS — R29818 Other symptoms and signs involving the nervous system: Secondary | ICD-10-CM

## 2024-07-09 DIAGNOSIS — M6281 Muscle weakness (generalized): Secondary | ICD-10-CM

## 2024-07-09 NOTE — Patient Instructions (Addendum)
 Complete 10 reps each, 2-3x/day   1) Wrist Flexion  -Begin sitting upright with your forearm resting on a table, your hand hanging off the edge, and palm facing up, holding the ends of a resistance band in each hand. -Lift your hand up, bending your wrist forward and pulling against the resistance. Hold briefly, then relax and repeat. -Make sure to keep the rest of your arm relaxed during the exercise.   To    2) Wrist Extension  -Begin sitting upright with your forearm resting on a table, your hand hanging off the edge, and palm facing down, holding the ends of a resistance band in each hand. -Lift your hand up, bending your wrist backward and pulling against the resistance. Hold briefly, then relax and repeat. -Make sure to keep the rest of your arm relaxed during the exercise.   To    3) Radial Deviation  -Begin sitting upright in a chair with your hand hanging off the edge of a table, palm facing inward, holding one end of a resistance band anchored around your feet. [facility] your wrist upward against the resistance, then lower it back down and repeat. -Make sure to keep your back straight and only bend at your wrist during the exercise.    4) Wrist Supination  -Begin sitting with your forearm resting on a table, holding one end of a resistance band that is anchored under your foot. Your palm should be facing down with the band running between your thumb and index finger. -Slowly rotate your wrist so your palm faces upward, then rotate it back to the starting position and repeat. -Make sure not to bend your wrist as you rotate your arm, and keep your shoulder relaxed.   To    5) Wrist Pronation  -Begin sitting with your forearm resting on your thigh, holding one end of a resistance band that is anchored under your foot. Your palm should be up, with the band running between your thumb and index finger. -Slowly rotate your wrist so your palm faces downward, then rotate it back to  the starting position and repeat. -Make sure not to bend your wrist as you rotate your arm, and keep your shoulder relaxed.  To

## 2024-07-09 NOTE — Therapy (Signed)
 OUTPATIENT OCCUPATIONAL THERAPY NEURO TREATMENT NOTE  Patient Name: Angela Norman MRN: [id number] DOB:Sep 09, 1956, 68 y.o., female Today's Date: 07/09/2024     PCP: Shona Norleen PEDLAR, MD REFERRING PROVIDER: Hillman Amour, MD  END OF SESSION:  OT End of Session - 07/09/24 1428     Visit Number 14    Number of Visits 17    Date for Recertification  07/17/24    Authorization Type Medicare Part A and B    Progress Note Due on Visit 19    OT Start Time 1336    OT Stop Time 1423    OT Time Calculation (min) 47 min    Activity Tolerance Patient tolerated treatment well    Behavior During Therapy WFL for tasks assessed/performed               Past Medical History:  Diagnosis Date   Anxiety    Arthritis    Basal cell carcinoma    Charcot's joint of foot    bilateral   Diabetes mellitus without complication (HCC)    Fibromyalgia    Neuropathy    Past Surgical History:  Procedure Laterality Date   ACHILLES TENDON REPAIR Left    bone spur Left    --left foot   BREAST SURGERY     benign breast bx--left--fatty tissue   CARPAL TUNNEL RELEASE Bilateral    COLONOSCOPY     COLONOSCOPY WITH PROPOFOL  N/A 09/06/2022   Procedure: COLONOSCOPY WITH PROPOFOL ;  Surgeon: Shaaron Lamar HERO, MD;  Location: [facility];  Service: Endoscopy;  Laterality: N/A;  10:30am, asa 2   DILATION AND CURETTAGE OF UTERUS  2011   for cervical polyp   FOOT SURGERY Left 03/2018   reconstructive surgery   POLYPECTOMY  09/06/2022   Procedure: POLYPECTOMY;  Surgeon: Shaaron Lamar HERO, MD;  Location: [facility];  Service: Endoscopy;;   Patient Active Problem List   Diagnosis Date Noted   Anxiety 06/28/2022   Arthritis 06/28/2022   Body fluid retention 06/28/2022   Hyperglycemia due to type 2 diabetes mellitus (HCC) 06/28/2022   Mixed hyperlipidemia 06/28/2022   Fibromyalgia 06/28/2022   Cough 10/25/2021   Musculoskeletal pain 05/21/2012   Stiffness of joint, not elsewhere classified,  ankle and foot 08/13/2011   Difficulty walking 08/13/2011   Ankle weakness 08/13/2011    ONSET DATE: 08/2023  REFERRING DIAG:  G95.20 (ICD-10-CM) - Unspecified cord compression  R25.2 (ICD-10-CM) - Spastic   THERAPY DIAG:  Other lack of coordination  Other symptoms and signs involving the nervous system  Rationale for Evaluation and Treatment: Rehabilitation  SUBJECTIVE:   SUBJECTIVE STATEMENT: S: I had one day that my arms were not nearly as numb as they normally are  PERTINENT HISTORY: Pt reported all this started with UTI that caused fracture f neck and indicated as C4-C7 fusion. Pt had multiple bouts of acute and inpatient rehab stays due to complications. Significant BP issues throughout this time. Had stay in multiple facilities. Pt reported various changes in functional status, was requiring assist for ADLs and mobility since April 28th. Since then, pt has been home with Home Health services. Pt continues to report numbness in BUE and poor grip strength. Pt reports neuropathy in BLE. Prior to onset of November '24 was walking daily for 45-60 minutes.   PRECAUTIONS: Fall  WEIGHT BEARING RESTRICTIONS: No  PAIN:  Are you having pain? Yes: NPRS scale: 2/10 Pain location: left upper arm Pain description: sore Aggravating factors: lifting Relieving factors: rest  FALLS: Has patient fallen in last 6 months? No  LIVING ENVIRONMENT: Lives with: lives with their spouse Lives in: House/apartment Stairs: Pt lives in the basement with no stairs Has following equipment at home: Vannie - 2 wheeled and shower chair  PLOF: Independent  PATIENT GOALS: To improve mobility and strengthening  OBJECTIVE:  Note: Objective measures were completed at Evaluation unless otherwise noted.  HAND DOMINANCE: Right  ADLs: Overall ADLs: Pt has difficulty with all small object manipulation, including but not limited to buttons, shoe laces, and zippers. Additionally she has max difficulty  with cooking and cleaning due to weakness in both hands and wrists.   MOBILITY STATUS: Hx of falls, difficulty with turns, and difficulty carrying objections with ambulation  POSTURE COMMENTS:  rounded shoulders Sitting balance: Moves/returns truncal midpoint 1-2 inches in multiple planes  ACTIVITY TOLERANCE: Activity tolerance: Fair tolerance - fatigues quicker than normal  FUNCTIONAL OUTCOME MEASURES: Quick Dash: 47.73  UPPER EXTREMITY ROM:    All ROM is WFL  UPPER EXTREMITY MMT:     MMT Right eval Left eval  Shoulder flexion 5/5 5/5  Shoulder abduction 5/5 5/5  Shoulder internal rotation 5/5 5/5  Shoulder external rotation 5/5 5/5  Elbow flexion 5/5 5/5  Elbow extension 5/5 5/5  Wrist flexion 4/5 4+/5  Wrist extension 4-/5 4/5  Wrist ulnar deviation 4-/5 4/5  Wrist radial deviation 4-/5 4-/5  Wrist pronation 4/5 4+/5  Wrist supination 4/5 4/5  (Blank rows = not tested)  HAND FUNCTION: Grip strength: Right: 20 lbs; Left: 27 lbs, Lateral pinch: Right: 5 lbs, Left: 9 lbs, and 3 point pinch: Right: 5 lbs, Left: 4 lbs  COORDINATION: 06/30/24-9 Hole Peg test: Right: 33.28 sec; Left: 38.26 sec  SENSATION: Pt has full numbness along median nerve of R hand as well as dulled sensation and tingling in BUE up to elbows.   EDEMA: No swelling noted  OBSERVATIONS: adducted thumbs and 80% grip                                                                                                                             TREATMENT DATE:  07/09/24 -Sponges: left hand-23, 23; right-12, 14 -Pinch strengthening: pt using red clothespin and 3 point pinch to grasp and stack 3 towers of 5 sponges with left hand, removing and replacing in bucket with right hand and lateral pinch; unable to maintain 3 point pinch with right hand 3rd digit -Wrist strengthening: red band-flexion, extension, ulnar/radial deviation, supination/Pronation, 10 reps -Hand gripper, BUE: Right-large and medium beads  with gripper horizontal at 25#; Left-large and medium beads gripper horizontal at 25# -Flex bar: yellow-wrist supination, pronation, 10 reps  07/02/24 -Wrist strengthening: red band-flexion, extension, ulnar/radial deviation, supination/Pronation, 10 reps -Hand gripper, BUE: Right-large and medium beads with gripper vertical at 25#; Left-large and medium beads gripper vertical at 25# -Theraputty: red-flatten, cutting circles with pvc pipe working on grip and wrist stability, pvc pipe pull  06/30/24 -Wrist  strengthening: 2#-flexion, extension, ulnar/radial deviation, supination/Pronation, 12 reps -Sponges: left hand-23; right-19 -Hand gripper, BUE: Right-large and medium beads with gripper vertical and horizontal at 25#; Left-large and medium beads gripper horizontal at 25# -Pinch strengthening: pt using red clothespin and 3 point pinch to grasp and stack 4 towers of 5 sponges with left hand, removing and replacing in bucket with right hand and lateral pinch; unable to maintain 3 point pinch with right hand 3rd digit     PATIENT EDUCATION: Education details: theraband wrist strengthening-red Person educated: Patient Education method: Explanation, Demonstration, and Handouts Education comprehension: verbalized understanding and returned demonstration  HOME EXERCISE PROGRAM: 8/1: Wrist ROM and Digit ROM 8/8: Shoulder A/ROM 8/20: Scapular Strengthening 9/4: Coin work 9/18: theraband wrist strengthening-red   GOALS: Goals reviewed with patient? Yes  SHORT TERM GOALS: Target date: 06/19/24  Pt will be provided and educated on a comprehensive HEP for BUE mobility in order to complete ADL's and IADL's independently.   Goal status: IN PROGRESS  2.  Pt will improve BUE strength to 5/5 in order to lift and carry items during cooking and cleaning tasks.   Goal status: IN PROGRESS  3.  Pt will improve BUE grip by 15# and pinch by 3# in order to grasp and hold pots and pans.  Goal status:  IN PROGRESS  4.  Pt will improve BUE coordination by completing 9 hole peg test in 45 or less in order to manipulate and complete fine motor tasks.   Goal status: MET   ASSESSMENT:  CLINICAL IMPRESSION:  Continued with wrist strengthening and stability work with red theraband, updated HEP to include theraband as well. Pt did great following written instructions of HEP and using correct form. Pt with more difficulty using hand gripper today, required to hold in horizontal position versus vertical for successful completion. Pt reports one day this week she had significantly less numbness in her arms, otherwise has remained the same. Verbal cuing for form and technique, activities targeting functional use of BUE required for ADL completion.    PERFORMANCE DEFICITS: in functional skills including ADLs, IADLs, coordination, dexterity, sensation, ROM, strength, pain, fascial restrictions, Fine motor control, Gross motor control, body mechanics, and UE functional use.    PLAN:  OT FREQUENCY: 2x/week  OT DURATION: 4 weeks  PLANNED INTERVENTIONS: 97168 OT Re-evaluation, 97535 self care/ADL training, 02889 therapeutic exercise, 97530 therapeutic activity, 97112 neuromuscular re-education, 97140 manual therapy, 97035 ultrasound, 97018 paraffin, 02989 moist heat, 97032 electrical stimulation (manual), passive range of motion, functional mobility training, energy conservation, coping strategies training, patient/family education, and DME and/or AE instructions  CONSULTED AND AGREED WITH PLAN OF CARE: Patient  PLAN FOR NEXT SESSION: Wrist and Digit ROM, strengthening, coordination tasks   Sonny Cory, OTR/L  [phone] 07/09/2024, 2:29 PM

## 2024-07-09 NOTE — Therapy (Signed)
 OUTPATIENT PHYSICAL THERAPY NEURO TREATMENT   Patient Name: Angela Norman MRN: [id number] DOB:1956-04-07, 68 y.o., female Today's Date: 07/09/2024   PCP: Sebastian Othel GAILS, FNP REFERRING PROVIDER: Edna Norris, MD  END OF SESSION:  PT End of Session - 07/09/24 1435     Visit Number 15    Number of Visits 24    Date for Recertification  07/28/24    Authorization Type Medicare A & B    PT Start Time 1435    PT Stop Time 1518    PT Time Calculation (min) 43 min    Equipment Utilized During Treatment Gait belt    Activity Tolerance Patient tolerated treatment well    Behavior During Therapy WFL for tasks assessed/performed               Past Medical History:  Diagnosis Date   Anxiety    Arthritis    Basal cell carcinoma    Charcot's joint of foot    bilateral   Diabetes mellitus without complication (HCC)    Fibromyalgia    Neuropathy    Past Surgical History:  Procedure Laterality Date   ACHILLES TENDON REPAIR Left    bone spur Left    --left foot   BREAST SURGERY     benign breast bx--left--fatty tissue   CARPAL TUNNEL RELEASE Bilateral    COLONOSCOPY     COLONOSCOPY WITH PROPOFOL  N/A 09/06/2022   Procedure: COLONOSCOPY WITH PROPOFOL ;  Surgeon: Shaaron Lamar HERO, MD;  Location: [facility];  Service: Endoscopy;  Laterality: N/A;  10:30am, asa 2   DILATION AND CURETTAGE OF UTERUS  2011   for cervical polyp   FOOT SURGERY Left 03/2018   reconstructive surgery   POLYPECTOMY  09/06/2022   Procedure: POLYPECTOMY;  Surgeon: Shaaron Lamar HERO, MD;  [facility]: [facility];  Service: Endoscopy;;   Patient Active Problem List   Diagnosis Date Noted   Anxiety 06/28/2022   Arthritis 06/28/2022   Body fluid retention 06/28/2022   Hyperglycemia due to type 2 diabetes mellitus (HCC) 06/28/2022   Mixed hyperlipidemia 06/28/2022   Fibromyalgia 06/28/2022   Cough 10/25/2021   Musculoskeletal pain 05/21/2012   Stiffness of joint, not elsewhere  classified, ankle and foot 08/13/2011   Difficulty walking 08/13/2011   Ankle weakness 08/13/2011    ONSET DATE: November 2024  REFERRING DIAG: compression of spinal cord   THERAPY DIAG:  Compression of spinal cord (HCC)  Impaired functional mobility, balance, gait, and endurance  Muscle weakness (generalized)  Rationale for Evaluation and Treatment: Rehabilitation  SUBJECTIVE:  SUBJECTIVE STATEMENT: Feeling good today, has some pain Lt arm today 1/10.  Measured steps at home and range from 9-10in height.  Got handrails installed by stairs this week.  Eval:  Pt reported all this started with UTI that caused fracture of neck and indicated as C4-C7 fusion. Pt had multiple bouts of acute and inpatient rehab stays due to complications. Significant BP issues throughout this time. Had stay in multiple facilities. Pt reported various changes in functional status, was requiring assist for ADLs and mobility since April 28th. Since then, pt has been home with Home Health services. Pt continues to report numbness in BUE and poor grip strength. Pt reports neuropathy in BLE. Prior to onset of November '24 was walking daily for 45-60 minutes.   Pt has CT scan for nodule on Lung on 04/28/24.  Pt accompanied by: self  PERTINENT HISTORY:  Cervical Myelopathy with ACDF 4-7 in November 2024\ Left ankle surgery x 3  PAIN:  Are you having pain? No and chronic pain in left shoulder  PRECAUTIONS: None  RED FLAGS: Bowel or bladder incontinence: Yes: SCI   WEIGHT BEARING RESTRICTIONS: No  FALLS: Has patient fallen in last 6 months? Yes. Number of falls 1-2   PATIENT GOALS:  want to walk with AD well and with confidence and strengthening RUE.  OBJECTIVE:  Note: Objective measures were completed at Evaluation  unless otherwise noted.  DIAGNOSTIC FINDINGS:   COGNITION: Overall cognitive status: Within functional limits for tasks assessed   SENSATION: Light touch: decreased light touch from lateral malleolus distally.   LOWER EXTREMITY ROM:     Active  Right Eval Left Eval  Hip flexion    Hip extension    Hip abduction    Hip adduction    Hip internal rotation    Hip external rotation    Knee flexion    Knee extension    Ankle dorsiflexion    Ankle plantarflexion    Ankle inversion    Ankle eversion     (Blank rows = not tested)  LOWER EXTREMITY MMT:    MMT Right Eval Left Eval Right 06/25/24 Left 06/25/24  Hip flexion      Hip extension      Hip abduction      Hip adduction      Hip internal rotation      Hip external rotation      Knee flexion      Knee extension 4- 4+ 4+ 5  Ankle dorsiflexion      Ankle plantarflexion      Ankle inversion      Ankle eversion      (Blank rows = not tested)   TRANSFERS: Sit to stand: Modified independence  Assistive device utilized: Environmental consultant - 2 wheeled     Stand to sit: Modified independence  Assistive device utilized: Environmental consultant - 2 wheeled     Chair to chair: SBA  Assistive device utilized: Environmental consultant - 2 wheeled       GAIT: Findings: Gait Characteristics: step through pattern, decreased arm swing- Right, decreased arm swing- Left, decreased step length- Right, decreased step length- Left, decreased hip/knee flexion- Right, decreased hip/knee flexion- Left, Right foot flat, Left foot flat, and narrow BOS, Distance walked: 84ft, Assistive device utilized:Walker - 2 wheeled, Level of assistance: Modified independence, and Comments:    FUNCTIONAL TESTS:  TUG: 25.71 seconds  DGI: 05/13/24: 8 / 24  L SLS: <0.1 seconds  R SLS: <0.1 seconds  [place]orms: 18-39  F:  43.5 seconds  M: 43.2 seconds 40-49  F: 40.4 seconds  M: 40.1 seconds 50-59  F: 36 seconds  M: 38.1 seconds 60-69  F: 25.1 seconds  M: 28.7 seconds 70-79  F: 11.3  seconds  M: 18.3 seconds  PATIENT SURVEYS:  ABC Scale:950 / 1600 = 59.4 %                                                                                                                              TREATMENT DATE:  07/09/24: -Nustep seat 8 x 5' level 7, pt avgs 70 SPM; [place] UE/LE - STS (no HHA) to heel raise (with HHA) 15x - Step up and overs, with 2 UE support, 6 inch step, 1 set of 3 reps (increased difficulty with 1 UE support) - Lateral step up 6in 2 UE support 15x - Tandem stance with one foot on 6in step/other on floor 2x 30 then 5 head turns each foot on step - 12in hurdles forward and sidestep over 2RT with BUE support   07/07/24:   - Nustep UE/LE x 5' L6 [place] average SPM 70 - 12in hurdles forward and sidestep over 2RT with BUE support (attempted 1 but increased instability) - Step up and overs, with only 1 UE support, 6 inch step, 1 set of 3 reps - 5 Sit to stand no UE support 14.87 - Squats front of chair 10x3 - Heel raise with UE support for improved ROM 2x 10 on incline slope - Tandem stance with one foot on 6in step/other on floor intermittent HHA required 2x 30    07/02/2024  Therapeutic Exercise: -Nustep seat 8 x 5' level 6, pt avgs 70 SPM -Leg press 3 sets of 10 reps plate 3>4, pt cued for LE alignment -Heel raises, 2 sets of 10 reps, pt cued for UE support for increased ROM  Therapeutic Activity: -Sit to stands from standard chair - 6 reps pt cued for core activation -Sled push, 20lbs, 2 laps, 50 foot laps, pt cued for consistent movement of sled and educated on functionality -Speed step ups, 2 bouts of 30 second trials (8.5 first set, 9.5 second set), pt cued for sequencing and increased speed -Step up and overs, with only 1 UE support, 6 inch step, 1 set of 3 reps    06/25/24 Progress note ABC 58.8% MMT's knee extension left 5; right 4+ TUG 21.38 sec with RW SLS left 2 and right 1  DGI 1. Gait level surface (2) Mild Impairment: Walks  20', uses assistive devices, slower speed, mild gait deviations. 2. Change in gait speed (2) Mild Impairment: Is able to change speed but demonstrates mild gait deviations, or not gait deviations but unable to achieve a significant change in velocity, or uses an assistive device. 3. Gait with horizontal head turns (2) Mild Impairment: Performs head turns smoothly with slight change in gait velocity, i.e., minor disruption to smooth gait path or uses walking aid. 4.  Gait with vertical head turns (2) Mild Impairment: Performs head turns smoothly with slight change in gait velocity, i.e., minor disruption to smooth gait path or uses walking aid. 5. Gait and pivot turn (2) Mild Impairment: Pivot turns safely in > 3 seconds and stops with no loss of balance. 6. Step over obstacle (1) Moderate Impairment: Is able to step over box but must stop, then step over. May require verbal cueing. 7. Step around obstacles (2) Mild Impairment: Is able to step around both cones, but must slow down and adjust steps to clear cones. 8. Stairs (1) Moderate Impairment: Two feet to a stair, must use rail.  TOTAL SCORE: 14 / 24   PATIENT EDUCATION: Education details: PT Evaluation, findings, prognosis, frequency, attendance policy. Person educated: Patient Education method: Explanation and Demonstration Education comprehension: verbalized understanding  HOME EXERCISE PROGRAM: Access Code: [email or web address] URL: [email or web address] Date: 05/14/2024 Prepared by: Augustin Mclean  Exercises - Sit to Stand  - 1 x daily - 7 x weekly - 3 sets - 10 reps - Standing March with Counter Support  - 1 x daily - 7 x weekly - 3 sets - 10 reps - Standing Hip Abduction with Counter Support  - 1 x daily - 7 x weekly - 3 sets - 10 reps - Side Stepping with Resistance at Thighs and Counter Support  - 1 x daily - 7 x weekly - 3 sets - 10 reps  06/30/24: - Squat with Chair and Counter Support  - 2 x daily - 7 x weekly - 1  sets - 10 reps - Standing Tandem Balance with Counter Support  - 1 x daily - 7 x weekly - 1 sets - 3 reps - 30 hold  GOALS: Goals reviewed with patient? No  SHORT TERM GOALS: Target date: 05/25/2024  Pt will be independent with HEP in order to demonstrate participation in Physical Therapy POC.  Baseline: Goal status: MET  2.  Pt will improve ABC score by 15% in order to demonstrate improved pain with functional goals and outcomes. Baseline:  Goal status: IN PROGRESS  LONG TERM GOALS: Target date: 06/22/2024  Pt will increase DGI score by > MCID in order to demonstrate improved functional safety and balance skills in ADL/mobility.   Baseline: see objective. 06/25/24  14/24 Goal status: IN PROGRESS  2.  Pt will improve TUG by least 3 seconds in order to demonstrate improved functional mobility capacity in community setting.  Baseline: see objective. 06/25/24 21.38 sec with RW Goal status: MET  3.  Pt will improve ABC score by 30% in order to demonstrate improved pain with functional goals and outcomes. Baseline: see objective.  06/25/24 58.8% Goal status: IN PROGRESS  4.  Pt will improve single leg balance by at least 3 seconds bilaterally in order to demonstrate improved capacity, safety, and overall quality of movement to optimize function.  Baseline: see objective. 06/25/24 2 on left and 1 on right Goal status: IN PROGRESS  ASSESSMENT:  CLINICAL IMPRESSION: Continued session focus with LE strengthening and balance.  Pt presents with improved activity tolerance with no rest breaks required through session.  Continues to demonstrate weakness and instability with required UE support for safety and proper form/mechanics with functional exercises.  Attempted 1 UE with balance activities and step up training, increased difficulty and heavy UE support.  Added head turns with static tandem stance with intermittent UE support required.  No reports of pain through session and appropriate fatigue  levels at EOS.  Eval:  Patient is a 68y.o. female who was seen today for physical therapy evaluation and treatment for 'compression of spinal cord.'  Patient with history of myelopathy C5 spinal fracture, with A C4 through C7 cervical fusion.  Patient presents with significant functional mobility deficits, safety concerns, increased falls risk, reduced ambulation capacity at due to muscle weakness, decreased activity tolerance, balance deficits in setting of previous cervical myelopathy pt will benefit from skilled Physical Therapy services to address deficits/limitations in order to improve functional and QOL.    OBJECTIVE IMPAIRMENTS: Abnormal gait, decreased activity tolerance, decreased balance, decreased mobility, difficulty walking, decreased strength, and postural dysfunction.   ACTIVITY LIMITATIONS: carrying, lifting, bending, standing, squatting, stairs, transfers, bed mobility, and locomotion level  PARTICIPATION LIMITATIONS: meal prep, cleaning, laundry, community activity, occupation, and yard work  PERSONAL FACTORS: Age are also affecting patient's functional outcome.   REHAB POTENTIAL: Excellent  CLINICAL DECISION MAKING: Stable/uncomplicated  EVALUATION COMPLEXITY: Low  PLAN:  PT FREQUENCY: 2x/week  PT DURATION: 4 weeks  PLANNED INTERVENTIONS: 97164- PT Re-evaluation, 97750- Physical Performance Testing, 97110-Therapeutic exercises, 97530- Therapeutic activity, 97112- Neuromuscular re-education, 97535- Self Care, 02859- Manual therapy, [phone]- Gait training, [phone]- Orthotic Initial, Patient/Family education, Balance training, Stair training, Taping, Joint mobilization, Joint manipulation, Spinal manipulation, Spinal mobilization, Cryotherapy, and Moist heat  PLAN FOR NEXT SESSION: Functional mobility, strengthening, ambulation, etc.   Augustin Mclean, LPTA/CLT; [facility] [phone]  4:29 PM, 07/09/24

## 2024-07-14 ENCOUNTER — Ambulatory Visit (HOSPITAL_COMMUNITY)

## 2024-07-14 ENCOUNTER — Encounter (HOSPITAL_COMMUNITY): Payer: Self-pay

## 2024-07-14 ENCOUNTER — Encounter (HOSPITAL_COMMUNITY): Payer: Self-pay | Admitting: Occupational Therapy

## 2024-07-14 ENCOUNTER — Ambulatory Visit (HOSPITAL_COMMUNITY): Admitting: Occupational Therapy

## 2024-07-14 DIAGNOSIS — G9520 Unspecified cord compression: Secondary | ICD-10-CM

## 2024-07-14 DIAGNOSIS — R278 Other lack of coordination: Secondary | ICD-10-CM

## 2024-07-14 DIAGNOSIS — M6281 Muscle weakness (generalized): Secondary | ICD-10-CM

## 2024-07-14 DIAGNOSIS — Z7409 Other reduced mobility: Secondary | ICD-10-CM

## 2024-07-14 DIAGNOSIS — R29818 Other symptoms and signs involving the nervous system: Secondary | ICD-10-CM

## 2024-07-14 NOTE — Therapy (Signed)
 OUTPATIENT PHYSICAL THERAPY NEURO TREATMENT   Patient Name: Angela Norman MRN: [id number] DOB:1956/05/06, 68 y.o., female Today's Date: 07/14/2024   PCP: Sebastian Othel GAILS, FNP REFERRING PROVIDER: Edna Norris, MD  END OF SESSION:  PT End of Session - 07/14/24 1456     Visit Number 16    Number of Visits 24    Date for Recertification  07/28/24    Authorization Type Medicare A & B    PT Start Time 1503    PT Stop Time 1543    PT Time Calculation (min) 40 min    Equipment Utilized During Treatment Gait belt    Activity Tolerance Patient tolerated treatment well    Behavior During Therapy WFL for tasks assessed/performed               Past Medical History:  Diagnosis Date   Anxiety    Arthritis    Basal cell carcinoma    Charcot's joint of foot    bilateral   Diabetes mellitus without complication (HCC)    Fibromyalgia    Neuropathy    Past Surgical History:  Procedure Laterality Date   ACHILLES TENDON REPAIR Left    bone spur Left    --left foot   BREAST SURGERY     benign breast bx--left--fatty tissue   CARPAL TUNNEL RELEASE Bilateral    COLONOSCOPY     COLONOSCOPY WITH PROPOFOL  N/A 09/06/2022   Procedure: COLONOSCOPY WITH PROPOFOL ;  Surgeon: Shaaron Lamar HERO, MD;  Location: [facility];  Service: Endoscopy;  Laterality: N/A;  10:30am, asa 2   DILATION AND CURETTAGE OF UTERUS  2011   for cervical polyp   FOOT SURGERY Left 03/2018   reconstructive surgery   POLYPECTOMY  09/06/2022   Procedure: POLYPECTOMY;  Surgeon: Shaaron Lamar HERO, MD;  Location: [facility];  Service: Endoscopy;;   Patient Active Problem List   Diagnosis Date Noted   Anxiety 06/28/2022   Arthritis 06/28/2022   Body fluid retention 06/28/2022   Hyperglycemia due to type 2 diabetes mellitus (HCC) 06/28/2022   Mixed hyperlipidemia 06/28/2022   Fibromyalgia 06/28/2022   Cough 10/25/2021   Musculoskeletal pain 05/21/2012   Stiffness of joint, not elsewhere  classified, ankle and foot 08/13/2011   Difficulty walking 08/13/2011   Ankle weakness 08/13/2011    ONSET DATE: November 2024  REFERRING DIAG: compression of spinal cord   THERAPY DIAG:  Other symptoms and signs involving the nervous system  Compression of spinal cord (HCC)  Impaired functional mobility, balance, gait, and endurance  Muscle weakness (generalized)  Rationale for Evaluation and Treatment: Rehabilitation  SUBJECTIVE:  SUBJECTIVE STATEMENT: 07/14/24:  Pain with movements Lt arm, pain scale 2/10.  Wishes to work on balance today.  Able to do 19 laps walking around house.  Eval:  Pt reported all this started with UTI that caused fracture of neck and indicated as C4-C7 fusion. Pt had multiple bouts of acute and inpatient rehab stays due to complications. Significant BP issues throughout this time. Had stay in multiple facilities. Pt reported various changes in functional status, was requiring assist for ADLs and mobility since April 28th. Since then, pt has been home with Home Health services. Pt continues to report numbness in BUE and poor grip strength. Pt reports neuropathy in BLE. Prior to onset of November '24 was walking daily for 45-60 minutes.   Pt has CT scan for nodule on Lung on 04/28/24.  Pt accompanied by: self  PERTINENT HISTORY:  Cervical Myelopathy with ACDF 4-7 in November 2024\ Left ankle surgery x 3  PAIN:  Are you having pain? No and chronic pain in left shoulder  PRECAUTIONS: None  RED FLAGS: Bowel or bladder incontinence: Yes: SCI   WEIGHT BEARING RESTRICTIONS: No  FALLS: Has patient fallen in last 6 months? Yes. Number of falls 1-2   PATIENT GOALS:  want to walk with AD well and with confidence and strengthening RUE.  OBJECTIVE:  Note: Objective  measures were completed at Evaluation unless otherwise noted.  DIAGNOSTIC FINDINGS:   COGNITION: Overall cognitive status: Within functional limits for tasks assessed   SENSATION: Light touch: decreased light touch from lateral malleolus distally.   LOWER EXTREMITY ROM:     Active  Right Eval Left Eval  Hip flexion    Hip extension    Hip abduction    Hip adduction    Hip internal rotation    Hip external rotation    Knee flexion    Knee extension    Ankle dorsiflexion    Ankle plantarflexion    Ankle inversion    Ankle eversion     (Blank rows = not tested)  LOWER EXTREMITY MMT:    MMT Right Eval Left Eval Right 06/25/24 Left 06/25/24  Hip flexion      Hip extension      Hip abduction      Hip adduction      Hip internal rotation      Hip external rotation      Knee flexion      Knee extension 4- 4+ 4+ 5  Ankle dorsiflexion      Ankle plantarflexion      Ankle inversion      Ankle eversion      (Blank rows = not tested)   TRANSFERS: Sit to stand: Modified independence  Assistive device utilized: Environmental consultant - 2 wheeled     Stand to sit: Modified independence  Assistive device utilized: Environmental consultant - 2 wheeled     Chair to chair: SBA  Assistive device utilized: Environmental consultant - 2 wheeled       GAIT: Findings: Gait Characteristics: step through pattern, decreased arm swing- Right, decreased arm swing- Left, decreased step length- Right, decreased step length- Left, decreased hip/knee flexion- Right, decreased hip/knee flexion- Left, Right foot flat, Left foot flat, and narrow BOS, Distance walked: 53ft, Assistive device utilized:Walker - 2 wheeled, Level of assistance: Modified independence, and Comments:    FUNCTIONAL TESTS:  TUG: 25.71 seconds  DGI: 05/13/24: 8 / 24  L SLS: <0.1 seconds  R SLS: <0.1 seconds  [place]orms: 18-39  F: 43.5 seconds  M: 43.2 seconds 40-49  F: 40.4 seconds  M: 40.1 seconds 50-59  F: 36 seconds  M: 38.1 seconds 60-69  F: 25.1 seconds  M:  28.7 seconds 70-79  F: 11.3 seconds  M: 18.3 seconds  PATIENT SURVEYS:  ABC Scale:950 / 1600 = 59.4 %                                                                                                                              TREATMENT DATE:  07/14/24: - Nustep seat 8 x 5' level 7, pt avgs 70 SPM; [place] UE/LE - Foam step up and down with 1 HHA - Standing on faom no HHA, able to stand for a minutes without - STS holding Tidal tank 15x - Sidestep holding Tidal tank 2RT inside // bars with min A - Step up and over with 2 UE support 6in step height - Standing no HHA with head turns 2x 10 - Standing with UE flexion alternating 20x - Toe tapping 6in alteranating 20x 1 HHA - Lateral foot tapping between 6 and 8in step (required Rt UE support while using Rt LE) 2 HHA  07/09/24: -Nustep seat 8 x 5' level 7, pt avgs 70 SPM; [place] UE/LE - STS (no HHA) to heel raise (with HHA) 15x - Step up and overs, with 2 UE support, 6 inch step, 1 set of 3 reps (increased difficulty with 1 UE support) - Lateral step up 6in 2 UE support 15x - Tandem stance with one foot on 6in step/other on floor 2x 30 then 5 head turns each foot on step - 12in hurdles forward and sidestep over 2RT with BUE support   07/07/24:   - Nustep UE/LE x 5' L6 [place] average SPM 70 - 12in hurdles forward and sidestep over 2RT with BUE support (attempted 1 but increased instability) - Step up and overs, with only 1 UE support, 6 inch step, 1 set of 3 reps - 5 Sit to stand no UE support 14.87 - Squats front of chair 10x3 - Heel raise with UE support for improved ROM 2x 10 on incline slope - Tandem stance with one foot on 6in step/other on floor intermittent HHA required 2x 30    07/02/2024  Therapeutic Exercise: -Nustep seat 8 x 5' level 6, pt avgs 70 SPM -Leg press 3 sets of 10 reps plate 3>4, pt cued for LE alignment -Heel raises, 2 sets of 10 reps, pt cued for UE support for increased ROM  Therapeutic  Activity: -Sit to stands from standard chair - 6 reps pt cued for core activation -Sled push, 20lbs, 2 laps, 50 foot laps, pt cued for consistent movement of sled and educated on functionality -Speed step ups, 2 bouts of 30 second trials (8.5 first set, 9.5 second set), pt cued for sequencing and increased speed -Step up and overs, with only 1 UE support, 6 inch step, 1 set of 3 reps    06/25/24  Progress note ABC 58.8% MMT's knee extension left 5; right 4+ TUG 21.38 sec with RW SLS left 2 and right 1  DGI 1. Gait level surface (2) Mild Impairment: Walks 20', uses assistive devices, slower speed, mild gait deviations. 2. Change in gait speed (2) Mild Impairment: Is able to change speed but demonstrates mild gait deviations, or not gait deviations but unable to achieve a significant change in velocity, or uses an assistive device. 3. Gait with horizontal head turns (2) Mild Impairment: Performs head turns smoothly with slight change in gait velocity, i.e., minor disruption to smooth gait path or uses walking aid. 4. Gait with vertical head turns (2) Mild Impairment: Performs head turns smoothly with slight change in gait velocity, i.e., minor disruption to smooth gait path or uses walking aid. 5. Gait and pivot turn (2) Mild Impairment: Pivot turns safely in > 3 seconds and stops with no loss of balance. 6. Step over obstacle (1) Moderate Impairment: Is able to step over box but must stop, then step over. May require verbal cueing. 7. Step around obstacles (2) Mild Impairment: Is able to step around both cones, but must slow down and adjust steps to clear cones. 8. Stairs (1) Moderate Impairment: Two feet to a stair, must use rail.  TOTAL SCORE: 14 / 24   PATIENT EDUCATION: Education details: PT Evaluation, findings, prognosis, frequency, attendance policy. Person educated: Patient Education method: Explanation and Demonstration Education comprehension: verbalized  understanding  HOME EXERCISE PROGRAM: Access Code: [email or web address] URL: [email or web address] Date: 05/14/2024 Prepared by: Augustin Mclean  Exercises - Sit to Stand  - 1 x daily - 7 x weekly - 3 sets - 10 reps - Standing March with Counter Support  - 1 x daily - 7 x weekly - 3 sets - 10 reps - Standing Hip Abduction with Counter Support  - 1 x daily - 7 x weekly - 3 sets - 10 reps - Side Stepping with Resistance at Thighs and Counter Support  - 1 x daily - 7 x weekly - 3 sets - 10 reps  06/30/24: - Squat with Chair and Counter Support  - 2 x daily - 7 x weekly - 1 sets - 10 reps - Standing Tandem Balance with Counter Support  - 1 x daily - 7 x weekly - 1 sets - 3 reps - 30 hold  GOALS: Goals reviewed with patient? No  SHORT TERM GOALS: Target date: 05/25/2024  Pt will be independent with HEP in order to demonstrate participation in Physical Therapy POC.  Baseline: Goal status: MET  2.  Pt will improve ABC score by 15% in order to demonstrate improved pain with functional goals and outcomes. Baseline:  Goal status: IN PROGRESS  LONG TERM GOALS: Target date: 06/22/2024  Pt will increase DGI score by > MCID in order to demonstrate improved functional safety and balance skills in ADL/mobility.   Baseline: see objective. 06/25/24  14/24 Goal status: IN PROGRESS  2.  Pt will improve TUG by least 3 seconds in order to demonstrate improved functional mobility capacity in community setting.  Baseline: see objective. 06/25/24 21.38 sec with RW Goal status: MET  3.  Pt will improve ABC score by 30% in order to demonstrate improved pain with functional goals and outcomes. Baseline: see objective.  06/25/24 58.8% Goal status: IN PROGRESS  4.  Pt will improve single leg balance by at least 3 seconds bilaterally in order to demonstrate improved capacity, safety, and overall quality of movement to optimize  function.  Baseline: see objective. 06/25/24 2 on left and 1 on right Goal status:  IN PROGRESS  ASSESSMENT:  CLINICAL IMPRESSION: Pt improved static balance without HHA, does continue to require UE support during functional strengthening with marching and stepping up.  Presents with improved activity tolerance as well, no rest breaks required this session.   Eval:  Patient is a 68y.o. female who was seen today for physical therapy evaluation and treatment for 'compression of spinal cord.'  Patient with history of myelopathy C5 spinal fracture, with A C4 through C7 cervical fusion.  Patient presents with significant functional mobility deficits, safety concerns, increased falls risk, reduced ambulation capacity at due to muscle weakness, decreased activity tolerance, balance deficits in setting of previous cervical myelopathy pt will benefit from skilled Physical Therapy services to address deficits/limitations in order to improve functional and QOL.    OBJECTIVE IMPAIRMENTS: Abnormal gait, decreased activity tolerance, decreased balance, decreased mobility, difficulty walking, decreased strength, and postural dysfunction.   ACTIVITY LIMITATIONS: carrying, lifting, bending, standing, squatting, stairs, transfers, bed mobility, and locomotion level  PARTICIPATION LIMITATIONS: meal prep, cleaning, laundry, community activity, occupation, and yard work  PERSONAL FACTORS: Age are also affecting patient's functional outcome.   REHAB POTENTIAL: Excellent  CLINICAL DECISION MAKING: Stable/uncomplicated  EVALUATION COMPLEXITY: Low  PLAN:  PT FREQUENCY: 2x/week  PT DURATION: 4 weeks  PLANNED INTERVENTIONS: 97164- PT Re-evaluation, 97750- Physical Performance Testing, 97110-Therapeutic exercises, 97530- Therapeutic activity, 97112- Neuromuscular re-education, 97535- Self Care, 02859- Manual therapy, [phone]- Gait training, [phone]- Orthotic Initial, Patient/Family education, Balance training, Stair training, Taping, Joint mobilization, Joint manipulation, Spinal manipulation, Spinal  mobilization, Cryotherapy, and Moist heat  PLAN FOR NEXT SESSION: Functional mobility, strengthening, ambulation, etc.   Augustin Mclean, LPTA/CLT; [facility] [phone]  3:54 PM, 07/14/24

## 2024-07-14 NOTE — Therapy (Signed)
 OUTPATIENT OCCUPATIONAL THERAPY NEURO TREATMENT NOTE  Patient Name: Angela Norman MRN: [id number] DOB:1956-08-14, 68 y.o., female Today's Date: 07/14/2024     PCP: Shona Norleen PEDLAR, MD REFERRING PROVIDER: Hillman Amour, MD  END OF SESSION:  OT End of Session - 07/14/24 1450     Visit Number 15    Number of Visits 17    Date for Recertification  07/17/24    Authorization Type Medicare Part A and B    Progress Note Due on Visit 19    OT Start Time 1350    OT Stop Time 1432    OT Time Calculation (min) 42 min    Activity Tolerance Patient tolerated treatment well    Behavior During Therapy WFL for tasks assessed/performed         Past Medical History:  Diagnosis Date   Anxiety    Arthritis    Basal cell carcinoma    Charcot's joint of foot    bilateral   Diabetes mellitus without complication (HCC)    Fibromyalgia    Neuropathy    Past Surgical History:  Procedure Laterality Date   ACHILLES TENDON REPAIR Left    bone spur Left    --left foot   BREAST SURGERY     benign breast bx--left--fatty tissue   CARPAL TUNNEL RELEASE Bilateral    COLONOSCOPY     COLONOSCOPY WITH PROPOFOL  N/A 09/06/2022   Procedure: COLONOSCOPY WITH PROPOFOL ;  Surgeon: Shaaron Lamar HERO, MD;  Location: [facility];  Service: Endoscopy;  Laterality: N/A;  10:30am, asa 2   DILATION AND CURETTAGE OF UTERUS  2011   for cervical polyp   FOOT SURGERY Left 03/2018   reconstructive surgery   POLYPECTOMY  09/06/2022   Procedure: POLYPECTOMY;  Surgeon: Shaaron Lamar HERO, MD;  [facility]: [facility];  Service: Endoscopy;;   Patient Active Problem List   Diagnosis Date Noted   Anxiety 06/28/2022   Arthritis 06/28/2022   Body fluid retention 06/28/2022   Hyperglycemia due to type 2 diabetes mellitus (HCC) 06/28/2022   Mixed hyperlipidemia 06/28/2022   Fibromyalgia 06/28/2022   Cough 10/25/2021   Musculoskeletal pain 05/21/2012   Stiffness of joint, not elsewhere classified, ankle and foot  08/13/2011   Difficulty walking 08/13/2011   Ankle weakness 08/13/2011    ONSET DATE: 08/2023  REFERRING DIAG:  G95.20 (ICD-10-CM) - Unspecified cord compression  R25.2 (ICD-10-CM) - Spastic   THERAPY DIAG:  Other lack of coordination  Other symptoms and signs involving the nervous system  Rationale for Evaluation and Treatment: Rehabilitation  SUBJECTIVE:   SUBJECTIVE STATEMENT: S: I am so numb today.  PERTINENT HISTORY: Pt reported all this started with UTI that caused fracture f neck and indicated as C4-C7 fusion. Pt had multiple bouts of acute and inpatient rehab stays due to complications. Significant BP issues throughout this time. Had stay in multiple facilities. Pt reported various changes in functional status, was requiring assist for ADLs and mobility since April 28th. Since then, pt has been home with Home Health services. Pt continues to report numbness in BUE and poor grip strength. Pt reports neuropathy in BLE. Prior to onset of November '24 was walking daily for 45-60 minutes.   PRECAUTIONS: Fall  WEIGHT BEARING RESTRICTIONS: No  PAIN:  Are you having pain? Yes: NPRS scale: 2/10 Pain location: left upper arm Pain description: sore Aggravating factors: lifting Relieving factors: rest  FALLS: Has patient fallen in last 6 months? No  LIVING ENVIRONMENT: Lives with: lives with  their spouse Lives in: House/apartment Stairs: Pt lives in the basement with no stairs Has following equipment at home: Vannie - 2 wheeled and shower chair  PLOF: Independent  PATIENT GOALS: To improve mobility and strengthening  OBJECTIVE:  Note: Objective measures were completed at Evaluation unless otherwise noted.  HAND DOMINANCE: Right  ADLs: Overall ADLs: Pt has difficulty with all small object manipulation, including but not limited to buttons, shoe laces, and zippers. Additionally she has max difficulty with cooking and cleaning due to weakness in both hands and wrists.    MOBILITY STATUS: Hx of falls, difficulty with turns, and difficulty carrying objections with ambulation  POSTURE COMMENTS:  rounded shoulders Sitting balance: Moves/returns truncal midpoint 1-2 inches in multiple planes  ACTIVITY TOLERANCE: Activity tolerance: Fair tolerance - fatigues quicker than normal  FUNCTIONAL OUTCOME MEASURES: Quick Dash: 47.73  UPPER EXTREMITY ROM:    All ROM is WFL  UPPER EXTREMITY MMT:     MMT Right eval Left eval  Shoulder flexion 5/5 5/5  Shoulder abduction 5/5 5/5  Shoulder internal rotation 5/5 5/5  Shoulder external rotation 5/5 5/5  Elbow flexion 5/5 5/5  Elbow extension 5/5 5/5  Wrist flexion 4/5 4+/5  Wrist extension 4-/5 4/5  Wrist ulnar deviation 4-/5 4/5  Wrist radial deviation 4-/5 4-/5  Wrist pronation 4/5 4+/5  Wrist supination 4/5 4/5  (Blank rows = not tested)  HAND FUNCTION: Grip strength: Right: 20 lbs; Left: 27 lbs, Lateral pinch: Right: 5 lbs, Left: 9 lbs, and 3 point pinch: Right: 5 lbs, Left: 4 lbs  COORDINATION: 06/30/24-9 Hole Peg test: Right: 33.28 sec; Left: 38.26 sec  SENSATION: Pt has full numbness along median nerve of R hand as well as dulled sensation and tingling in BUE up to elbows.   EDEMA: No swelling noted  OBSERVATIONS: adducted thumbs and 80% grip                                                                                                                             TREATMENT DATE:  07/14/24 -theraputty: red, roll into ball, flatten into pancake, roll into log, tripod pinch, lateral pinch, roll into ball, squeeze BUE -pinch strengthening: red, green, and blue resistance clips, RUE up and LUE down -Grooved peg board: 12 pegs RUE and 12 pegs LUE  07/09/24 -Sponges: left hand-23, 23; right-12, 14 -Pinch strengthening: pt using red clothespin and 3 point pinch to grasp and stack 3 towers of 5 sponges with left hand, removing and replacing in bucket with right hand and lateral pinch; unable to  maintain 3 point pinch with right hand 3rd digit -Wrist strengthening: red band-flexion, extension, ulnar/radial deviation, supination/Pronation, 10 reps -Hand gripper, BUE: Right-large and medium beads with gripper horizontal at 25#; Left-large and medium beads gripper horizontal at 25# -Flex bar: yellow-wrist supination, pronation, 10 reps  07/02/24 -Wrist strengthening: red band-flexion, extension, ulnar/radial deviation, supination/Pronation, 10 reps -Hand gripper, BUE: Right-large and medium beads with gripper vertical  at 25#; Left-large and medium beads gripper vertical at 25# -Theraputty: red-flatten, cutting circles with pvc pipe working on grip and wrist stability, pvc pipe pull  06/30/24 -Wrist strengthening: 2#-flexion, extension, ulnar/radial deviation, supination/Pronation, 12 reps -Sponges: left hand-23; right-19 -Hand gripper, BUE: Right-large and medium beads with gripper vertical and horizontal at 25#; Left-large and medium beads gripper horizontal at 25# -Pinch strengthening: pt using red clothespin and 3 point pinch to grasp and stack 4 towers of 5 sponges with left hand, removing and replacing in bucket with right hand and lateral pinch; unable to maintain 3 point pinch with right hand 3rd digit     PATIENT EDUCATION: Education details: theraband wrist strengthening-red Person educated: Patient Education method: Explanation, Demonstration, and Handouts Education comprehension: verbalized understanding and returned demonstration  HOME EXERCISE PROGRAM: 8/1: Wrist ROM and Digit ROM 8/8: Shoulder A/ROM 8/20: Scapular Strengthening 9/4: Coin work 9/18: theraband wrist strengthening-red   GOALS: Goals reviewed with patient? Yes  SHORT TERM GOALS: Target date: 06/19/24  Pt will be provided and educated on a comprehensive HEP for BUE mobility in order to complete ADL's and IADL's independently.   Goal status: IN PROGRESS  2.  Pt will improve BUE strength to 5/5 in  order to lift and carry items during cooking and cleaning tasks.   Goal status: IN PROGRESS  3.  Pt will improve BUE grip by 15# and pinch by 3# in order to grasp and hold pots and pans.  Goal status: IN PROGRESS  4.  Pt will improve BUE coordination by completing 9 hole peg test in 45 or less in order to manipulate and complete fine motor tasks.   Goal status: MET   ASSESSMENT:  CLINICAL IMPRESSION:  This session pt having increased numbness from previous session. She had increased difficulty with finger placement during theraputty, with fingers bending under and her not realizing. Her LUE appeared more incoordinated this session with difficulty completing a lateral pinch or placing grooved pegs in the peg board. OT providing verbal and visual cuing for positioning and technique throughout session.   PERFORMANCE DEFICITS: in functional skills including ADLs, IADLs, coordination, dexterity, sensation, ROM, strength, pain, fascial restrictions, Fine motor control, Gross motor control, body mechanics, and UE functional use.    PLAN:  OT FREQUENCY: 2x/week  OT DURATION: 4 weeks  PLANNED INTERVENTIONS: 97168 OT Re-evaluation, 97535 self care/ADL training, 02889 therapeutic exercise, 97530 therapeutic activity, 97112 neuromuscular re-education, 97140 manual therapy, 97035 ultrasound, 97018 paraffin, 02989 moist heat, 97032 electrical stimulation (manual), passive range of motion, functional mobility training, energy conservation, coping strategies training, patient/family education, and DME and/or AE instructions  CONSULTED AND AGREED WITH PLAN OF CARE: Patient  PLAN FOR NEXT SESSION: Wrist and Digit ROM, strengthening, coordination tasks   Valentin Nightingale, OTR/L [phone] 07/14/2024, 2:51 PM

## 2024-07-16 ENCOUNTER — Ambulatory Visit (HOSPITAL_COMMUNITY)

## 2024-07-16 ENCOUNTER — Ambulatory Visit (HOSPITAL_COMMUNITY): Admitting: Occupational Therapy

## 2024-07-16 ENCOUNTER — Encounter (HOSPITAL_COMMUNITY): Payer: Self-pay | Admitting: Occupational Therapy

## 2024-07-16 ENCOUNTER — Encounter (HOSPITAL_COMMUNITY): Payer: Self-pay

## 2024-07-16 DIAGNOSIS — M6281 Muscle weakness (generalized): Secondary | ICD-10-CM

## 2024-07-16 DIAGNOSIS — R278 Other lack of coordination: Secondary | ICD-10-CM | POA: Diagnosis not present

## 2024-07-16 DIAGNOSIS — R29818 Other symptoms and signs involving the nervous system: Secondary | ICD-10-CM

## 2024-07-16 DIAGNOSIS — G9520 Unspecified cord compression: Secondary | ICD-10-CM

## 2024-07-16 DIAGNOSIS — Z7409 Other reduced mobility: Secondary | ICD-10-CM

## 2024-07-16 NOTE — Therapy (Signed)
 OUTPATIENT OCCUPATIONAL THERAPY NEURO TREATMENT NOTE REASSESSMENT/RECERTIFICATION  Patient Name: Angela Norman MRN: [id number] DOB:1956/03/11, 68 y.o., female Today's Date: 07/16/2024    Progress Note Reporting Period 06/16/24 to 07/16/24  See note below for Objective Data and Assessment of Progress/Goals.    PCP: Shona Norleen PEDLAR, MD REFERRING PROVIDER: Hillman Amour, MD  END OF SESSION:  OT End of Session - 07/16/24 1526     Visit Number 16    Number of Visits 24    Date for Recertification  08/14/24    Authorization Type Medicare Part A and B    Progress Note Due on Visit 19    OT Start Time 1355    OT Stop Time 1438    OT Time Calculation (min) 43 min    Activity Tolerance Patient tolerated treatment well    Behavior During Therapy WFL for tasks assessed/performed          Past Medical History:  Diagnosis Date   Anxiety    Arthritis    Basal cell carcinoma    Charcot's joint of foot    bilateral   Diabetes mellitus without complication (HCC)    Fibromyalgia    Neuropathy    Past Surgical History:  Procedure Laterality Date   ACHILLES TENDON REPAIR Left    bone spur Left    --left foot   BREAST SURGERY     benign breast bx--left--fatty tissue   CARPAL TUNNEL RELEASE Bilateral    COLONOSCOPY     COLONOSCOPY WITH PROPOFOL  N/A 09/06/2022   Procedure: COLONOSCOPY WITH PROPOFOL ;  Surgeon: Shaaron Lamar HERO, MD;  [facility]: [facility];  Service: Endoscopy;  Laterality: N/A;  10:30am, asa 2   DILATION AND CURETTAGE OF UTERUS  2011   for cervical polyp   FOOT SURGERY Left 03/2018   reconstructive surgery   POLYPECTOMY  09/06/2022   Procedure: POLYPECTOMY;  Surgeon: Shaaron Lamar HERO, MD;  Location: [facility];  Service: Endoscopy;;   Patient Active Problem List   Diagnosis Date Noted   Anxiety 06/28/2022   Arthritis 06/28/2022   Body fluid retention 06/28/2022   Hyperglycemia due to type 2 diabetes mellitus (HCC) 06/28/2022   Mixed  hyperlipidemia 06/28/2022   Fibromyalgia 06/28/2022   Cough 10/25/2021   Musculoskeletal pain 05/21/2012   Stiffness of joint, not elsewhere classified, ankle and foot 08/13/2011   Difficulty walking 08/13/2011   Ankle weakness 08/13/2011    ONSET DATE: 08/2023  REFERRING DIAG:  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 Pt had multiple bouts of acute and inpatient rehab stays due to complications. Significant BP issues throughout this time. Had stay in multiple facilities. Pt reported various changes in functional status, was requiring assist for ADLs and mobility since April 28th. Since then, pt has been home with Home Health services. Pt continues to report numbness in BUE and poor grip strength. Pt reports neuropathy in BLE. Prior to onset of November '24 was walking daily for 45-60 minutes.   PRECAUTIONS: Fall  WEIGHT BEARING RESTRICTIONS: No  PAIN:  Are you having pain? No  FALLS: Has patient fallen in last  6 months? No  LIVING ENVIRONMENT: Lives with: lives with their spouse Lives in: House/apartment Stairs: Pt lives in the basement with no stairs Has following equipment at home: Vannie - 2 wheeled and shower chair  PLOF: Independent  PATIENT GOALS: To improve mobility and strengthening  OBJECTIVE:  Note: Objective measures were completed at Evaluation unless otherwise noted.  HAND DOMINANCE: Right  ADLs: Overall ADLs: Pt has difficulty with all small object manipulation, including but not limited to buttons, shoe laces, and zippers. Additionally she has max difficulty  with cooking and cleaning due to weakness in both hands and wrists.   MOBILITY STATUS: Hx of falls, difficulty with turns, and difficulty carrying objections with ambulation  POSTURE COMMENTS:  rounded shoulders Sitting balance: Moves/returns truncal midpoint 1-2 inches in multiple planes  ACTIVITY TOLERANCE: Activity tolerance: Fair tolerance - fatigues quicker than normal  FUNCTIONAL OUTCOME MEASURES: Quick Dash: 47.73 07/16/24: 20.45  UPPER EXTREMITY ROM:    All ROM is [facility]  UPPER EXTREMITY MMT:     MMT Right eval Left eval Right 07/16/24 Left 07/16/24  Shoulder flexion 5/5 5/5 5/5 5/5  Shoulder abduction 5/5 5/5 5/5 4/5  Shoulder internal rotation 5/5 5/5 5/5 5/5  Shoulder external rotation 5/5 5/5 4+/5 4+/5  Elbow flexion 5/5 5/5 5/5 5/5  Elbow extension 5/5 5/5 4+/5 4+/5  Wrist flexion 4/5 4+/5 4+/5 5/5  Wrist extension 4-/5 4/5 5/5 5/5  Wrist ulnar deviation 4-/5 4/5 4/5 4+/5  Wrist radial deviation 4-/5 4-/5 5/5 5/5  Wrist pronation 4/5 4+/5 5/5 5/5  Wrist supination 4/5 4/5 4+/5 4+/5  (Blank rows = not tested)  HAND FUNCTION: Grip strength: Right: 20 lbs; Left: 27 lbs, Lateral pinch: Right: 5 lbs, Left: 9 lbs, and 3 point pinch: Right: 5 lbs, Left: 4 lbs 07/16/24: Grip strength: Right: 31 lbs; Left: 35 lbs, Lateral pinch: Right: 7 lbs, Left: 8 lbs, and 3 point pinch: Right: 7 lbs, Left: 9 lbs  COORDINATION: 06/30/24-9 Hole Peg test: Right: 33.28 sec; Left: 38.26 sec 07/16/24- 9 Hole Peg test: Right: 52.15 sec; Left: 49.88 sec  SENSATION: Pt has full numbness along median nerve of R hand as well as dulled sensation and tingling in BUE up to elbows.   EDEMA: No swelling noted  OBSERVATIONS: adducted thumbs and 80% grip                                                                                                                             TREATMENT DATE:  07/16/24 - 9 hole peg test x5 working on hand placement - A/ROM: shoulder flexion, abduction, er/IR,  elbow flexion/extension, x10 -Wrist ROM: flexion, extension, ulnar/radial deviation, supination/pronation, x10 -Threading beads on a string x10 small holed beaded letters -Reassessment  07/14/24 -theraputty: red, roll into ball, flatten into pancake, roll into log, tripod pinch, lateral pinch, roll into ball, squeeze BUE -pinch strengthening: red, green, and blue resistance clips, RUE up and LUE down -Grooved peg board:  12 pegs RUE and 12 pegs LUE  07/09/24 -Sponges: left hand-23, 23; right-12, 14 -Pinch strengthening: pt using red clothespin and 3 point pinch to grasp and stack 3 towers of 5 sponges with left hand, removing and replacing in bucket with right hand and lateral pinch; unable to maintain 3 point pinch with right hand 3rd digit -Wrist strengthening: red band-flexion, extension, ulnar/radial deviation, supination/Pronation, 10 reps -Hand gripper, BUE: Right-large and medium beads with gripper horizontal at 25#; Left-large and medium beads gripper horizontal at 25# -Flex bar: yellow-wrist supination, pronation, 10 reps   PATIENT EDUCATION: Education details: Continue HEP Person educated: Patient Education method: Programmer, multimedia, Demonstration, and Handouts Education comprehension: verbalized understanding and returned demonstration  HOME EXERCISE PROGRAM: 8/1: Wrist ROM and Digit ROM 8/8: Shoulder A/ROM 8/20: Scapular Strengthening 9/4: Coin work 9/18: theraband wrist strengthening-red   GOALS: Goals reviewed with patient? Yes  SHORT TERM GOALS: Target date: 06/19/24  Pt will be provided and educated on a comprehensive HEP for BUE mobility in order to complete ADL's and IADL's independently.   Goal status: IN PROGRESS  2.  Pt will improve BUE strength to 5/5 in order to lift and carry items during cooking and cleaning tasks.   Goal status: IN PROGRESS  3.  Pt will improve BUE grip by 15# and pinch by 3# in order to grasp and hold pots and pans.  Goal status: IN  PROGRESS  4.  Pt will improve BUE coordination by completing 9 hole peg test in 45 or less in order to manipulate and complete fine motor tasks.   Goal status: MET   ASSESSMENT:  CLINICAL IMPRESSION:  Pt completed reassessment for recertification this session. She is demonstrating good improvements with strength and gripping/pinching. Pt continuing to work on her coordination, as she had increased difficulty this session. Pt did report that her numbness is improved the session. OT providing verbal and tactile cuing for positioning and technique throughout session. Recommending continued OT for 2x/week for 4 more weeks.    PERFORMANCE DEFICITS: in functional skills including ADLs, IADLs, coordination, dexterity, sensation, ROM, strength, pain, fascial restrictions, Fine motor control, Gross motor control, body mechanics, and UE functional use.    PLAN:  OT FREQUENCY: 2x/week  OT DURATION: 4 weeks  PLANNED INTERVENTIONS: 97168 OT Re-evaluation, 97535 self care/ADL training, 02889 therapeutic exercise, 97530 therapeutic activity, 97112 neuromuscular re-education, 97140 manual therapy, 97035 ultrasound, 97018 paraffin, 02989 moist heat, 97032 electrical stimulation (manual), passive range of motion, functional mobility training, energy conservation, coping strategies training, patient/family education, and DME and/or AE instructions  CONSULTED AND AGREED WITH PLAN OF CARE: Patient  PLAN FOR NEXT SESSION: Wrist and Digit ROM, strengthening, coordination tasks   Valentin Nightingale, OTR/L [phone] 07/16/2024, 3:28 PM

## 2024-07-16 NOTE — Therapy (Signed)
 OUTPATIENT PHYSICAL THERAPY NEURO TREATMENT   Patient Name: Angela Norman MRN: [id number] DOB:Jun 21, 1956, 68 y.o., female Today's Date: 07/16/2024   PCP: Sebastian Othel GAILS, FNP REFERRING PROVIDER: Edna Norris, MD  END OF SESSION:  PT End of Session - 07/16/24 1454     Visit Number 17    Number of Visits 24    Date for Recertification  07/28/24    Authorization Type Medicare A & B    Progress Note Due on Visit 21   PN complete visit #11   PT Start Time 1500    PT Stop Time 1540    PT Time Calculation (min) 40 min    Equipment Utilized During Treatment Gait belt    Activity Tolerance Patient tolerated treatment well    Behavior During Therapy WFL for tasks assessed/performed               Past Medical History:  Diagnosis Date   Anxiety    Arthritis    Basal cell carcinoma    Charcot's joint of foot    bilateral   Diabetes mellitus without complication (HCC)    Fibromyalgia    Neuropathy    Past Surgical History:  Procedure Laterality Date   ACHILLES TENDON REPAIR Left    bone spur Left    --left foot   BREAST SURGERY     benign breast bx--left--fatty tissue   CARPAL TUNNEL RELEASE Bilateral    COLONOSCOPY     COLONOSCOPY WITH PROPOFOL  N/A 09/06/2022   Procedure: COLONOSCOPY WITH PROPOFOL ;  Surgeon: Shaaron Lamar HERO, MD;  [facility]: [facility];  Service: Endoscopy;  Laterality: N/A;  10:30am, asa 2   DILATION AND CURETTAGE OF UTERUS  2011   for cervical polyp   FOOT SURGERY Left 03/2018   reconstructive surgery   POLYPECTOMY  09/06/2022   Procedure: POLYPECTOMY;  Surgeon: Shaaron Lamar HERO, MD;  [facility];  Service: Endoscopy;;   Patient Active Problem List   Diagnosis Date Noted   Anxiety 06/28/2022   Arthritis 06/28/2022   Body fluid retention 06/28/2022   Hyperglycemia due to type 2 diabetes mellitus (HCC) 06/28/2022   Mixed hyperlipidemia 06/28/2022   Fibromyalgia 06/28/2022   Cough 10/25/2021   Musculoskeletal  pain 05/21/2012   Stiffness of joint, not elsewhere classified, ankle and foot 08/13/2011   Difficulty walking 08/13/2011   Ankle weakness 08/13/2011    ONSET DATE: November 2024  REFERRING DIAG: compression of spinal cord   THERAPY DIAG:  Compression of spinal cord (HCC)  Impaired functional mobility, balance, gait, and endurance  Muscle weakness (generalized)  Rationale for Evaluation and Treatment: Rehabilitation  SUBJECTIVE:  SUBJECTIVE STATEMENT: 07/16/24:  Reports she had sciatic like symptoms Rt LE down to knee yesterday, hasn't felt that in a while.  No pain currently, just some tightness.  Feels wobbly when brushing teeth with low counter.  Eval:  Pt reported all this started with UTI that caused fracture of neck and indicated as C4-C7 fusion. Pt had multiple bouts of acute and inpatient rehab stays due to complications. Significant BP issues throughout this time. Had stay in multiple facilities. Pt reported various changes in functional status, was requiring assist for ADLs and mobility since April 28th. Since then, pt has been home with Home Health services. Pt continues to report numbness in BUE and poor grip strength. Pt reports neuropathy in BLE. Prior to onset of November '24 was walking daily for 45-60 minutes.   Pt has CT scan for nodule on Lung on 04/28/24.  Pt accompanied by: self  PERTINENT HISTORY:  Cervical Myelopathy with ACDF 4-7 in November 2024\ Left ankle surgery x 3  PAIN:  Are you having pain? No and chronic pain in left shoulder  PRECAUTIONS: None  RED FLAGS: Bowel or bladder incontinence: Yes: SCI   WEIGHT BEARING RESTRICTIONS: No  FALLS: Has patient fallen in last 6 months? Yes. Number of falls 1-2   PATIENT GOALS:  want to walk with AD well and with  confidence and strengthening RUE.  OBJECTIVE:  Note: Objective measures were completed at Evaluation unless otherwise noted.  DIAGNOSTIC FINDINGS:   COGNITION: Overall cognitive status: Within functional limits for tasks assessed   SENSATION: Light touch: decreased light touch from lateral malleolus distally.   LOWER EXTREMITY ROM:     Active  Right Eval Left Eval  Hip flexion    Hip extension    Hip abduction    Hip adduction    Hip internal rotation    Hip external rotation    Knee flexion    Knee extension    Ankle dorsiflexion    Ankle plantarflexion    Ankle inversion    Ankle eversion     (Blank rows = not tested)  LOWER EXTREMITY MMT:    MMT Right Eval Left Eval Right 06/25/24 Left 06/25/24  Hip flexion      Hip extension      Hip abduction      Hip adduction      Hip internal rotation      Hip external rotation      Knee flexion      Knee extension 4- 4+ 4+ 5  Ankle dorsiflexion      Ankle plantarflexion      Ankle inversion      Ankle eversion      (Blank rows = not tested)   TRANSFERS: Sit to stand: Modified independence  Assistive device utilized: Environmental consultant - 2 wheeled     Stand to sit: Modified independence  Assistive device utilized: Environmental consultant - 2 wheeled     Chair to chair: SBA  Assistive device utilized: Environmental consultant - 2 wheeled       GAIT: Findings: Gait Characteristics: step through pattern, decreased arm swing- Right, decreased arm swing- Left, decreased step length- Right, decreased step length- Left, decreased hip/knee flexion- Right, decreased hip/knee flexion- Left, Right foot flat, Left foot flat, and narrow BOS, Distance walked: 36ft, Assistive device utilized:Walker - 2 wheeled, Level of assistance: Modified independence, and Comments:    FUNCTIONAL TESTS:  TUG: 25.71 seconds  DGI: 05/13/24: 8 / 24  L SLS: <0.1 seconds  R  SLS: <0.1 seconds  [place]orms: 18-39  F: 43.5 seconds  M: 43.2 seconds 40-49  F: 40.4 seconds  M: 40.1  seconds 50-59  F: 36 seconds  M: 38.1 seconds 60-69  F: 25.1 seconds  M: 28.7 seconds 70-79  F: 11.3 seconds  M: 18.3 seconds  PATIENT SURVEYS:  ABC Scale:950 / 1600 = 59.4 %                                                                                                                              TREATMENT DATE:  07/16/24: - Nustep seat 8 x 5' level 7, pt avgs 70 SPM; [place] UE/LE - Cone placement in cabinet 1st lower shelf then the 2nd - Cone reaching forward - Dead lift while pulling cones towards her - Toe tapping 7in alteranating 20x 1 HHA - Lateral foot tapping between 6 and 8in step (required Rt UE support while using Rt LE) 2 HHA - Cone rotation standing NBOS on foam - 6in step up and over 1-2 UE support required 10x   Seated:  - Piriformis stretch 2x 30 07/14/24: - Nustep seat 8 x 5' level 7, pt avgs 70 SPM; [place] UE/LE - Foam step up and down with 1 HHA - Standing on faom no HHA, able to stand for a minutes without - STS holding Tidal tank 15x - Sidestep holding Tidal tank 2RT inside // bars with min A - Step up and over with 2 UE support 6in step height - Standing no HHA with head turns 2x 10 - Standing with UE flexion alternating 20x - Toe tapping 6in alteranating 20x 1 HHA - Lateral foot tapping between 6 and 8in step (required Rt UE support while using Rt LE) 2 HHA  07/09/24: -Nustep seat 8 x 5' level 7, pt avgs 70 SPM; [place] UE/LE - STS (no HHA) to heel raise (with HHA) 15x - Step up and overs, with 2 UE support, 6 inch step, 1 set of 3 reps (increased difficulty with 1 UE support) - Lateral step up 6in 2 UE support 15x - Tandem stance with one foot on 6in step/other on floor 2x 30 then 5 head turns each foot on step - 12in hurdles forward and sidestep over 2RT with BUE support   07/07/24:   - Nustep UE/LE x 5' L6 [place] average SPM 70 - 12in hurdles forward and sidestep over 2RT with BUE support (attempted 1 but increased  instability) - Step up and overs, with only 1 UE support, 6 inch step, 1 set of 3 reps - 5 Sit to stand no UE support 14.87 - Squats front of chair 10x3 - Heel raise with UE support for improved ROM 2x 10 on incline slope - Tandem stance with one foot on 6in step/other on floor intermittent HHA required 2x 30    07/02/2024  Therapeutic Exercise: -Nustep seat 8 x 5' level 6, pt avgs 70 SPM -Leg press 3 sets of  10 reps plate 3>4, pt cued for LE alignment -Heel raises, 2 sets of 10 reps, pt cued for UE support for increased ROM  Therapeutic Activity: -Sit to stands from standard chair - 6 reps pt cued for core activation -Sled push, 20lbs, 2 laps, 50 foot laps, pt cued for consistent movement of sled and educated on functionality -Speed step ups, 2 bouts of 30 second trials (8.5 first set, 9.5 second set), pt cued for sequencing and increased speed -Step up and overs, with only 1 UE support, 6 inch step, 1 set of 3 reps    06/25/24 Progress note ABC 58.8% MMT's knee extension left 5; right 4+ TUG 21.38 sec with RW SLS left 2 and right 1  DGI 1. Gait level surface (2) Mild Impairment: Walks 20', uses assistive devices, slower speed, mild gait deviations. 2. Change in gait speed (2) Mild Impairment: Is able to change speed but demonstrates mild gait deviations, or not gait deviations but unable to achieve a significant change in velocity, or uses an assistive device. 3. Gait with horizontal head turns (2) Mild Impairment: Performs head turns smoothly with slight change in gait velocity, i.e., minor disruption to smooth gait path or uses walking aid. 4. Gait with vertical head turns (2) Mild Impairment: Performs head turns smoothly with slight change in gait velocity, i.e., minor disruption to smooth gait path or uses walking aid. 5. Gait and pivot turn (2) Mild Impairment: Pivot turns safely in > 3 seconds and stops with no loss of balance. 6. Step over obstacle (1) Moderate  Impairment: Is able to step over box but must stop, then step over. May require verbal cueing. 7. Step around obstacles (2) Mild Impairment: Is able to step around both cones, but must slow down and adjust steps to clear cones. 8. Stairs (1) Moderate Impairment: Two feet to a stair, must use rail.  TOTAL SCORE: 14 / 24   PATIENT EDUCATION: Education details: PT Evaluation, findings, prognosis, frequency, attendance policy. Person educated: Patient Education method: Explanation and Demonstration Education comprehension: verbalized understanding  HOME EXERCISE PROGRAM: Access Code: [email or web address] URL: [email or web address] Date: 05/14/2024 Prepared by: Augustin Mclean  Exercises - Sit to Stand  - 1 x daily - 7 x weekly - 3 sets - 10 reps - Standing March with Counter Support  - 1 x daily - 7 x weekly - 3 sets - 10 reps - Standing Hip Abduction with Counter Support  - 1 x daily - 7 x weekly - 3 sets - 10 reps - Side Stepping with Resistance at Thighs and Counter Support  - 1 x daily - 7 x weekly - 3 sets - 10 reps  06/30/24: - Squat with Chair and Counter Support  - 2 x daily - 7 x weekly - 1 sets - 10 reps - Standing Tandem Balance with Counter Support  - 1 x daily - 7 x weekly - 1 sets - 3 reps - 30 hold  GOALS: Goals reviewed with patient? No  SHORT TERM GOALS: Target date: 05/25/2024  Pt will be independent with HEP in order to demonstrate participation in Physical Therapy POC.  Baseline: Goal status: MET  2.  Pt will improve ABC score by 15% in order to demonstrate improved pain with functional goals and outcomes. Baseline:  Goal status: IN PROGRESS  LONG TERM GOALS: Target date: 06/22/2024  Pt will increase DGI score by > MCID in order to demonstrate improved functional safety and balance skills in ADL/mobility.   Baseline:  see objective. 06/25/24  14/24 Goal status: IN PROGRESS  2.  Pt will improve TUG by least 3 seconds in order to demonstrate improved  functional mobility capacity in community setting.  Baseline: see objective. 06/25/24 21.38 sec with RW Goal status: MET  3.  Pt will improve ABC score by 30% in order to demonstrate improved pain with functional goals and outcomes. Baseline: see objective.  06/25/24 58.8% Goal status: IN PROGRESS  4.  Pt will improve single leg balance by at least 3 seconds bilaterally in order to demonstrate improved capacity, safety, and overall quality of movement to optimize function.  Baseline: see objective. 06/25/24 2 on left and 1 on right Goal status: IN PROGRESS  ASSESSMENT:  CLINICAL IMPRESSION: Continued session focus with LE strengthening and balance training.  Pt reports some instability while standing in bathroom reaching forward, added dead lifting for posterior chain strengthening and worked on reaching activities.  Pt able to stand without HHA putting cones onto shelf above with no LOB.  Increased assistance required with forward reaching activities.  No reports of pain through session and activity tolerance is improving with no required rest breaks through session.    Eval:  Patient is a 68y.o. female who was seen today for physical therapy evaluation and treatment for 'compression of spinal cord.'  Patient with history of myelopathy C5 spinal fracture, with A C4 through C7 cervical fusion.  Patient presents with significant functional mobility deficits, safety concerns, increased falls risk, reduced ambulation capacity at due to muscle weakness, decreased activity tolerance, balance deficits in setting of previous cervical myelopathy pt will benefit from skilled Physical Therapy services to address deficits/limitations in order to improve functional and QOL.    OBJECTIVE IMPAIRMENTS: Abnormal gait, decreased activity tolerance, decreased balance, decreased mobility, difficulty walking, decreased strength, and postural dysfunction.   ACTIVITY LIMITATIONS: carrying, lifting, bending, standing,  squatting, stairs, transfers, bed mobility, and locomotion level  PARTICIPATION LIMITATIONS: meal prep, cleaning, laundry, community activity, occupation, and yard work  PERSONAL FACTORS: Age are also affecting patient's functional outcome.   REHAB POTENTIAL: Excellent  CLINICAL DECISION MAKING: Stable/uncomplicated  EVALUATION COMPLEXITY: Low  PLAN:  PT FREQUENCY: 2x/week  PT DURATION: 4 weeks  PLANNED INTERVENTIONS: 97164- PT Re-evaluation, 97750- Physical Performance Testing, 97110-Therapeutic exercises, 97530- Therapeutic activity, 97112- Neuromuscular re-education, 97535- Self Care, 02859- Manual therapy, [phone]- Gait training, [phone]- Orthotic Initial, Patient/Family education, Balance training, Stair training, Taping, Joint mobilization, Joint manipulation, Spinal manipulation, Spinal mobilization, Cryotherapy, and Moist heat  PLAN FOR NEXT SESSION: Functional mobility, strengthening, ambulation, etc.   Augustin Mclean, LPTA/CLT; [facility] [phone]  4:49 PM, 07/16/24

## 2024-07-21 ENCOUNTER — Ambulatory Visit (HOSPITAL_COMMUNITY)

## 2024-07-21 ENCOUNTER — Encounter (HOSPITAL_COMMUNITY): Payer: Self-pay | Admitting: Occupational Therapy

## 2024-07-21 ENCOUNTER — Ambulatory Visit (HOSPITAL_COMMUNITY): Admitting: Occupational Therapy

## 2024-07-21 ENCOUNTER — Encounter (HOSPITAL_COMMUNITY): Payer: Self-pay

## 2024-07-21 DIAGNOSIS — R278 Other lack of coordination: Secondary | ICD-10-CM

## 2024-07-21 DIAGNOSIS — G9520 Unspecified cord compression: Secondary | ICD-10-CM

## 2024-07-21 DIAGNOSIS — R29818 Other symptoms and signs involving the nervous system: Secondary | ICD-10-CM

## 2024-07-21 DIAGNOSIS — Z7409 Other reduced mobility: Secondary | ICD-10-CM

## 2024-07-21 DIAGNOSIS — M6281 Muscle weakness (generalized): Secondary | ICD-10-CM

## 2024-07-21 NOTE — Therapy (Unsigned)
 OUTPATIENT OCCUPATIONAL THERAPY NEURO TREATMENT NOTE REASSESSMENT/RECERTIFICATION  Patient Name: Angela Norman MRN: [id number] DOB:09/25/1956, 68 y.o., female Today's Date: 07/21/2024    Progress Note Reporting Period 06/16/24 to 07/16/24  See note below for Objective Data and Assessment of Progress/Goals.    PCP: Shona Norleen PEDLAR, MD REFERRING PROVIDER: Hillman Amour, MD  END OF SESSION:    Past Medical History:  Diagnosis Date   Anxiety    Arthritis    Basal cell carcinoma    Charcot's joint of foot    bilateral   Diabetes mellitus without complication (HCC)    Fibromyalgia    Neuropathy    Past Surgical History:  Procedure Laterality Date   ACHILLES TENDON REPAIR Left    bone spur Left    --left foot   BREAST SURGERY     benign breast bx--left--fatty tissue   CARPAL TUNNEL RELEASE Bilateral    COLONOSCOPY     COLONOSCOPY WITH PROPOFOL  N/A 09/06/2022   Procedure: COLONOSCOPY WITH PROPOFOL ;  Surgeon: Shaaron Lamar HERO, MD;  [facility]: [facility];  Service: Endoscopy;  Laterality: N/A;  10:30am, asa 2   DILATION AND CURETTAGE OF UTERUS  2011   for cervical polyp   FOOT SURGERY Left 03/2018   reconstructive surgery   POLYPECTOMY  09/06/2022   Procedure: POLYPECTOMY;  Surgeon: Shaaron Lamar HERO, MD;  Location: [facility];  Service: Endoscopy;;   Patient Active Problem List   Diagnosis Date Noted   Anxiety 06/28/2022   Arthritis 06/28/2022   Body fluid retention 06/28/2022   Hyperglycemia due to type 2 diabetes mellitus (HCC) 06/28/2022   Mixed hyperlipidemia 06/28/2022   Fibromyalgia 06/28/2022   Cough 10/25/2021   Musculoskeletal pain 05/21/2012   Stiffness of joint, not elsewhere classified, ankle and foot 08/13/2011   Difficulty walking 08/13/2011   Ankle weakness 08/13/2011    ONSET DATE: 08/2023  REFERRING DIAG:  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  Rationale for Evaluation and Treatment: Rehabilitation  SUBJECTIVE:   SUBJECTIVE STATEMENT: S: I took a shower by myself for the first time friday  PERTINENT HISTORY: Pt reported all this started with UTI that caused fracture f neck and indicated as C4-C7 fusion. Pt had multiple bouts of acute and inpatient rehab stays due to complications. Significant BP issues throughout this time. Had stay in multiple facilities. Pt reported various changes in functional status, was requiring assist for ADLs and mobility since April 28th. Since then, pt has been home with Home Health services. Pt continues to report numbness in BUE and poor grip strength. Pt reports neuropathy in BLE. Prior to onset of November '24 was walking daily for 45-60 minutes.   PRECAUTIONS: Fall  WEIGHT BEARING RESTRICTIONS: No  PAIN:  Are you having pain? No  FALLS: Has patient fallen in last 6 months? No  LIVING ENVIRONMENT: Lives with: lives with their spouse Lives in: House/apartment Stairs: Pt lives in the basement with no stairs Has following equipment at home: Vannie - 2 wheeled and shower chair  PLOF: Independent  PATIENT GOALS: To improve mobility and strengthening  OBJECTIVE:  Note: Objective measures were completed at Evaluation unless otherwise noted.  HAND DOMINANCE: Right  ADLs: Overall ADLs: Pt has difficulty with all small object manipulation, including but not limited to buttons, shoe laces, and zippers. Additionally she has max difficulty with cooking and cleaning due to weakness in both hands and wrists.   MOBILITY  STATUS: Hx of falls, difficulty with turns, and difficulty carrying objections with ambulation  POSTURE COMMENTS:  rounded shoulders Sitting balance: Moves/returns truncal midpoint 1-2 inches in multiple planes  ACTIVITY TOLERANCE: Activity tolerance: Fair tolerance - fatigues quicker than normal  FUNCTIONAL OUTCOME MEASURES: Quick Dash: 47.73 07/16/24: 20.45  UPPER  EXTREMITY ROM:    All ROM is [facility]  UPPER EXTREMITY MMT:     MMT Right eval Left eval Right 07/16/24 Left 07/16/24  Shoulder flexion 5/5 5/5 5/5 5/5  Shoulder abduction 5/5 5/5 5/5 4/5  Shoulder internal rotation 5/5 5/5 5/5 5/5  Shoulder external rotation 5/5 5/5 4+/5 4+/5  Elbow flexion 5/5 5/5 5/5 5/5  Elbow extension 5/5 5/5 4+/5 4+/5  Wrist flexion 4/5 4+/5 4+/5 5/5  Wrist extension 4-/5 4/5 5/5 5/5  Wrist ulnar deviation 4-/5 4/5 4/5 4+/5  Wrist radial deviation 4-/5 4-/5 5/5 5/5  Wrist pronation 4/5 4+/5 5/5 5/5  Wrist supination 4/5 4/5 4+/5 4+/5  (Blank rows = not tested)  HAND FUNCTION: Grip strength: Right: 20 lbs; Left: 27 lbs, Lateral pinch: Right: 5 lbs, Left: 9 lbs, and 3 point pinch: Right: 5 lbs, Left: 4 lbs 07/16/24: Grip strength: Right: 31 lbs; Left: 35 lbs, Lateral pinch: Right: 7 lbs, Left: 8 lbs, and 3 point pinch: Right: 7 lbs, Left: 9 lbs  COORDINATION: 06/30/24-9 Hole Peg test: Right: 33.28 sec; Left: 38.26 sec 07/16/24- 9 Hole Peg test: Right: 52.15 sec; Left: 49.88 sec  SENSATION: Pt has full numbness along median nerve of R hand as well as dulled sensation and tingling in BUE up to elbows.   EDEMA: No swelling noted  OBSERVATIONS: adducted thumbs and 80% grip                                                                                                                             TREATMENT DATE:  07/21/24 -gripper: 22# and 25# BUE large and medium beads -Pinch Tree: 1# wrist weights -Flex bar: teal - wrist flexion/extension, supination/pronation, ulnar/radial deviation, x10 -coins:  07/16/24 - 9 hole peg test x5 working on hand placement - A/ROM: shoulder flexion, abduction, er/IR, elbow flexion/extension, x10 -Wrist ROM: flexion, extension, ulnar/radial deviation, supination/pronation, x10 -Threading beads on a string x10 small holed beaded letters -Reassessment  07/14/24 -theraputty: red, roll into ball, flatten into pancake, roll into  log, tripod pinch, lateral pinch, roll into ball, squeeze BUE -pinch strengthening: red, green, and blue resistance clips, RUE up and LUE down -Grooved peg board: 12 pegs RUE and 12 pegs LUE  07/09/24 -Sponges: left hand-23, 23; right-12, 14 -Pinch strengthening: pt using red clothespin and 3 point pinch to grasp and stack 3 towers of 5 sponges with left hand, removing and replacing in bucket with right hand and lateral pinch; unable to maintain 3 point pinch with right hand 3rd digit -Wrist strengthening: red band-flexion, extension, ulnar/radial deviation, supination/Pronation, 10 reps -Hand gripper, BUE: Right-large and medium beads with gripper horizontal  at 25#; Left-large and medium beads gripper horizontal at 25# -Flex bar: yellow-wrist supination, pronation, 10 reps   PATIENT EDUCATION: Education details: Continue HEP Person educated: Patient Education method: Programmer, multimedia, Demonstration, and Handouts Education comprehension: verbalized understanding and returned demonstration  HOME EXERCISE PROGRAM: 8/1: Wrist ROM and Digit ROM 8/8: Shoulder A/ROM 8/20: Scapular Strengthening 9/4: Coin work 9/18: theraband wrist strengthening-red   GOALS: Goals reviewed with patient? Yes  SHORT TERM GOALS: Target date: 06/19/24  Pt will be provided and educated on a comprehensive HEP for BUE mobility in order to complete ADL's and IADL's independently.   Goal status: IN PROGRESS  2.  Pt will improve BUE strength to 5/5 in order to lift and carry items during cooking and cleaning tasks.   Goal status: IN PROGRESS  3.  Pt will improve BUE grip by 15# and pinch by 3# in order to grasp and hold pots and pans.  Goal status: IN PROGRESS  4.  Pt will improve BUE coordination by completing 9 hole peg test in 45 or less in order to manipulate and complete fine motor tasks.   Goal status: MET   ASSESSMENT:  CLINICAL IMPRESSION:  Pt completed reassessment for recertification this  session. She is demonstrating good improvements with strength and gripping/pinching. Pt continuing to work on her coordination, as she had increased difficulty this session. Pt did report that her numbness is improved the session. OT providing verbal and tactile cuing for positioning and technique throughout session. Recommending continued OT for 2x/week for 4 more weeks.    PERFORMANCE DEFICITS: in functional skills including ADLs, IADLs, coordination, dexterity, sensation, ROM, strength, pain, fascial restrictions, Fine motor control, Gross motor control, body mechanics, and UE functional use.    PLAN:  OT FREQUENCY: 2x/week  OT DURATION: 4 weeks  PLANNED INTERVENTIONS: 97168 OT Re-evaluation, 97535 self care/ADL training, 02889 therapeutic exercise, 97530 therapeutic activity, 97112 neuromuscular re-education, 97140 manual therapy, 97035 ultrasound, 97018 paraffin, 02989 moist heat, 97032 electrical stimulation (manual), passive range of motion, functional mobility training, energy conservation, coping strategies training, patient/family education, and DME and/or AE instructions  CONSULTED AND AGREED WITH PLAN OF CARE: Patient  PLAN FOR NEXT SESSION: Wrist and Digit ROM, strengthening, coordination tasks   Valentin Nightingale, OTR/L [phone] 07/21/2024, 1:54 PM

## 2024-07-21 NOTE — Therapy (Signed)
 OUTPATIENT PHYSICAL THERAPY NEURO TREATMENT   Patient Name: Angela Norman MRN: [id number] DOB:Jun 24, 1956, 68 y.o., female Today's Date: 07/21/2024   PCP: Sebastian Othel GAILS, FNP REFERRING PROVIDER: Edna Norris, MD  END OF SESSION:  PT End of Session - 07/21/24 1503     Visit Number 18    Number of Visits 24    Date for Recertification  07/28/24    Authorization Type Medicare A & B    Progress Note Due on Visit 21   PN complete visit #11   PT Start Time 1503    PT Stop Time 1545    PT Time Calculation (min) 42 min    Equipment Utilized During Treatment Gait belt    Activity Tolerance Patient tolerated treatment well    Behavior During Therapy WFL for tasks assessed/performed               Past Medical History:  Diagnosis Date   Anxiety    Arthritis    Basal cell carcinoma    Charcot's joint of foot    bilateral   Diabetes mellitus without complication (HCC)    Fibromyalgia    Neuropathy    Past Surgical History:  Procedure Laterality Date   ACHILLES TENDON REPAIR Left    bone spur Left    --left foot   BREAST SURGERY     benign breast bx--left--fatty tissue   CARPAL TUNNEL RELEASE Bilateral    COLONOSCOPY     COLONOSCOPY WITH PROPOFOL  N/A 09/06/2022   Procedure: COLONOSCOPY WITH PROPOFOL ;  Surgeon: Shaaron Lamar HERO, MD;  [facility]: [facility];  Service: Endoscopy;  Laterality: N/A;  10:30am, asa 2   DILATION AND CURETTAGE OF UTERUS  2011   for cervical polyp   FOOT SURGERY Left 03/2018   reconstructive surgery   POLYPECTOMY  09/06/2022   Procedure: POLYPECTOMY;  Surgeon: Shaaron Lamar HERO, MD;  [facility]: [facility];  Service: Endoscopy;;   Patient Active Problem List   Diagnosis Date Noted   Anxiety 06/28/2022   Arthritis 06/28/2022   Body fluid retention 06/28/2022   Hyperglycemia due to type 2 diabetes mellitus (HCC) 06/28/2022   Mixed hyperlipidemia 06/28/2022   Fibromyalgia 06/28/2022   Cough 10/25/2021   Musculoskeletal  pain 05/21/2012   Stiffness of joint, not elsewhere classified, ankle and foot 08/13/2011   Difficulty walking 08/13/2011   Ankle weakness 08/13/2011    ONSET DATE: November 2024  REFERRING DIAG: compression of spinal cord   THERAPY DIAG:  Compression of spinal cord (HCC)  Impaired functional mobility, balance, gait, and endurance  Muscle weakness (generalized)  Rationale for Evaluation and Treatment: Rehabilitation  SUBJECTIVE:  SUBJECTIVE STATEMENT: 07/21/24:  Reports ability to ambulate up stairs at home after the railing got put up.  Ability to walk around house 20 laps, trial walking no AD in kitchen with hand over counter.  Eval:  Pt reported all this started with UTI that caused fracture of neck and indicated as C4-C7 fusion. Pt had multiple bouts of acute and inpatient rehab stays due to complications. Significant BP issues throughout this time. Had stay in multiple facilities. Pt reported various changes in functional status, was requiring assist for ADLs and mobility since April 28th. Since then, pt has been home with Home Health services. Pt continues to report numbness in BUE and poor grip strength. Pt reports neuropathy in BLE. Prior to onset of November '24 was walking daily for 45-60 minutes.   Pt has CT scan for nodule on Lung on 04/28/24.  Pt accompanied by: self  PERTINENT HISTORY:  Cervical Myelopathy with ACDF 4-7 in November 2024\ Left ankle surgery x 3  PAIN:  Are you having pain? No and chronic pain in left shoulder  PRECAUTIONS: None  RED FLAGS: Bowel or bladder incontinence: Yes: SCI   WEIGHT BEARING RESTRICTIONS: No  FALLS: Has patient fallen in last 6 months? Yes. Number of falls 1-2   PATIENT GOALS:  want to walk with AD well and with confidence and  strengthening RUE.  OBJECTIVE:  Note: Objective measures were completed at Evaluation unless otherwise noted.  DIAGNOSTIC FINDINGS:   COGNITION: Overall cognitive status: Within functional limits for tasks assessed   SENSATION: Light touch: decreased light touch from lateral malleolus distally.   LOWER EXTREMITY ROM:     Active  Right Eval Left Eval  Hip flexion    Hip extension    Hip abduction    Hip adduction    Hip internal rotation    Hip external rotation    Knee flexion    Knee extension    Ankle dorsiflexion    Ankle plantarflexion    Ankle inversion    Ankle eversion     (Blank rows = not tested)  LOWER EXTREMITY MMT:    MMT Right Eval Left Eval Right 06/25/24 Left 06/25/24  Hip flexion      Hip extension      Hip abduction      Hip adduction      Hip internal rotation      Hip external rotation      Knee flexion      Knee extension 4- 4+ 4+ 5  Ankle dorsiflexion      Ankle plantarflexion      Ankle inversion      Ankle eversion      (Blank rows = not tested)   TRANSFERS: Sit to stand: Modified independence  Assistive device utilized: Environmental consultant - 2 wheeled     Stand to sit: Modified independence  Assistive device utilized: Environmental consultant - 2 wheeled     Chair to chair: SBA  Assistive device utilized: Environmental consultant - 2 wheeled       GAIT: Findings: Gait Characteristics: step through pattern, decreased arm swing- Right, decreased arm swing- Left, decreased step length- Right, decreased step length- Left, decreased hip/knee flexion- Right, decreased hip/knee flexion- Left, Right foot flat, Left foot flat, and narrow BOS, Distance walked: 57ft, Assistive device utilized:Walker - 2 wheeled, Level of assistance: Modified independence, and Comments:    FUNCTIONAL TESTS:  TUG: 25.71 seconds  DGI: 05/13/24: 8 / 24  L SLS: <0.1 seconds  R SLS: <0.1  seconds  [place]: 18-39  F: 43.5 seconds  M: 43.2 seconds 40-49  F: 40.4 seconds  M: 40.1 seconds 50-59  F: 36  seconds  M: 38.1 seconds 60-69  F: 25.1 seconds  M: 28.7 seconds 70-79  F: 11.3 seconds  M: 18.3 seconds  PATIENT SURVEYS:  ABC Scale:950 / 1600 = 59.4 %                                                                                                                              TREATMENT DATE:  07/21/24: - Nustep seat 8 x 5' level 7, pt avgs 70 SPM; [place] UE/LE - STS 15x no UE A - Reports slight light headedness, in a fog - Vitals checked Lt arm in seated position:  134/72 mmHg HR 76 - 6in step up and over 1 UE support required 10x  - Squat no HHA front of chair 10x - 6in lunge with 2 UE support 15x - Gait training with SBQC inside // bars cueing for sequence 3RT  07/16/24: - Nustep seat 8 x 5' level 7, pt avgs 70 SPM; [place] UE/LE - Cone placement in cabinet 1st lower shelf then the 2nd - Cone reaching forward - Dead lift while pulling cones towards her - Toe tapping 7in alteranating 20x 1 HHA - Lateral foot tapping between 6 and 8in step (required Rt UE support while using Rt LE) 2 HHA - Cone rotation standing NBOS on foam - 6in step up and over 1-2 UE support required 10x   Seated:  - Piriformis stretch 2x 30 07/14/24: - Nustep seat 8 x 5' level 7, pt avgs 70 SPM; [place] UE/LE - Foam step up and down with 1 HHA - Standing on faom no HHA, able to stand for a minutes without - STS holding Tidal tank 15x - Sidestep holding Tidal tank 2RT inside // bars with min A - Step up and over with 2 UE support 6in step height - Standing no HHA with head turns 2x 10 - Standing with UE flexion alternating 20x - Toe tapping 6in alteranating 20x 1 HHA - Lateral foot tapping between 6 and 8in step (required Rt UE support while using Rt LE) 2 HHA  07/09/24: -Nustep seat 8 x 5' level 7, pt avgs 70 SPM; [place] UE/LE - STS (no HHA) to heel raise (with HHA) 15x - Step up and overs, with 2 UE support, 6 inch step, 1 set of 3 reps (increased difficulty with 1 UE support) - Lateral  step up 6in 2 UE support 15x - Tandem stance with one foot on 6in step/other on floor 2x 30 then 5 head turns each foot on step - 12in hurdles forward and sidestep over 2RT with BUE support   07/07/24:   - Nustep UE/LE x 5' L6 [place] average SPM 70 - 12in hurdles forward and sidestep over 2RT with BUE support (attempted 1 but increased instability) - Step up and overs,  with only 1 UE support, 6 inch step, 1 set of 3 reps - 5 Sit to stand no UE support 14.87 - Squats front of chair 10x3 - Heel raise with UE support for improved ROM 2x 10 on incline slope - Tandem stance with one foot on 6in step/other on floor intermittent HHA required 2x 30    07/02/2024  Therapeutic Exercise: -Nustep seat 8 x 5' level 6, pt avgs 70 SPM -Leg press 3 sets of 10 reps plate 3>4, pt cued for LE alignment -Heel raises, 2 sets of 10 reps, pt cued for UE support for increased ROM  Therapeutic Activity: -Sit to stands from standard chair - 6 reps pt cued for core activation -Sled push, 20lbs, 2 laps, 50 foot laps, pt cued for consistent movement of sled and educated on functionality -Speed step ups, 2 bouts of 30 second trials (8.5 first set, 9.5 second set), pt cued for sequencing and increased speed -Step up and overs, with only 1 UE support, 6 inch step, 1 set of 3 reps    06/25/24 Progress note ABC 58.8% MMT's knee extension left 5; right 4+ TUG 21.38 sec with RW SLS left 2 and right 1  DGI 1. Gait level surface (2) Mild Impairment: Walks 20', uses assistive devices, slower speed, mild gait deviations. 2. Change in gait speed (2) Mild Impairment: Is able to change speed but demonstrates mild gait deviations, or not gait deviations but unable to achieve a significant change in velocity, or uses an assistive device. 3. Gait with horizontal head turns (2) Mild Impairment: Performs head turns smoothly with slight change in gait velocity, i.e., minor disruption to smooth gait path or uses  walking aid. 4. Gait with vertical head turns (2) Mild Impairment: Performs head turns smoothly with slight change in gait velocity, i.e., minor disruption to smooth gait path or uses walking aid. 5. Gait and pivot turn (2) Mild Impairment: Pivot turns safely in > 3 seconds and stops with no loss of balance. 6. Step over obstacle (1) Moderate Impairment: Is able to step over box but must stop, then step over. May require verbal cueing. 7. Step around obstacles (2) Mild Impairment: Is able to step around both cones, but must slow down and adjust steps to clear cones. 8. Stairs (1) Moderate Impairment: Two feet to a stair, must use rail.  TOTAL SCORE: 14 / 24   PATIENT EDUCATION: Education details: PT Evaluation, findings, prognosis, frequency, attendance policy. Person educated: Patient Education method: Explanation and Demonstration Education comprehension: verbalized understanding  HOME EXERCISE PROGRAM: Access Code: [email or web address] URL: [email or web address] Date: 05/14/2024 Prepared by: Augustin Mclean  Exercises - Sit to Stand  - 1 x daily - 7 x weekly - 3 sets - 10 reps - Standing March with Counter Support  - 1 x daily - 7 x weekly - 3 sets - 10 reps - Standing Hip Abduction with Counter Support  - 1 x daily - 7 x weekly - 3 sets - 10 reps - Side Stepping with Resistance at Thighs and Counter Support  - 1 x daily - 7 x weekly - 3 sets - 10 reps  06/30/24: - Squat with Chair and Counter Support  - 2 x daily - 7 x weekly - 1 sets - 10 reps - Standing Tandem Balance with Counter Support  - 1 x daily - 7 x weekly - 1 sets - 3 reps - 30 hold  GOALS: Goals reviewed with patient? No  SHORT TERM GOALS: Target  date: 05/25/2024  Pt will be independent with HEP in order to demonstrate participation in Physical Therapy POC.  Baseline: Goal status: MET  2.  Pt will improve ABC score by 15% in order to demonstrate improved pain with functional goals and outcomes. Baseline:   Goal status: IN PROGRESS  LONG TERM GOALS: Target date: 06/22/2024  Pt will increase DGI score by > MCID in order to demonstrate improved functional safety and balance skills in ADL/mobility.   Baseline: see objective. 06/25/24  14/24 Goal status: IN PROGRESS  2.  Pt will improve TUG by least 3 seconds in order to demonstrate improved functional mobility capacity in community setting.  Baseline: see objective. 06/25/24 21.38 sec with RW Goal status: MET  3.  Pt will improve ABC score by 30% in order to demonstrate improved pain with functional goals and outcomes. Baseline: see objective.  06/25/24 58.8% Goal status: IN PROGRESS  4.  Pt will improve single leg balance by at least 3 seconds bilaterally in order to demonstrate improved capacity, safety, and overall quality of movement to optimize function.  Baseline: see objective. 06/25/24 2 on left and 1 on right Goal status: IN PROGRESS  ASSESSMENT:  CLINICAL IMPRESSION: Session focus with LE strengthening and balance training.  Pt with c/o slight light headedness/fogginess following STS.  Vitals checked that were WNL for therapy.  Pt able to ambulate up and over 6in step with less UE support required.  Resumed squats and lunges for functional strengthening with good form and mechanics.  Pt has began walking without AD some in kitchen, close to counter.  Began gait training with SBQC inside // bars.  Pt able to complete good sequence with QC with intermittent HHA required and reduced cadence.  Pt encouraged to stay with walker until improves with strength and balance.  No reports of pain through session and improved activity tolerance with no rest breaks required today.    Eval:  Patient is a 68y.o. female who was seen today for physical therapy evaluation and treatment for 'compression of spinal cord.'  Patient with history of myelopathy C5 spinal fracture, with A C4 through C7 cervical fusion.  Patient presents with significant functional  mobility deficits, safety concerns, increased falls risk, reduced ambulation capacity at due to muscle weakness, decreased activity tolerance, balance deficits in setting of previous cervical myelopathy pt will benefit from skilled Physical Therapy services to address deficits/limitations in order to improve functional and QOL.    OBJECTIVE IMPAIRMENTS: Abnormal gait, decreased activity tolerance, decreased balance, decreased mobility, difficulty walking, decreased strength, and postural dysfunction.   ACTIVITY LIMITATIONS: carrying, lifting, bending, standing, squatting, stairs, transfers, bed mobility, and locomotion level  PARTICIPATION LIMITATIONS: meal prep, cleaning, laundry, community activity, occupation, and yard work  PERSONAL FACTORS: Age are also affecting patient's functional outcome.   REHAB POTENTIAL: Excellent  CLINICAL DECISION MAKING: Stable/uncomplicated  EVALUATION COMPLEXITY: Low  PLAN:  PT FREQUENCY: 2x/week  PT DURATION: 4 weeks  PLANNED INTERVENTIONS: 97164- PT Re-evaluation, 97750- Physical Performance Testing, 97110-Therapeutic exercises, 97530- Therapeutic activity, [id number]- Neuromuscular re-education, 97535- Self Care, 02859- Manual therapy, [phone]- Gait training, [phone]- Orthotic Initial, Patient/Family education, Balance training, Stair training, Taping, Joint mobilization, Joint manipulation, Spinal manipulation, Spinal mobilization, Cryotherapy, and Moist heat  PLAN FOR NEXT SESSION: Functional mobility, strengthening, ambulation, etc.  Reaching forward over low surfaces to improve confidence in bathroom.     Augustin Mclean, LPTA/CLT; CBIS [phone]  4:03 PM, 07/21/24

## 2024-07-23 ENCOUNTER — Ambulatory Visit (HOSPITAL_COMMUNITY): Attending: Surgery

## 2024-07-23 ENCOUNTER — Other Ambulatory Visit: Payer: Self-pay

## 2024-07-23 ENCOUNTER — Encounter (HOSPITAL_COMMUNITY): Payer: Self-pay | Admitting: Occupational Therapy

## 2024-07-23 ENCOUNTER — Encounter: Payer: Self-pay | Admitting: Emergency Medicine

## 2024-07-23 ENCOUNTER — Ambulatory Visit (HOSPITAL_COMMUNITY): Admitting: Occupational Therapy

## 2024-07-23 ENCOUNTER — Encounter (HOSPITAL_COMMUNITY): Payer: Self-pay

## 2024-07-23 ENCOUNTER — Ambulatory Visit (INDEPENDENT_AMBULATORY_CARE_PROVIDER_SITE_OTHER)
Admission: EM | Admit: 2024-07-23 | Discharge: 2024-07-23 | Disposition: A | Discharge status: Home / Self Care | Source: Home / Self Care

## 2024-07-23 DIAGNOSIS — R531 Weakness: Secondary | ICD-10-CM | POA: Diagnosis present

## 2024-07-23 DIAGNOSIS — R278 Other lack of coordination: Secondary | ICD-10-CM | POA: Insufficient documentation

## 2024-07-23 DIAGNOSIS — R29818 Other symptoms and signs involving the nervous system: Secondary | ICD-10-CM | POA: Insufficient documentation

## 2024-07-23 DIAGNOSIS — M6281 Muscle weakness (generalized): Secondary | ICD-10-CM | POA: Insufficient documentation

## 2024-07-23 DIAGNOSIS — G9520 Unspecified cord compression: Secondary | ICD-10-CM | POA: Insufficient documentation

## 2024-07-23 DIAGNOSIS — Z87440 Personal history of urinary (tract) infections: Secondary | ICD-10-CM | POA: Insufficient documentation

## 2024-07-23 DIAGNOSIS — Z7409 Other reduced mobility: Secondary | ICD-10-CM | POA: Insufficient documentation

## 2024-07-23 LAB — POCT URINE DIPSTICK
Bilirubin, UA: NEGATIVE
Glucose, UA: NEGATIVE mg/dL
Ketones, POC UA: NEGATIVE mg/dL
Nitrite, UA: NEGATIVE
POC PROTEIN,UA: NEGATIVE
Spec Grav, UA: 1.025 (ref 1.010–1.025)
Urobilinogen, UA: 0.2 U/dL
pH, UA: 5 (ref 5.0–8.0)

## 2024-07-23 MED ORDER — CEPHALEXIN 500 MG PO CAPS
500.0000 mg | ORAL_CAPSULE | Freq: Two times a day (BID) | ORAL | 0 refills | Status: DC
Start: 2024-07-23 — End: 2024-07-23

## 2024-07-23 MED ORDER — CEPHALEXIN 500 MG PO CAPS: 500.0000 mg | ORAL_CAPSULE | Freq: Four times a day (QID) | ORAL | 0 refills | Status: AC

## 2024-07-23 NOTE — ED Triage Notes (Signed)
 Pt reports recurrent history of cystitis and UTI. Last episode several weeks ago. Pt reports has home test strips and reports it was positive this afternoon and would like to be checked for UTI. Denies any pain or known symptoms at this time.

## 2024-07-23 NOTE — Therapy (Signed)
 OUTPATIENT OCCUPATIONAL THERAPY NEURO TREATMENT NOTE   Patient Name: Angela Norman MRN: [id number] DOB:[birth year], 68 y.o., female Today's Date: 07/23/2024    PCP: Shona Norleen PEDLAR, MD REFERRING PROVIDER: Hillman Amour, MD  END OF SESSION:  OT End of Session - 07/23/24 1419     Visit Number 18    Number of Visits 24    Date for Recertification  08/14/24    Authorization Type Medicare Part A and B    Progress Note Due on Visit 19    OT Start Time 1328    OT Stop Time 1419    OT Time Calculation (min) 51 min    Activity Tolerance Patient tolerated treatment well    Behavior During Therapy WFL for tasks assessed/performed            Past Medical History:  Diagnosis Date   Anxiety    Arthritis    Basal cell carcinoma    Charcot's joint of foot    bilateral   Diabetes mellitus without complication (HCC)    Fibromyalgia    Neuropathy    Past Surgical History:  Procedure Laterality Date   ACHILLES TENDON REPAIR Left    bone spur Left    --left foot   BREAST SURGERY     benign breast bx--left--fatty tissue   CARPAL TUNNEL RELEASE Bilateral    COLONOSCOPY     COLONOSCOPY WITH PROPOFOL  N/A 09/06/2022   Procedure: COLONOSCOPY WITH PROPOFOL ;  Surgeon: Shaaron Lamar HERO, MD;  [facility]: [facility];  Service: Endoscopy;  Laterality: N/A;  10:30am, asa 2   DILATION AND CURETTAGE OF UTERUS  2011   for cervical polyp   FOOT SURGERY Left 03/2018   reconstructive surgery   POLYPECTOMY  09/06/2022   Procedure: POLYPECTOMY;  Surgeon: Shaaron Lamar HERO, MD;  [facility]: [facility];  Service: Endoscopy;;   Patient Active Problem List   Diagnosis Date Noted   Anxiety 06/28/2022   Arthritis 06/28/2022   Body fluid retention 06/28/2022   Hyperglycemia due to type 2 diabetes mellitus (HCC) 06/28/2022   Mixed hyperlipidemia 06/28/2022   Fibromyalgia 06/28/2022   Cough 10/25/2021   Musculoskeletal pain 05/21/2012   Stiffness of joint, not elsewhere classified, ankle  and foot 08/13/2011   Difficulty walking 08/13/2011   Ankle weakness 08/13/2011    ONSET DATE: 08/2023  REFERRING DIAG:  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 Pt had multiple bouts of acute and inpatient rehab stays due to complications. Significant BP issues throughout this time. Had stay in multiple facilities. Pt reported various changes in functional status, was requiring assist for ADLs and mobility since April 28th. Since then, pt has been home with Home Health services. Pt continues to report numbness in BUE and poor grip strength. Pt reports neuropathy in BLE. Prior to onset of November '24 was walking daily for 45-60 minutes.   PRECAUTIONS: Fall  WEIGHT BEARING RESTRICTIONS: No  PAIN:  Are you having pain? Yes: NPRS scale: 4/10 Pain location: left shoulder and arm Pain description: sore to the touch Aggravating factors: overuse Relieving factors: pain cream  FALLS: Has patient fallen in last  6 months? No  LIVING ENVIRONMENT: Lives with: lives with their spouse Lives in: House/apartment Stairs: Pt lives in the basement with no stairs Has following equipment at home: Vannie - 2 wheeled and shower chair  PLOF: Independent  PATIENT GOALS: To improve mobility and strengthening  OBJECTIVE:  Note: Objective measures were completed at Evaluation unless otherwise noted.  HAND DOMINANCE: Right  ADLs: Overall ADLs: Pt has difficulty with all small object manipulation, including but not limited to buttons, shoe laces, and zippers. Additionally she has max difficulty with cooking and  cleaning due to weakness in both hands and wrists.   MOBILITY STATUS: Hx of falls, difficulty with turns, and difficulty carrying objections with ambulation  POSTURE COMMENTS:  rounded shoulders Sitting balance: Moves/returns truncal midpoint 1-2 inches in multiple planes  ACTIVITY TOLERANCE: Activity tolerance: Fair tolerance - fatigues quicker than normal  FUNCTIONAL OUTCOME MEASURES: Quick Dash: 47.73 07/16/24: 20.45  UPPER EXTREMITY ROM:    All ROM is [facility]  UPPER EXTREMITY MMT:     MMT Right eval Left eval Right 07/16/24 Left 07/16/24  Shoulder flexion 5/5 5/5 5/5 5/5  Shoulder abduction 5/5 5/5 5/5 4/5  Shoulder internal rotation 5/5 5/5 5/5 5/5  Shoulder external rotation 5/5 5/5 4+/5 4+/5  Elbow flexion 5/5 5/5 5/5 5/5  Elbow extension 5/5 5/5 4+/5 4+/5  Wrist flexion 4/5 4+/5 4+/5 5/5  Wrist extension 4-/5 4/5 5/5 5/5  Wrist ulnar deviation 4-/5 4/5 4/5 4+/5  Wrist radial deviation 4-/5 4-/5 5/5 5/5  Wrist pronation 4/5 4+/5 5/5 5/5  Wrist supination 4/5 4/5 4+/5 4+/5  (Blank rows = not tested)  HAND FUNCTION: Grip strength: Right: 20 lbs; Left: 27 lbs, Lateral pinch: Right: 5 lbs, Left: 9 lbs, and 3 point pinch: Right: 5 lbs, Left: 4 lbs 07/16/24: Grip strength: Right: 31 lbs; Left: 35 lbs, Lateral pinch: Right: 7 lbs, Left: 8 lbs, and 3 point pinch: Right: 7 lbs, Left: 9 lbs  COORDINATION: 06/30/24-9 Hole Peg test: Right: 33.28 sec; Left: 38.26 sec 07/16/24- 9 Hole Peg test: Right: 52.15 sec; Left: 49.88 sec  SENSATION: Pt has full numbness along median nerve of R hand as well as dulled sensation and tingling in BUE up to elbows.   EDEMA: No swelling noted  OBSERVATIONS: adducted thumbs and 80% grip                                                                                                                             TREATMENT DATE:  07/23/24 -Myofascial release to left anterior shoulder to decrease pain and fascial restrictions and increase functional  use of LUE during ADLs -Hand gripper, BUE: Right-large and medium beads with gripper vertical at 25#; Left-large and medium beads gripper horizontal at 25# -AA/ROM: flexion, 5 reps -Table building: Pt working on building small table incorporating pinch strength, bilateral coordination, and manual dexterity into tasks. Pt using left hand to stabilize and hold pieces, right hand for manipulating and  placement of pieces.     07/21/24 -gripper: 22# and 25# BUE large and medium beads -Pinch Tree: 1# wrist weights -Flex bar: teal - wrist flexion/extension, supination/pronation, ulnar/radial deviation, x10 -coins: picking up and holding 10 coins, placing them in piggy bank.  07/16/24 - 9 hole peg test x5 working on hand placement - A/ROM: shoulder flexion, abduction, er/IR, elbow flexion/extension, x10 -Wrist ROM: flexion, extension, ulnar/radial deviation, supination/pronation, x10 -Threading beads on a string x10 small holed beaded letters -Reassessment   PATIENT EDUCATION: Education details: Continue HEP Person educated: Patient Education method: Programmer, multimedia, Demonstration, and Handouts Education comprehension: verbalized understanding and returned demonstration  HOME EXERCISE PROGRAM: 8/1: Wrist ROM and Digit ROM 8/8: Shoulder A/ROM 8/20: Scapular Strengthening 9/4: Coin work 9/18: theraband wrist strengthening-red   GOALS: Goals reviewed with patient? Yes  SHORT TERM GOALS: Target date: 06/19/24  Pt will be provided and educated on a comprehensive HEP for BUE mobility in order to complete ADL's and IADL's independently.   Goal status: IN PROGRESS  2.  Pt will improve BUE strength to 5/5 in order to lift and carry items during cooking and cleaning tasks.   Goal status: IN PROGRESS  3.  Pt will improve BUE grip by 15# and pinch by 3# in order to grasp and hold pots and pans.  Goal status: IN PROGRESS  4.  Pt will improve BUE coordination by completing 9 hole peg test in 45  or less in order to manipulate and complete fine motor tasks.   Goal status: MET   ASSESSMENT:  CLINICAL IMPRESSION:  Pt reporting pain in her left shoulder that is impacting her ability to complete ADLs. Manual techniques completed to LUE while pt completing task with RUE. Continued with grip strengthening, bilateral coordination and dexterity work while incorporating pinch work with table building task. Pt with min/mod fatigue during activities, rest breaks provided as needed. Tasks focusing on improved functional use of BUE during ADLs.    PERFORMANCE DEFICITS: in functional skills including ADLs, IADLs, coordination, dexterity, sensation, ROM, strength, pain, fascial restrictions, Fine motor control, Gross motor control, body mechanics, and UE functional use.    PLAN:  OT FREQUENCY: 2x/week  OT DURATION: 4 weeks  PLANNED INTERVENTIONS: 97168 OT Re-evaluation, 97535 self care/ADL training, 02889 therapeutic exercise, 97530 therapeutic activity, 97112 neuromuscular re-education, 97140 manual therapy, 97035 ultrasound, 97018 paraffin, 02989 moist heat, 97032 electrical stimulation (manual), passive range of motion, functional mobility training, energy conservation, coping strategies training, patient/family education, and DME and/or AE instructions  CONSULTED AND AGREED WITH PLAN OF CARE: Patient  PLAN FOR NEXT SESSION: Wrist and Digit ROM, strengthening, coordination tasks   Sonny Cory, OTR/L  [phone] 07/23/2024, 2:20 PM

## 2024-07-23 NOTE — ED Provider Notes (Signed)
 [facility]    CSN: [id number] Arrival date & time: 07/23/24  1833      History   Chief Complaint Chief Complaint  Patient presents with   Weakness    HPI RAFAEL QUESADA is a 68 y.o. female.   The history is provided by the patient.   Patient presents for concern for possible urinary tract infection.  Patient states over the past week or so, she has been experiencing increased weakness and mild lightheadedness.  She states that she was diagnosed with UTI last December and symptoms started out similarly.  Patient states her last UTI was approximately 1 month ago.  She denies fever, chills, dysuria, urinary frequency, urgency, hesitancy, flank pain, decreased urine stream, or hematuria.  Patient states that she did take a home test for a UTI which was positive.    Past Medical History:  Diagnosis Date   Anxiety    Arthritis    Basal cell carcinoma    Charcot's joint of foot    bilateral   Diabetes mellitus without complication [facility]    Fibromyalgia    Neuropathy     Patient Active Problem List   Diagnosis Date Noted   Anxiety 06/28/2022   Arthritis 06/28/2022   Body fluid retention 06/28/2022   Hyperglycemia due to type 2 diabetes mellitus (HCC) 06/28/2022   Mixed hyperlipidemia 06/28/2022   Fibromyalgia 06/28/2022   Cough 10/25/2021   Musculoskeletal pain 05/21/2012   Stiffness of joint, not elsewhere classified, ankle and foot 08/13/2011   Difficulty walking 08/13/2011   Ankle weakness 08/13/2011    Past Surgical History:  Procedure Laterality Date   ACHILLES TENDON REPAIR Left    bone spur Left    --left foot   BREAST SURGERY     benign breast bx--left--fatty tissue   CARPAL TUNNEL RELEASE Bilateral    COLONOSCOPY     COLONOSCOPY WITH PROPOFOL  N/A 09/06/2022   Procedure: COLONOSCOPY WITH PROPOFOL ;  Surgeon: Shaaron Lamar HERO, MD;  [facility];  Service: Endoscopy;  Laterality: N/A;  10:30am, asa 2   DILATION AND CURETTAGE OF  UTERUS  2011   for cervical polyp   FOOT SURGERY Left 03/2018   reconstructive surgery   POLYPECTOMY  09/06/2022   Procedure: POLYPECTOMY;  Surgeon: Shaaron Lamar HERO, MD;  [facility];  Service: Endoscopy;;    OB History     Gravida  2   Para  2   Term  2   Preterm      AB      Living  2      SAB      IAB      Ectopic      Multiple      Live Births               Home Medications    Prior to Admission medications   Medication Sig Start Date End Date Taking? Authorizing Provider  cephALEXin  (KEFLEX ) 500 MG capsule Take 1 capsule (500 mg total) by mouth 2 (two) times daily for 7 days. 07/23/24 07/30/24 Yes Leath-Warren, Etta PARAS, NP  baclofen (LIORESAL) 10 MG tablet Take 10 mg by mouth at bedtime.    [provider]  cyclobenzaprine (FLEXERIL) 5 MG tablet Take 5 mg by mouth every 8 (eight) hours as needed. 01/17/23   [provider]  fluticasone (CUTIVATE) 0.05 % cream Apply 1 Application topically 2 (two) times daily as needed. 06/02/24   [provider]  gabapentin (NEURONTIN) 600 MG tablet Take 300-600 mg by mouth See admin instructions. Take 300 mg in the afternoon and 600 mg at bedtime    [provider]  meclizine  (ANTIVERT ) 25 MG tablet Take 1 tablet (25 mg total) by mouth 3 (three) times daily as needed for dizziness. 06/21/24   Ula Prentice SAUNDERS, MD  metFORMIN (GLUCOPHAGE) 500 MG tablet Take 500 mg by mouth 2 (two) times daily with a meal.    [provider]  metolazone (ZAROXOLYN) 5 MG tablet Take 5 mg by mouth once a week.    [provider]  [facility] VERIO test strip daily. 09/17/22   [provider]  Polyethyl Glycol-Propyl Glycol (SYSTANE) 0.4-0.3 % SOLN Place 1 drop into both eyes daily as needed (dry eyes). PF orginal    [provider]    Family History Family History  Problem Relation Age of Onset   Diabetes Mother    Hypertension Mother    Stroke Mother    Other  Mother        myeloproliferative disorder   Alzheimer's disease Father    Hypertension Father    Diabetes Maternal Grandfather    Hypertension Maternal Grandmother    Stroke Maternal Grandmother     Social History Social History   Tobacco Use   Smoking status: Never   Smokeless tobacco: Never  Vaping Use   Vaping status: Never Used  Substance Use Topics   Alcohol use: Yes    Comment: rarely   Drug use: No     Allergies   Patient has no known allergies.   Review of Systems Review of Systems Per HPI  Physical Exam Triage Vital Signs ED Triage Vitals  Encounter Vitals Group     BP 07/23/24 1848 (!) 141/82     Girls Systolic BP Percentile --      Girls Diastolic BP Percentile --      Boys Systolic BP Percentile --      Boys Diastolic BP Percentile --      Pulse Rate 07/23/24 1848 73     Resp 07/23/24 1848 20     Temp 07/23/24 1848 98.2 F (36.8 C)     Temp Source 07/23/24 1848 Oral     SpO2 07/23/24 1848 97 %     Weight --      Height --      Head Circumference --      Peak Flow --      Pain Score 07/23/24 1846 0     Pain Loc --      Pain Education --      Exclude from Growth Chart --    No data found.  Updated Vital Signs BP (!) 141/82 (BP Location: Right Arm)   Pulse 73   Temp 98.2 F (36.8 C) (Oral)   Resp 20   LMP 10/22/2008   SpO2 97%   Visual Acuity Right Eye Distance:   Left Eye Distance:   Bilateral Distance:    Right Eye Near:   Left Eye Near:    Bilateral Near:     Physical Exam Vitals and nursing note reviewed.  Constitutional:      General: She is not in acute distress.    Appearance: Normal appearance.  HENT:     Head: Normocephalic.  Eyes:     Extraocular Movements: Extraocular movements intact.     Pupils: Pupils are equal, round, and reactive to light.  Cardiovascular:     Rate and  Rhythm: Normal rate and regular rhythm.     Pulses: Normal pulses.     Heart sounds: Normal heart sounds.  Pulmonary:     Effort:  Pulmonary effort is normal. No respiratory distress.     Breath sounds: Normal breath sounds. No stridor. No wheezing, rhonchi or rales.  Abdominal:     General: Bowel sounds are normal.     Palpations: Abdomen is soft.     Tenderness: There is no abdominal tenderness. There is no right CVA tenderness or left CVA tenderness.  Musculoskeletal:     Cervical back: Normal range of motion.  Skin:    General: Skin is warm and dry.  Neurological:     General: No focal deficit present.     Mental Status: She is alert and oriented to person, place, and time.  Psychiatric:        Mood and Affect: Mood normal.        Behavior: Behavior normal.      UC Treatments / Results  Labs (all labs ordered are listed, but only abnormal results are displayed) Labs Reviewed  POCT URINE DIPSTICK - Abnormal; Notable for the following components:      Result Value   Blood, UA trace-intact (*)    Leukocytes, UA Trace (*)    All other components within normal limits    EKG   Radiology No results found.  Procedures Procedures (including critical care time)  Medications Ordered in UC Medications - No data to display  Initial Impression / Assessment and Plan / UC Course  I have reviewed the triage vital signs and the nursing notes.  Pertinent labs & imaging results that were available during my care of the patient were reviewed by me and considered in my medical decision making (see chart for details).  Urinalysis was positive for leukocytes and blood.  Suspicious for urinary tract infection, although evidence is not highly convincing.  Will cover patient with Keflex  500 mg as she has a history of becoming acutely ill with UTIs.  Urine culture is pending.  Supportive care recommendations were provided and discussed with the patient to include fluids, rest, over-the-counter analgesics, developing a toileting schedule, and avoiding caffeine.  Patient was advised if the urine culture result is negative  and she is continue to experience symptoms, recommend follow-up with her PCP for further evaluation.  Patient was in agreement with this plan of care and verbalizes understanding.  All questions were answered.  Patient stable for discharge.   Final Clinical Impressions(s) / UC Diagnoses   Final diagnoses:  Weakness  History of recurrent UTI (urinary tract infection)     Discharge Instructions      Urine culture is pending.  You will be contacted when the results of the urine culture are received.  You will also have access to the results via MyChart. Take medication as prescribed. Increase fluids.  Try to drink at least 5-7 8 ounce glasses of water daily while symptoms persist. You may take over-the-counter Tylenol as needed for pain, fever, or general discomfort. Develop a toileting schedule that will allow you to urinate at least every 2 hours. Avoid caffeine such as tea, soda, or coffee while symptoms persist. If the results of the culture are negative and you are continue to experience symptoms, please follow-up with your primary care physician for further evaluation. Follow-up as needed.     ED Prescriptions     Medication Sig Dispense Auth. Provider   cephALEXin  (KEFLEX ) 500 MG  capsule Take 1 capsule (500 mg total) by mouth 2 (two) times daily for 7 days. 14 capsule Leath-Warren, Etta PARAS, NP      PDMP not reviewed this encounter.   Gilmer Etta PARAS, NP 07/23/24 1919

## 2024-07-23 NOTE — Discharge Instructions (Addendum)
 Urine culture is pending.  You will be contacted when the results of the urine culture are received.  You will also have access to the results via MyChart. Take medication as prescribed. Increase fluids.  Try to drink at least 5-7 8 ounce glasses of water daily while symptoms persist. You may take over-the-counter Tylenol as needed for pain, fever, or general discomfort. Develop a toileting schedule that will allow you to urinate at least every 2 hours. Avoid caffeine such as tea, soda, or coffee while symptoms persist. If the results of the culture are negative and you are continue to experience symptoms, please follow-up with your primary care physician for further evaluation. Follow-up as needed.

## 2024-07-23 NOTE — Therapy (Signed)
 OUTPATIENT PHYSICAL THERAPY NEURO TREATMENT   Patient Name: Angela Norman MRN: [id number] DOB:01/11/1956, 68 y.o., female Today's Date: 07/23/2024   PCP: Sebastian Othel GAILS, FNP REFERRING PROVIDER: Edna Norris, MD  END OF SESSION:  PT End of Session - 07/23/24 1508     Visit Number 19    Number of Visits 24    Date for Recertification  07/28/24    Authorization Type Medicare A & B    Progress Note Due on Visit 21   PN complete visit #11   PT Start Time 1504    PT Stop Time 1548    PT Time Calculation (min) 44 min    Equipment Utilized During Treatment Gait belt    Activity Tolerance Patient tolerated treatment well    Behavior During Therapy WFL for tasks assessed/performed               Past Medical History:  Diagnosis Date   Anxiety    Arthritis    Basal cell carcinoma    Charcot's joint of foot    bilateral   Diabetes mellitus without complication (HCC)    Fibromyalgia    Neuropathy    Past Surgical History:  Procedure Laterality Date   ACHILLES TENDON REPAIR Left    bone spur Left    --left foot   BREAST SURGERY     benign breast bx--left--fatty tissue   CARPAL TUNNEL RELEASE Bilateral    COLONOSCOPY     COLONOSCOPY WITH PROPOFOL  N/A 09/06/2022   Procedure: COLONOSCOPY WITH PROPOFOL ;  Surgeon: Shaaron Lamar HERO, MD;  Location: [facility];  Service: Endoscopy;  Laterality: N/A;  10:30am, asa 2   DILATION AND CURETTAGE OF UTERUS  2011   for cervical polyp   FOOT SURGERY Left 03/2018   reconstructive surgery   POLYPECTOMY  09/06/2022   Procedure: POLYPECTOMY;  Surgeon: Shaaron Lamar HERO, MD;  Location: [facility];  Service: Endoscopy;;   Patient Active Problem List   Diagnosis Date Noted   Anxiety 06/28/2022   Arthritis 06/28/2022   Body fluid retention 06/28/2022   Hyperglycemia due to type 2 diabetes mellitus (HCC) 06/28/2022   Mixed hyperlipidemia 06/28/2022   Fibromyalgia 06/28/2022   Cough 10/25/2021   Musculoskeletal  pain 05/21/2012   Stiffness of joint, not elsewhere classified, ankle and foot 08/13/2011   Difficulty walking 08/13/2011   Ankle weakness 08/13/2011    ONSET DATE: November 2024  REFERRING DIAG: compression of spinal cord   THERAPY DIAG:  Compression of spinal cord (HCC)  Impaired functional mobility, balance, gait, and endurance  Muscle weakness (generalized)  Rationale for Evaluation and Treatment: Rehabilitation  SUBJECTIVE:  SUBJECTIVE STATEMENT: 07/23/24:  REports some pain Rt foot following step down exercise last session.  No reports of pain currently.  Eval:  Pt reported all this started with UTI that caused fracture of neck and indicated as C4-C7 fusion. Pt had multiple bouts of acute and inpatient rehab stays due to complications. Significant BP issues throughout this time. Had stay in multiple facilities. Pt reported various changes in functional status, was requiring assist for ADLs and mobility since April 28th. Since then, pt has been home with Home Health services. Pt continues to report numbness in BUE and poor grip strength. Pt reports neuropathy in BLE. Prior to onset of November '24 was walking daily for 45-60 minutes.   Pt has CT scan for nodule on Lung on 04/28/24.  Pt accompanied by: self  PERTINENT HISTORY:  Cervical Myelopathy with ACDF 4-7 in November 2024\ Left ankle surgery x 3  PAIN:  Are you having pain? No and chronic pain in left shoulder  PRECAUTIONS: None  RED FLAGS: Bowel or bladder incontinence: Yes: SCI   WEIGHT BEARING RESTRICTIONS: No  FALLS: Has patient fallen in last 6 months? Yes. Number of falls 1-2   PATIENT GOALS:  want to walk with AD well and with confidence and strengthening RUE.  OBJECTIVE:  Note: Objective measures were completed at  Evaluation unless otherwise noted.  DIAGNOSTIC FINDINGS:   COGNITION: Overall cognitive status: Within functional limits for tasks assessed   SENSATION: Light touch: decreased light touch from lateral malleolus distally.   LOWER EXTREMITY ROM:     Active  Right Eval Left Eval  Hip flexion    Hip extension    Hip abduction    Hip adduction    Hip internal rotation    Hip external rotation    Knee flexion    Knee extension    Ankle dorsiflexion    Ankle plantarflexion    Ankle inversion    Ankle eversion     (Blank rows = not tested)  LOWER EXTREMITY MMT:    MMT Right Eval Left Eval Right 06/25/24 Left 06/25/24  Hip flexion      Hip extension      Hip abduction      Hip adduction      Hip internal rotation      Hip external rotation      Knee flexion      Knee extension 4- 4+ 4+ 5  Ankle dorsiflexion      Ankle plantarflexion      Ankle inversion      Ankle eversion      (Blank rows = not tested)   TRANSFERS: Sit to stand: Modified independence  Assistive device utilized: Environmental consultant - 2 wheeled     Stand to sit: Modified independence  Assistive device utilized: Environmental consultant - 2 wheeled     Chair to chair: SBA  Assistive device utilized: Environmental consultant - 2 wheeled       GAIT: Findings: Gait Characteristics: step through pattern, decreased arm swing- Right, decreased arm swing- Left, decreased step length- Right, decreased step length- Left, decreased hip/knee flexion- Right, decreased hip/knee flexion- Left, Right foot flat, Left foot flat, and narrow BOS, Distance walked: 33ft, Assistive device utilized:Walker - 2 wheeled, Level of assistance: Modified independence, and Comments:    FUNCTIONAL TESTS:  TUG: 25.71 seconds  DGI: 05/13/24: 8 / 24  L SLS: <0.1 seconds  R SLS: <0.1 seconds  [place]: 18-39  F: 43.5 seconds  M: 43.2 seconds 40-49  F:  40.4 seconds  M: 40.1 seconds 50-59  F: 36 seconds  M: 38.1 seconds 60-69  F: 25.1 seconds  M: 28.7 seconds 70-79  F: 11.3  seconds  M: 18.3 seconds  PATIENT SURVEYS:  ABC Scale:950 / 1600 = 59.4 %                                                                                                                              TREATMENT DATE:  07/23/24: - STS holding spiral tidal tank 8 reps then report of Lt shoulder pain, given blue ball without weight for 12 more reps - Dead lift incorporating low reaching activities 2x 10 - Cone rotation standing NBOS on foam - 6in lunge with 1 UE support 15x - Squat front of chair 20x - Nustep [place] UE/LE seat 8 L7 x 5'  07/21/24: - Nustep seat 8 x 5' level 7, pt avgs 70 SPM; [place] UE/LE - STS 15x no UE A - Reports slight light headedness, in a fog - Vitals checked Lt arm in seated position:  134/72 mmHg HR 76 - 6in step up and over 1 UE support required 10x  - Squat no HHA front of chair 10x - 6in lunge with 2 UE support 15x - Gait training with SBQC inside // bars cueing for sequence 3RT  07/16/24: - Nustep seat 8 x 5' level 7, pt avgs 70 SPM; [place] UE/LE - Cone placement in cabinet 1st lower shelf then the 2nd - Cone reaching forward - Dead lift while pulling cones towards her - Toe tapping 7in alteranating 20x 1 HHA - Lateral foot tapping between 6 and 8in step (required Rt UE support while using Rt LE) 2 HHA - Cone rotation standing NBOS on foam - 6in step up and over 1-2 UE support required 10x   Seated:  - Piriformis stretch 2x 30 07/14/24: - Nustep seat 8 x 5' level 7, pt avgs 70 SPM; [place] UE/LE - Foam step up and down with 1 HHA - Standing on faom no HHA, able to stand for a minutes without - STS holding Tidal tank 15x - Sidestep holding Tidal tank 2RT inside // bars with min A - Step up and over with 2 UE support 6in step height - Standing no HHA with head turns 2x 10 - Standing with UE flexion alternating 20x - Toe tapping 6in alteranating 20x 1 HHA - Lateral foot tapping between 6 and 8in step (required Rt UE support while using Rt  LE) 2 HHA  07/09/24: -Nustep seat 8 x 5' level 7, pt avgs 70 SPM; [place] UE/LE - STS (no HHA) to heel raise (with HHA) 15x - Step up and overs, with 2 UE support, 6 inch step, 1 set of 3 reps (increased difficulty with 1 UE support) - Lateral step up 6in 2 UE support 15x - Tandem stance with one foot on 6in step/other on floor 2x 30 then 5 head turns each foot on  step - 12in hurdles forward and sidestep over 2RT with BUE support   07/07/24:   - Nustep UE/LE x 5' L6 [place] average SPM 70 - 12in hurdles forward and sidestep over 2RT with BUE support (attempted 1 but increased instability) - Step up and overs, with only 1 UE support, 6 inch step, 1 set of 3 reps - 5 Sit to stand no UE support 14.87 - Squats front of chair 10x3 - Heel raise with UE support for improved ROM 2x 10 on incline slope - Tandem stance with one foot on 6in step/other on floor intermittent HHA required 2x 30    07/02/2024  Therapeutic Exercise: -Nustep seat 8 x 5' level 6, pt avgs 70 SPM -Leg press 3 sets of 10 reps plate 3>4, pt cued for LE alignment -Heel raises, 2 sets of 10 reps, pt cued for UE support for increased ROM  Therapeutic Activity: -Sit to stands from standard chair - 6 reps pt cued for core activation -Sled push, 20lbs, 2 laps, 50 foot laps, pt cued for consistent movement of sled and educated on functionality -Speed step ups, 2 bouts of 30 second trials (8.5 first set, 9.5 second set), pt cued for sequencing and increased speed -Step up and overs, with only 1 UE support, 6 inch step, 1 set of 3 reps    06/25/24 Progress note ABC 58.8% MMT's knee extension left 5; right 4+ TUG 21.38 sec with RW SLS left 2 and right 1  DGI 1. Gait level surface (2) Mild Impairment: Walks 20', uses assistive devices, slower speed, mild gait deviations. 2. Change in gait speed (2) Mild Impairment: Is able to change speed but demonstrates mild gait deviations, or not gait deviations but unable to  achieve a significant change in velocity, or uses an assistive device. 3. Gait with horizontal head turns (2) Mild Impairment: Performs head turns smoothly with slight change in gait velocity, i.e., minor disruption to smooth gait path or uses walking aid. 4. Gait with vertical head turns (2) Mild Impairment: Performs head turns smoothly with slight change in gait velocity, i.e., minor disruption to smooth gait path or uses walking aid. 5. Gait and pivot turn (2) Mild Impairment: Pivot turns safely in > 3 seconds and stops with no loss of balance. 6. Step over obstacle (1) Moderate Impairment: Is able to step over box but must stop, then step over. May require verbal cueing. 7. Step around obstacles (2) Mild Impairment: Is able to step around both cones, but must slow down and adjust steps to clear cones. 8. Stairs (1) Moderate Impairment: Two feet to a stair, must use rail.  TOTAL SCORE: 14 / 24   PATIENT EDUCATION: Education details: PT Evaluation, findings, prognosis, frequency, attendance policy. Person educated: Patient Education method: Explanation and Demonstration Education comprehension: verbalized understanding  HOME EXERCISE PROGRAM: Access Code: [email or web address] URL: [email or web address] Date: 05/14/2024 Prepared by: Augustin Mclean  Exercises - Sit to Stand  - 1 x daily - 7 x weekly - 3 sets - 10 reps - Standing March with Counter Support  - 1 x daily - 7 x weekly - 3 sets - 10 reps - Standing Hip Abduction with Counter Support  - 1 x daily - 7 x weekly - 3 sets - 10 reps - Side Stepping with Resistance at Thighs and Counter Support  - 1 x daily - 7 x weekly - 3 sets - 10 reps  06/30/24: - Squat with Chair and Counter Support  - 2  x daily - 7 x weekly - 1 sets - 10 reps - Standing Tandem Balance with Counter Support  - 1 x daily - 7 x weekly - 1 sets - 3 reps - 30 hold  GOALS: Goals reviewed with patient? No  SHORT TERM GOALS: Target date: 05/25/2024  Pt  will be independent with HEP in order to demonstrate participation in Physical Therapy POC.  Baseline: Goal status: MET  2.  Pt will improve ABC score by 15% in order to demonstrate improved pain with functional goals and outcomes. Baseline:  Goal status: IN PROGRESS  LONG TERM GOALS: Target date: 06/22/2024  Pt will increase DGI score by > MCID in order to demonstrate improved functional safety and balance skills in ADL/mobility.   Baseline: see objective. 06/25/24  14/24 Goal status: IN PROGRESS  2.  Pt will improve TUG by least 3 seconds in order to demonstrate improved functional mobility capacity in community setting.  Baseline: see objective. 06/25/24 21.38 sec with RW Goal status: MET  3.  Pt will improve ABC score by 30% in order to demonstrate improved pain with functional goals and outcomes. Baseline: see objective.  06/25/24 58.8% Goal status: IN PROGRESS  4.  Pt will improve single leg balance by at least 3 seconds bilaterally in order to demonstrate improved capacity, safety, and overall quality of movement to optimize function.  Baseline: see objective. 06/25/24 2 on left and 1 on right Goal status: IN PROGRESS  ASSESSMENT:  CLINICAL IMPRESSION: Session focus with LE strengthening and balance training.  Pt able to complete squats and lunges for functional strengthening with good form and mechanics, able to complete with less UE support.  Pt able to complete all exercises with no reports of increased pain and no need for rest breaks.  Resumed dead lifting with improved mechanics incorporated with forward reaching on lower surface.    Eval:  Patient is a 68y.o. female who was seen today for physical therapy evaluation and treatment for 'compression of spinal cord.'  Patient with history of myelopathy C5 spinal fracture, with A C4 through C7 cervical fusion.  Patient presents with significant functional mobility deficits, safety concerns, increased falls risk, reduced ambulation  capacity at due to muscle weakness, decreased activity tolerance, balance deficits in setting of previous cervical myelopathy pt will benefit from skilled Physical Therapy services to address deficits/limitations in order to improve functional and QOL.    OBJECTIVE IMPAIRMENTS: Abnormal gait, decreased activity tolerance, decreased balance, decreased mobility, difficulty walking, decreased strength, and postural dysfunction.   ACTIVITY LIMITATIONS: carrying, lifting, bending, standing, squatting, stairs, transfers, bed mobility, and locomotion level  PARTICIPATION LIMITATIONS: meal prep, cleaning, laundry, community activity, occupation, and yard work  PERSONAL FACTORS: Age are also affecting patient's functional outcome.   REHAB POTENTIAL: Excellent  CLINICAL DECISION MAKING: Stable/uncomplicated  EVALUATION COMPLEXITY: Low  PLAN:  PT FREQUENCY: 2x/week  PT DURATION: 4 weeks  PLANNED INTERVENTIONS: 97164- PT Re-evaluation, 97750- Physical Performance Testing, 97110-Therapeutic exercises, 97530- Therapeutic activity, [id number]- Neuromuscular re-education, 97535- Self Care, [id number]- Manual therapy, [phone]- Gait training, [phone]- Orthotic Initial, Patient/Family education, Balance training, Stair training, Taping, Joint mobilization, Joint manipulation, Spinal manipulation, Spinal mobilization, Cryotherapy, and Moist heat  PLAN FOR NEXT SESSION: Functional mobility, strengthening, ambulation, etc.  Reaching forward over low surfaces to improve confidence in bathroom.  Review goals next session for 20th visit.   Augustin Mclean, LPTA/CLT; CBIS [phone]  4:59 PM, 07/23/24

## 2024-07-24 ENCOUNTER — Telehealth: Payer: Self-pay

## 2024-07-24 LAB — URINE CULTURE: Culture: NO GROWTH

## 2024-07-24 NOTE — Telephone Encounter (Signed)
 Returned pt call about how to take recent prescription of cephalexin . Clarified with provider frequency should be take 1 tab (500mg ) by mouth 4 times a day for 7 days. Informed pt. Pt verbalized understanding.

## 2024-07-27 ENCOUNTER — Ambulatory Visit (HOSPITAL_COMMUNITY): Payer: Self-pay

## 2024-07-28 ENCOUNTER — Encounter (HOSPITAL_COMMUNITY): Payer: Self-pay

## 2024-07-28 ENCOUNTER — Ambulatory Visit (HOSPITAL_COMMUNITY)

## 2024-07-28 DIAGNOSIS — G9520 Unspecified cord compression: Secondary | ICD-10-CM | POA: Diagnosis not present

## 2024-07-28 DIAGNOSIS — Z7409 Other reduced mobility: Secondary | ICD-10-CM

## 2024-07-28 DIAGNOSIS — R278 Other lack of coordination: Secondary | ICD-10-CM

## 2024-07-28 DIAGNOSIS — M6281 Muscle weakness (generalized): Secondary | ICD-10-CM

## 2024-07-28 DIAGNOSIS — R29818 Other symptoms and signs involving the nervous system: Secondary | ICD-10-CM

## 2024-07-28 NOTE — Therapy (Signed)
 OUTPATIENT PHYSICAL THERAPY NEURO TREATMENT  Progress Note Reporting Period 06/30/24 to 07/28/24  See note below for Objective Data and Assessment of Progress/Goals.     Patient Name: Angela Norman MRN: [id number] DOB:15-Apr-1956, 68 y.o., female Today's Date: 07/28/2024     END OF SESSION:  PT End of Session - 07/28/24 1458     Visit Number 20    Number of Visits 24    Date for Recertification  07/28/24    Authorization Type Medicare A & B    Progress Note Due on Visit 21    PT Start Time 1458    PT Stop Time 1538    PT Time Calculation (min) 40 min    Equipment Utilized During Treatment Gait belt    Activity Tolerance Patient tolerated treatment well    Behavior During Therapy WFL for tasks assessed/performed                Past Medical History:  Diagnosis Date   Anxiety    Arthritis    Basal cell carcinoma    Charcot's joint of foot    bilateral   Diabetes mellitus without complication (HCC)    Fibromyalgia    Neuropathy    Past Surgical History:  Procedure Laterality Date   ACHILLES TENDON REPAIR Left    bone spur Left    --left foot   BREAST SURGERY     benign breast bx--left--fatty tissue   CARPAL TUNNEL RELEASE Bilateral    COLONOSCOPY     COLONOSCOPY WITH PROPOFOL  N/A 09/06/2022   Procedure: COLONOSCOPY WITH PROPOFOL ;  Surgeon: Shaaron Lamar HERO, MD;  Location: [facility];  Service: Endoscopy;  Laterality: N/A;  10:30am, asa 2   DILATION AND CURETTAGE OF UTERUS  2011   for cervical polyp   FOOT SURGERY Left 03/2018   reconstructive surgery   POLYPECTOMY  09/06/2022   Procedure: POLYPECTOMY;  Surgeon: Shaaron Lamar HERO, MD;  Location: [facility];  Service: Endoscopy;;   Patient Active Problem List   Diagnosis Date Noted   Anxiety 06/28/2022   Arthritis 06/28/2022   Body fluid retention 06/28/2022   Hyperglycemia due to type 2 diabetes mellitus (HCC) 06/28/2022   Mixed hyperlipidemia 06/28/2022   Fibromyalgia 06/28/2022    Cough 10/25/2021   Musculoskeletal pain 05/21/2012   Stiffness of joint, not elsewhere classified, ankle and foot 08/13/2011   Difficulty walking 08/13/2011   Ankle weakness 08/13/2011   PCP: Sebastian Othel GAILS, FNP REFERRING PROVIDER: Edna Norris, MD ONSET DATE: November 2024  REFERRING DIAG: compression of spinal cord   THERAPY DIAG:  Other lack of coordination  Other symptoms and signs involving the nervous system  Compression of spinal cord (HCC)  Impaired functional mobility, balance, gait, and endurance  Muscle weakness (generalized)  Rationale for Evaluation and Treatment: Rehabilitation  SUBJECTIVE:  SUBJECTIVE STATEMENT: Pt states she feels therapy has helped her with ambulation and functional transfers. Pt states she is still spending most of her day at home in wheelchair as it is hard to be functional with RW for need with BUE support still needed with fear of falling. Pt states standing on one foot is still very challenging. Pt states she gets nervous on reassessment days. Pt would like to continue therapy with a new goal to be more comfortable with ambulation with quad cane.   Eval:  Pt reported all this started with UTI that caused fracture of neck and indicated as C4-C7 fusion. Pt had multiple bouts of acute and inpatient rehab stays due to complications. Significant BP issues throughout this time. Had stay in multiple facilities. Pt reported various changes in functional status, was requiring assist for ADLs and mobility since April 28th. Since then, pt has been home with Home Health services. Pt continues to report numbness in BUE and poor grip strength. Pt reports neuropathy in BLE. Prior to onset of November '24 was walking daily for 45-60 minutes.   Pt has CT scan for  nodule on Lung on 04/28/24.  Pt accompanied by: self  PERTINENT HISTORY:  Cervical Myelopathy with ACDF 4-7 in November 2024\ Left ankle surgery x 3  PAIN:  Are you having pain? No and chronic pain in left shoulder  PRECAUTIONS: None  RED FLAGS: Bowel or bladder incontinence: Yes: SCI   WEIGHT BEARING RESTRICTIONS: No  FALLS: Has patient fallen in last 6 months? Yes. Number of falls 1-2   PATIENT GOALS:  want to walk with AD well and with confidence and strengthening RUE.  OBJECTIVE:  Note: Objective measures were completed at Evaluation unless otherwise noted.  DIAGNOSTIC FINDINGS:   COGNITION: Overall cognitive status: Within functional limits for tasks assessed   SENSATION: Light touch: decreased light touch from lateral malleolus distally.   LOWER EXTREMITY ROM:     Active  Right Eval Left Eval  Hip flexion    Hip extension    Hip abduction    Hip adduction    Hip internal rotation    Hip external rotation    Knee flexion    Knee extension    Ankle dorsiflexion    Ankle plantarflexion    Ankle inversion    Ankle eversion     (Blank rows = not tested)  LOWER EXTREMITY MMT:    MMT Right Eval Left Eval Right 06/25/24 Left 06/25/24  Hip flexion      Hip extension      Hip abduction      Hip adduction      Hip internal rotation      Hip external rotation      Knee flexion      Knee extension 4- 4+ 4+ 5  Ankle dorsiflexion      Ankle plantarflexion      Ankle inversion      Ankle eversion      (Blank rows = not tested)   TRANSFERS: Sit to stand: Modified independence  Assistive device utilized: Environmental consultant - 2 wheeled     Stand to sit: Modified independence  Assistive device utilized: Environmental consultant - 2 wheeled     Chair to chair: SBA  Assistive device utilized: Environmental consultant - 2 wheeled       GAIT: Findings: Gait Characteristics: step through pattern, decreased arm swing- Right, decreased arm swing- Left, decreased step length- Right, decreased step length-  Left, decreased hip/knee flexion- Right, decreased  hip/knee flexion- Left, Right foot flat, Left foot flat, and narrow BOS, Distance walked: 28ft, Assistive device utilized:Walker - 2 wheeled, Level of assistance: Modified independence, and Comments:    FUNCTIONAL TESTS:  TUG: 25.71 seconds  DGI: 05/13/24: 8 / 24  L SLS: <0.1 seconds  R SLS: <0.1 seconds  07/28/24: SLS L: 2.06 seconds R: 2.23   Norms: 18-39  F: 43.5 seconds  M: 43.2 seconds 40-49  F: 40.4 seconds  M: 40.1 seconds 50-59  F: 36 seconds  M: 38.1 seconds 60-69  F: 25.1 seconds  M: 28.7 seconds 70-79  F: 11.3 seconds  M: 18.3 seconds  PATIENT SURVEYS:  ABC Scale:950 / 1600 = 59.4 % 07/28/24: 1070 / 1600 = 66.9 %                                                                                                                             TREATMENT DATE:  07/28/2024  Progress notes: goals tracked, SLS assessed, DGI performed, pt educated on the updated goals, role of PT, prognosis, and importance of updated HEP compliance.  DGI FORM: Input Gait level surface - walking at normal speed 2 Change in gait speed 2 Gait with horizontal head turns 2 Gait with vertical head turns 2 Gait and pivot turn 1 Step over obstacle 2 Step around obstacles 2 Steps 2  Result Dynamic Gait Index 15 Interpretation: This result is consistent with an increased fall risk. Neuromuscular Re-education: -SLS trials, 3 reps bilaterally, pt cued for sequencing to get in positioning Therapeutic Activity:  -Walking bout with quad cane and min assist, 20 feet, pt cued for UE placement and sequencing   07/23/24: - STS holding spiral tidal tank 8 reps then report of Lt shoulder pain, given blue ball without weight for 12 more reps - Dead lift incorporating low reaching activities 2x 10 - Cone rotation standing NBOS on foam - 6in lunge with 1 UE support 15x - Squat front of chair 20x - Nustep [place] UE/LE seat 8 L7 x 5'  07/21/24: -  Nustep seat 8 x 5' level 7, pt avgs 70 SPM; [place] UE/LE - STS 15x no UE A - Reports slight light headedness, in a fog - Vitals checked Lt arm in seated position:  134/72 mmHg HR 76 - 6in step up and over 1 UE support required 10x  - Squat no HHA front of chair 10x - 6in lunge with 2 UE support 15x - Gait training with SBQC inside // bars cueing for sequence 3RT  PATIENT EDUCATION: Education details: PT Evaluation, findings, prognosis, frequency, attendance policy. Person educated: Patient Education method: Explanation and Demonstration Education comprehension: verbalized understanding  HOME EXERCISE PROGRAM: Access Code: [email or web address] URL: [email or web address] Date: 05/14/2024 Prepared by: Augustin Mclean  Exercises - Sit to Stand  - 1 x daily - 7 x weekly - 3 sets - 10 reps - Standing March with Counter Support  - 1 x daily - 7 x weekly -  3 sets - 10 reps - Standing Hip Abduction with Counter Support  - 1 x daily - 7 x weekly - 3 sets - 10 reps - Side Stepping with Resistance at Thighs and Counter Support  - 1 x daily - 7 x weekly - 3 sets - 10 reps  06/30/24: - Squat with Chair and Counter Support  - 2 x daily - 7 x weekly - 1 sets - 10 reps - Standing Tandem Balance with Counter Support  - 1 x daily - 7 x weekly - 1 sets - 3 reps - 30 hold  GOALS: Goals reviewed with patient? No  SHORT TERM GOALS: Target date: 05/25/2024  Pt will be independent with HEP in order to demonstrate participation in Physical Therapy POC.  Baseline: Goal status: MET  2.  Pt will improve ABC score by 15% in order to demonstrate improved pain with functional goals and outcomes. Baseline: 07/28/24: 1070 / 1600 = 66.9 % Goal status: IN PROGRESS  LONG TERM GOALS: Target date: 06/22/2024  Pt will increase DGI score by > MCID in order to demonstrate improved functional safety and balance skills in ADL/mobility.   Baseline: see objective. 06/25/24  14/24 07/28/24: 15/24, (MCID of 6  points) Goal status: MET  2.  Pt will improve TUG by least 3 seconds in order to demonstrate improved functional mobility capacity in community setting.  Baseline: see objective. 06/25/24 21.38 sec with RW Goal status: MET  3.  Pt will improve ABC score by 30% in order to demonstrate improved pain with functional goals and outcomes. Baseline: see objective.  06/25/24 58.8%;  07/28/24: 1070 / 1600 = 66.9 % Goal status: IN PROGRESS  4.  Pt will improve single leg balance by at least 3 seconds bilaterally in order to demonstrate improved capacity, safety, and overall quality of movement to optimize function.  Baseline: see objective. 06/25/24 2 on left and 1 on right  07/28/24: L: 2.06 seconds R: 2.23 Goal status: IN PROGRESS  5. Pt will demonstrate ability to ambulate 50 feet with quad cane and SBA in less than 1 minute for increased independence with gait training and improved community ambulation.  Baseline: 07/28/24:  pt able to walk 20 feet with min assist and quad cane Goal status: IN PROGRESS  ASSESSMENT:  CLINICAL IMPRESSION: Patient continues to demonstrate decreased LE strength/mobility, decreased gait quality and balance. Patient also demonstrates ability to meet 3/7 goals this date. Added ambulation goal with LRAD as pt states she would like to be able to become more independent with gait training and not use the walker. Patient able to continue dynamic balance and core activation exercises today with ambulation trial with quad cane and SLS trials, good performance with verbal cueing. Patient would continue to benefit from additional 4 weeks of skilled physical therapy for increased endurance with ambulation, increased LE strength, and improved balance for improved quality of life, improved independence with gait training and continued progress towards therapy goals.   Eval:  Patient is a 68y.o. female who was seen today for physical therapy evaluation and treatment for 'compression  of spinal cord.'  Patient with history of myelopathy C5 spinal fracture, with A C4 through C7 cervical fusion.  Patient presents with significant functional mobility deficits, safety concerns, increased falls risk, reduced ambulation capacity at due to muscle weakness, decreased activity tolerance, balance deficits in setting of previous cervical myelopathy pt will benefit from skilled Physical Therapy services to address deficits/limitations in order to improve functional and QOL.  OBJECTIVE IMPAIRMENTS: Abnormal gait, decreased activity tolerance, decreased balance, decreased mobility, difficulty walking, decreased strength, and postural dysfunction.   ACTIVITY LIMITATIONS: carrying, lifting, bending, standing, squatting, stairs, transfers, bed mobility, and locomotion level  PARTICIPATION LIMITATIONS: meal prep, cleaning, laundry, community activity, occupation, and yard work  PERSONAL FACTORS: Age are also affecting patient's functional outcome.   REHAB POTENTIAL: Excellent  CLINICAL DECISION MAKING: Stable/uncomplicated  EVALUATION COMPLEXITY: Low  PLAN:  PT FREQUENCY: 2x/week  PT DURATION: 4 weeks  PLANNED INTERVENTIONS: 97164- PT Re-evaluation, 97750- Physical Performance Testing, 97110-Therapeutic exercises, 97530- Therapeutic activity, [id number]- Neuromuscular re-education, 97535- Self Care, [id number]- Manual therapy, [phone]- Gait training, [phone]- Orthotic Initial, Patient/Family education, Balance training, Stair training, Taping, Joint mobilization, Joint manipulation, Spinal manipulation, Spinal mobilization, Cryotherapy, and Moist heat  PLAN FOR NEXT SESSION: Functional mobility, strengthening, ambulation, etc.  Reaching forward over low surfaces to improve confidence in bathroom. Plan to extend for 4 weeks, 2 visits per week.   Lang Ada, PT, DPT [facility] Office: [phone] 5:56 PM, 07/28/24

## 2024-07-29 ENCOUNTER — Ambulatory Visit (HOSPITAL_COMMUNITY): Admitting: Psychiatry

## 2024-07-29 DIAGNOSIS — F411 Generalized anxiety disorder: Secondary | ICD-10-CM

## 2024-07-29 NOTE — Progress Notes (Addendum)
 IN- PERSON  THERAPIST PROGRESS NOTE  Session Time:  Wednesday 07/29/2024 2:10 PM - 3:03 PM       Participation Level: Active  Behavioral Response: CasualAlertAnxious  Type of Therapy: Individual Therapy  Treatment Goals addressed: Establish therapeutic alliance, learn and implement relaxation techniques to cope with stress and anxiety, identify and verbalize feelings associated with loss and transitions ProgressTowards Goals: Formal treatment plan will be developed next session  Interventions: CBT and Supportive  Summary: Angela Norman is a 68 y.o. female who is a returning pt to this clinician and last was seen about 9 months ago.  Pt reports no psychiatric hospitalizations. She reports no previous involvement in therapy.  Patient reports needing to talk to someone as she has experienced several transitions in the past several months.  She incurred a spinal cord injury on September 05, 2023  As a result, she had surgery and participated in rehab for several months.  She reports becoming more anxious since her return home in April 2025.  She is experiencing loneliness.  She also expresses worry about her husband as he went into a deep depression when she was injured.  He just completed TMS and is slightly better.  Patient reports irritability, ruminating about the past, restlessness, worry about her family and the future.   Patient last was seen 2-1/2 months ago for the assessment appointment.  She reports continued stress and anxiety along with continued loneliness.  She continues to worry about her husband who has made some improvement regarding depressive symptoms but still is having issues.  He now is retired.  Patient reports trying to adjust to his retirement as well as still trying to adjust to her changed functioning.  She no longer drives and is dependent upon husband to transport her to appointments.  She also expresses sadness, disappointment, and frustration she has not received  contact or visits from her friends as she had expected.  She also reports increased worry about her health as well as aging.  She worries about the future.  Suicidal/Homicidal: Nowithout intent/plan  Therapist Response: Reviewed symptoms, discussed stressors, facilitated expression of thoughts and feelings, validated feelings, gather more information from patient, normalized feelings related to grief and loss regarding changed functioning, began to explore possible goals for treatment, discussed rationale for and develop plan with patient to complete therapy goals worksheet in preparation for next session, discussed rationale for develop plan with patient to resume practicing deep breathing to manage stress and anxiety, began to discuss possible ways to increase social involvement , encouraged patient to follow through with plan to contact her former book club diagnosis: Generalized anxiety disorder  Collaboration of Care: Other none needed at this session  Patient/Guardian was advised Release of Information must be obtained prior to any record release in order to collaborate their care with an outside provider. Patient/Guardian was advised if they have not already done so to contact the registration department to sign all necessary forms in order for us  to release information regarding their care.   Consent: Patient/Guardian gives verbal consent for treatment and assignment of benefits for services provided during this visit. Patient/Guardian expressed understanding and agreed to proceed.   Winton FORBES Rubinstein, LCSW 07/29/2024

## 2024-07-30 ENCOUNTER — Encounter (HOSPITAL_COMMUNITY): Payer: Self-pay | Admitting: Occupational Therapy

## 2024-07-30 ENCOUNTER — Ambulatory Visit (HOSPITAL_COMMUNITY): Admitting: Occupational Therapy

## 2024-07-30 DIAGNOSIS — G9520 Unspecified cord compression: Secondary | ICD-10-CM | POA: Diagnosis not present

## 2024-07-30 DIAGNOSIS — R278 Other lack of coordination: Secondary | ICD-10-CM

## 2024-07-30 DIAGNOSIS — R29818 Other symptoms and signs involving the nervous system: Secondary | ICD-10-CM

## 2024-07-30 NOTE — Therapy (Signed)
 OUTPATIENT OCCUPATIONAL THERAPY NEURO TREATMENT NOTE   Patient Name: Angela Norman MRN: [id number] DOB:August 12, 1956, 68 y.o., female Today's Date: 07/30/2024    PCP: Shona Norleen PEDLAR, MD REFERRING PROVIDER: Hillman Amour, MD        END OF SESSION:  OT End of Session - 07/30/24 1511     Visit Number 19    Number of Visits 24    Date for Recertification  08/14/24    Authorization Type Medicare Part A and B    Progress Note Due on Visit 26    OT Start Time 1430    OT Stop Time 1509    OT Time Calculation (min) 39 min    Activity Tolerance Patient tolerated treatment well    Behavior During Therapy WFL for tasks assessed/performed             Past Medical History:  Diagnosis Date   Anxiety    Arthritis    Basal cell carcinoma    Charcot's joint of foot    bilateral   Diabetes mellitus without complication (HCC)    Fibromyalgia    Neuropathy    Past Surgical History:  Procedure Laterality Date   ACHILLES TENDON REPAIR Left    bone spur Left    --left foot   BREAST SURGERY     benign breast bx--left--fatty tissue   CARPAL TUNNEL RELEASE Bilateral    COLONOSCOPY     COLONOSCOPY WITH PROPOFOL  N/A 09/06/2022   Procedure: COLONOSCOPY WITH PROPOFOL ;  Surgeon: Shaaron Lamar HERO, MD;  Location: [facility];  Service: Endoscopy;  Laterality: N/A;  10:30am, asa 2   DILATION AND CURETTAGE OF UTERUS  2011   for cervical polyp   FOOT SURGERY Left 03/2018   reconstructive surgery   POLYPECTOMY  09/06/2022   Procedure: POLYPECTOMY;  Surgeon: Shaaron Lamar HERO, MD;  Location: [facility];  Service: Endoscopy;;   Patient Active Problem List   Diagnosis Date Noted   Anxiety 06/28/2022   Arthritis 06/28/2022   Body fluid retention 06/28/2022   Hyperglycemia due to type 2 diabetes mellitus (HCC) 06/28/2022   Mixed hyperlipidemia 06/28/2022   Fibromyalgia 06/28/2022   Cough 10/25/2021   Musculoskeletal pain 05/21/2012   Stiffness of joint, not elsewhere  classified, ankle and foot 08/13/2011   Difficulty walking 08/13/2011   Ankle weakness 08/13/2011    ONSET DATE: 08/2023  REFERRING DIAG:  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 Pt had multiple bouts of acute and inpatient rehab stays due to complications. Significant BP issues throughout this time. Had stay in multiple facilities. Pt reported various changes in functional status, was requiring assist for ADLs and mobility since April 28th. Since then, pt has been home with Home Health services. Pt continues to report numbness in BUE and poor grip strength. Pt reports neuropathy in BLE. Prior to onset of November '24 was walking daily for 45-60 minutes.   PRECAUTIONS: Fall  WEIGHT BEARING RESTRICTIONS: No  PAIN:  Are you having pain? Yes: NPRS scale: 4/10 Pain location: left shoulder and arm Pain description: sore to the touch Aggravating factors: overuse Relieving factors: pain cream  FALLS: Has patient fallen in last 6 months? No  LIVING ENVIRONMENT: Lives with: lives with their spouse Lives in: House/apartment Stairs: Pt lives in the basement with no stairs Has following equipment at home: Vannie - 2 wheeled and shower chair  PLOF: Independent  PATIENT GOALS: To improve mobility and strengthening  OBJECTIVE:  Note: Objective measures were completed at Evaluation unless otherwise noted.  HAND DOMINANCE: Right  ADLs: Overall ADLs: Pt has difficulty with all small object manipulation, including but not limited to buttons, shoe laces, and zippers. Additionally she has max difficulty  with cooking and cleaning due to weakness in both hands and wrists.   MOBILITY STATUS: Hx of falls, difficulty with turns, and difficulty carrying objections with ambulation  POSTURE COMMENTS:  rounded shoulders Sitting balance: Moves/returns truncal midpoint 1-2 inches in multiple planes  ACTIVITY TOLERANCE: Activity tolerance: Fair tolerance - fatigues quicker than normal  FUNCTIONAL OUTCOME MEASURES: Quick Dash: 47.73 07/16/24: 20.45  UPPER EXTREMITY ROM:    All ROM is [facility]  UPPER EXTREMITY MMT:     MMT Right eval Left eval Right 07/16/24 Left 07/16/24  Shoulder flexion 5/5 5/5 5/5 5/5  Shoulder abduction 5/5 5/5 5/5 4/5  Shoulder internal rotation 5/5 5/5 5/5 5/5  Shoulder external rotation 5/5 5/5 4+/5 4+/5  Elbow flexion 5/5 5/5 5/5 5/5  Elbow extension 5/5 5/5 4+/5 4+/5  Wrist flexion 4/5 4+/5 4+/5 5/5  Wrist extension 4-/5 4/5 5/5 5/5  Wrist ulnar deviation 4-/5 4/5 4/5 4+/5  Wrist radial deviation 4-/5 4-/5 5/5 5/5  Wrist pronation 4/5 4+/5 5/5 5/5  Wrist supination 4/5 4/5 4+/5 4+/5  (Blank rows = not tested)  HAND FUNCTION: Grip strength: Right: 20 lbs; Left: 27 lbs, Lateral pinch: Right: 5 lbs, Left: 9 lbs, and 3 point pinch: Right: 5 lbs, Left: 4 lbs 07/16/24: Grip strength: Right: 31 lbs; Left: 35 lbs, Lateral pinch: Right: 7 lbs, Left: 8 lbs, and 3 point pinch: Right: 7 lbs, Left: 9 lbs  COORDINATION: 06/30/24-9 Hole Peg test: Right: 33.28 sec; Left: 38.26 sec 07/16/24- 9 Hole Peg test: Right: 52.15 sec; Left: 49.88 sec  SENSATION: Pt has full numbness along median nerve of R hand as well as dulled sensation and tingling in BUE up to elbows.   EDEMA: No swelling noted  OBSERVATIONS: adducted thumbs and 80% grip                                                                                                                             TREATMENT DATE:  07/30/24 -Myofascial release to left anterior shoulder to decrease pain and fascial restrictions and  increase functional use of LUE during ADLs -Hand gripper, BUE: Right-large beads with gripper vertical at 29#; Left-large and medium beads gripper horizontal at 29# and 25# -Pinch task: pt using red clothespin and 3 point pinch to grasp and stack 4 towers of 5 sponges with left hand; switching to right hand to remove and replace  into bucket. -Therapy ball strengthening: blue kickball-chest press, overhead press, flexion, circles each direction, 10 reps  07/23/24 -Myofascial release to left anterior shoulder to decrease pain and fascial restrictions and increase functional use of LUE during ADLs -Hand gripper, BUE: Right-large and medium beads with gripper vertical at 25#; Left-large and medium beads gripper horizontal at 25# -AA/ROM: flexion, 5 reps -Table building: Pt working on building small table incorporating pinch strength, bilateral coordination, and manual dexterity into tasks. Pt using left hand to stabilize and hold pieces, right hand for manipulating and placement of pieces.     07/21/24 -gripper: 22# and 25# BUE large and medium beads -Pinch Tree: 1# wrist weights -Flex bar: teal - wrist flexion/extension, supination/pronation, ulnar/radial deviation, x10 -coins: picking up and holding 10 coins, placing them in piggy bank.    PATIENT EDUCATION: Education details: Continue HEP Person educated: Patient Education method: Programmer, multimedia, Demonstration, and Handouts Education comprehension: verbalized understanding and returned demonstration  HOME EXERCISE PROGRAM: 8/1: Wrist ROM and Digit ROM 8/8: Shoulder A/ROM 8/20: Scapular Strengthening 9/4: Coin work 9/18: theraband wrist strengthening-red   GOALS: Goals reviewed with patient? Yes  SHORT TERM GOALS: Target date: 06/19/24  Pt will be provided and educated on a comprehensive HEP for BUE mobility in order to complete ADL's and IADL's independently.   Goal status: IN PROGRESS  2.  Pt will improve BUE strength to 5/5 in  order to lift and carry items during cooking and cleaning tasks.   Goal status: IN PROGRESS  3.  Pt will improve BUE grip by 15# and pinch by 3# in order to grasp and hold pots and pans.  Goal status: IN PROGRESS  4.  Pt will improve BUE coordination by completing 9 hole peg test in 45 or less in order to manipulate and complete fine motor tasks.   Goal status: MET   ASSESSMENT:  CLINICAL IMPRESSION: Continued with bilateral grip and pinch tasks, as well as shoulder strengthening to improve functional use of BUE during ADLs. Pt with more right hand weakness versus left hand weakness today, trialed increasing the gripper resistance, was able to complete for large beads but not medium sized beads. Rest breaks provided as needed, pt reports some discomfort in the left shoulder during therapy ball strengthening. Verbal cuing for form and technique during tasks.    PERFORMANCE DEFICITS: in functional skills including ADLs, IADLs, coordination, dexterity, sensation, ROM, strength, pain, fascial restrictions, Fine motor control, Gross motor control, body mechanics, and UE functional use.    PLAN:  OT FREQUENCY: 2x/week  OT DURATION: 4 weeks  PLANNED INTERVENTIONS: 97168 OT Re-evaluation, 97535 self care/ADL training, 02889 therapeutic exercise, 97530 therapeutic activity, 97112 neuromuscular re-education, 97140 manual therapy, 97035 ultrasound, 97018 paraffin, 02989 moist heat, 97032 electrical stimulation (manual), passive range of motion, functional mobility training, energy conservation, coping strategies training, patient/family education, and DME and/or AE instructions  CONSULTED AND AGREED WITH PLAN OF CARE: Patient  PLAN FOR NEXT SESSION: Wrist and Digit ROM, strengthening, coordination tasks   Sonny Cory, OTR/L  [phone] 07/30/2024, 3:13 PM

## 2024-08-04 ENCOUNTER — Ambulatory Visit (HOSPITAL_COMMUNITY): Admitting: Occupational Therapy

## 2024-08-04 ENCOUNTER — Encounter (HOSPITAL_COMMUNITY): Payer: Self-pay | Admitting: Occupational Therapy

## 2024-08-04 DIAGNOSIS — R29818 Other symptoms and signs involving the nervous system: Secondary | ICD-10-CM

## 2024-08-04 DIAGNOSIS — G9520 Unspecified cord compression: Secondary | ICD-10-CM | POA: Diagnosis not present

## 2024-08-04 DIAGNOSIS — R278 Other lack of coordination: Secondary | ICD-10-CM

## 2024-08-04 NOTE — Patient Instructions (Signed)

## 2024-08-04 NOTE — Therapy (Unsigned)
 OUTPATIENT OCCUPATIONAL THERAPY NEURO TREATMENT NOTE   Patient Name: Angela Norman MRN: [id number] DOB:[birth year], 68 y.o., female Today's Date: 08/05/2024    PCP: Shona Norleen PEDLAR, MD REFERRING PROVIDER: Hillman Amour, MD   END OF SESSION:  OT End of Session - 08/04/24 1219     Visit Number 20    Number of Visits 24    Date for Recertification  08/14/24    Authorization Type Medicare Part A and B    Progress Note Due on Visit 26    OT Start Time 1137    OT Stop Time 1219    OT Time Calculation (min) 42 min    Activity Tolerance Patient tolerated treatment well    Behavior During Therapy WFL for tasks assessed/performed          Past Medical History:  Diagnosis Date   Anxiety    Arthritis    Basal cell carcinoma    Charcot's joint of foot    bilateral   Diabetes mellitus without complication (HCC)    Fibromyalgia    Neuropathy    Past Surgical History:  Procedure Laterality Date   ACHILLES TENDON REPAIR Left    bone spur Left    --left foot   BREAST SURGERY     benign breast bx--left--fatty tissue   CARPAL TUNNEL RELEASE Bilateral    COLONOSCOPY     COLONOSCOPY WITH PROPOFOL  N/A 09/06/2022   Procedure: COLONOSCOPY WITH PROPOFOL ;  Surgeon: Shaaron Lamar HERO, MD;  Location: [facility];  Service: Endoscopy;  Laterality: N/A;  10:30am, asa 2   DILATION AND CURETTAGE OF UTERUS  2011   for cervical polyp   FOOT SURGERY Left 03/2018   reconstructive surgery   POLYPECTOMY  09/06/2022   Procedure: POLYPECTOMY;  Surgeon: Shaaron Lamar HERO, MD;  Location: [facility];  Service: Endoscopy;;   Patient Active Problem List   Diagnosis Date Noted   Anxiety 06/28/2022   Arthritis 06/28/2022   Body fluid retention 06/28/2022   Hyperglycemia due to type 2 diabetes mellitus (HCC) 06/28/2022   Mixed hyperlipidemia 06/28/2022   Fibromyalgia 06/28/2022   Cough 10/25/2021   Musculoskeletal pain 05/21/2012   Stiffness of joint, not elsewhere classified, ankle and  foot 08/13/2011   Difficulty walking 08/13/2011   Ankle weakness 08/13/2011    ONSET DATE: 08/2023  REFERRING DIAG:  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  PERTINENT HISTORY: Pt reported all this started with UTI that caused fracture f neck and indicated as C4-C7 fusion. Pt had multiple bouts of acute and inpatient rehab stays due to complications. Significant BP issues throughout this time. Had stay in multiple facilities. Pt reported various changes in functional status, was requiring assist for ADLs and mobility since April 28th. Since then, pt has been home with Home Health services. Pt continues to report numbness in BUE and poor grip strength. Pt reports neuropathy in BLE. Prior to onset of November '24 was walking daily for 45-60 minutes.   PRECAUTIONS: Fall  WEIGHT BEARING RESTRICTIONS: No  PAIN:  Are you having pain? Yes: NPRS scale: 3/10 Pain location: left shoulder and arm Pain description: sore to the touch Aggravating factors: overuse Relieving factors: pain cream  FALLS: Has patient fallen in last 6  months? No  LIVING ENVIRONMENT: Lives with: lives with their spouse Lives in: House/apartment Stairs: Pt lives in the basement with no stairs Has following equipment at home: Vannie - 2 wheeled and shower chair  PLOF: Independent  PATIENT GOALS: To improve mobility and strengthening  OBJECTIVE:  Note: Objective measures were completed at Evaluation unless otherwise noted.  HAND DOMINANCE: Right  ADLs: Overall ADLs: Pt has difficulty with all small object manipulation, including but not limited to buttons, shoe laces, and zippers. Additionally she has max difficulty with cooking and  cleaning due to weakness in both hands and wrists.   MOBILITY STATUS: Hx of falls, difficulty with turns, and difficulty carrying objections with ambulation  POSTURE COMMENTS:  rounded shoulders Sitting balance: Moves/returns truncal midpoint 1-2 inches in multiple planes  ACTIVITY TOLERANCE: Activity tolerance: Fair tolerance - fatigues quicker than normal  FUNCTIONAL OUTCOME MEASURES: Quick Dash: 47.73 07/16/24: 20.45  UPPER EXTREMITY ROM:    All ROM is [facility]  UPPER EXTREMITY MMT:     MMT Right eval Left eval Right 07/16/24 Left 07/16/24  Shoulder flexion 5/5 5/5 5/5 5/5  Shoulder abduction 5/5 5/5 5/5 4/5  Shoulder internal rotation 5/5 5/5 5/5 5/5  Shoulder external rotation 5/5 5/5 4+/5 4+/5  Elbow flexion 5/5 5/5 5/5 5/5  Elbow extension 5/5 5/5 4+/5 4+/5  Wrist flexion 4/5 4+/5 4+/5 5/5  Wrist extension 4-/5 4/5 5/5 5/5  Wrist ulnar deviation 4-/5 4/5 4/5 4+/5  Wrist radial deviation 4-/5 4-/5 5/5 5/5  Wrist pronation 4/5 4+/5 5/5 5/5  Wrist supination 4/5 4/5 4+/5 4+/5  (Blank rows = not tested)  HAND FUNCTION: Grip strength: Right: 20 lbs; Left: 27 lbs, Lateral pinch: Right: 5 lbs, Left: 9 lbs, and 3 point pinch: Right: 5 lbs, Left: 4 lbs 07/16/24: Grip strength: Right: 31 lbs; Left: 35 lbs, Lateral pinch: Right: 7 lbs, Left: 8 lbs, and 3 point pinch: Right: 7 lbs, Left: 9 lbs  COORDINATION: 06/30/24-9 Hole Peg test: Right: 33.28 sec; Left: 38.26 sec 07/16/24- 9 Hole Peg test: Right: 52.15 sec; Left: 49.88 sec  SENSATION: Pt has full numbness along median nerve of R hand as well as dulled sensation and tingling in BUE up to elbows.   EDEMA: No swelling noted  OBSERVATIONS: adducted thumbs and 80% grip                                                                                                                             TREATMENT DATE:  08/04/24 -Cervical ROM: flexion/extension, rotations, lateral leans, chin tucks, x10 -Cervical Stretches: using towel,  rotations, lateral leans, chin tucks, 4x15 holds -PNF Strengthening: red band, chest pulls, overhead pulls, PNF up, PNF down, er pulls, x12 -Grooved peg board  07/30/24 -Myofascial release to left anterior shoulder to decrease pain and fascial restrictions and increase functional use of LUE during ADLs -Hand gripper, BUE: Right-large beads with gripper vertical at 29#; Left-large and medium beads gripper horizontal at  29# and 25# -Pinch task: pt using red clothespin and 3 point pinch to grasp and stack 4 towers of 5 sponges with left hand; switching to right hand to remove and replace into bucket. -Therapy ball strengthening: blue kickball-chest press, overhead press, flexion, circles each direction, 10 reps  07/23/24 -Myofascial release to left anterior shoulder to decrease pain and fascial restrictions and increase functional use of LUE during ADLs -Hand gripper, BUE: Right-large and medium beads with gripper vertical at 25#; Left-large and medium beads gripper horizontal at 25# -AA/ROM: flexion, 5 reps -Table building: Pt working on building small table incorporating pinch strength, bilateral coordination, and manual dexterity into tasks. Pt using left hand to stabilize and hold pieces, right hand for manipulating and placement of pieces.     PATIENT EDUCATION: Education details: Continue HEP Person educated: Patient Education method: Programmer, multimedia, Demonstration, and Handouts Education comprehension: verbalized understanding and returned demonstration  HOME EXERCISE PROGRAM: 8/1: Wrist ROM and Digit ROM 8/8: Shoulder A/ROM 8/20: Scapular Strengthening 9/4: Coin work 9/18: theraband wrist strengthening-red 10/14: PNF Strengthening    GOALS: Goals reviewed with patient? Yes  SHORT TERM GOALS: Target date: 06/19/24  Pt will be provided and educated on a comprehensive HEP for BUE mobility in order to complete ADL's and IADL's independently.   Goal status: IN PROGRESS  2.  Pt will  improve BUE strength to 5/5 in order to lift and carry items during cooking and cleaning tasks.   Goal status: IN PROGRESS  3.  Pt will improve BUE grip by 15# and pinch by 3# in order to grasp and hold pots and pans.  Goal status: IN PROGRESS  4.  Pt will improve BUE coordination by completing 9 hole peg test in 45 or less in order to manipulate and complete fine motor tasks.   Goal status: MET   ASSESSMENT:  CLINICAL IMPRESSION: Pt feels improved pain in her shoulders this session, however has tension continually in her trapezius. OT helped her address the tension with cervical ROM and stretches. Strength continues to be limited in both shoulders, which OT provided strengthening along PNF patterns for working towards functional mobility. Verbal and tactile cuing for positioning and technique throughout session.    PERFORMANCE DEFICITS: in functional skills including ADLs, IADLs, coordination, dexterity, sensation, ROM, strength, pain, fascial restrictions, Fine motor control, Gross motor control, body mechanics, and UE functional use.    PLAN:  OT FREQUENCY: 2x/week  OT DURATION: 4 weeks  PLANNED INTERVENTIONS: 97168 OT Re-evaluation, 97535 self care/ADL training, 02889 therapeutic exercise, 97530 therapeutic activity, 97112 neuromuscular re-education, 97140 manual therapy, 97035 ultrasound, 97018 paraffin, 02989 moist heat, 97032 electrical stimulation (manual), passive range of motion, functional mobility training, energy conservation, coping strategies training, patient/family education, and DME and/or AE instructions  CONSULTED AND AGREED WITH PLAN OF CARE: Patient  PLAN FOR NEXT SESSION: Wrist and Digit ROM, strengthening, coordination tasks   Valentin Nightingale, OTR/L [phone] 08/05/2024, 12:31 PM

## 2024-08-06 ENCOUNTER — Ambulatory Visit (HOSPITAL_COMMUNITY): Admitting: Occupational Therapy

## 2024-08-06 ENCOUNTER — Ambulatory Visit (HOSPITAL_COMMUNITY)

## 2024-08-06 ENCOUNTER — Encounter (HOSPITAL_COMMUNITY): Payer: Self-pay | Admitting: Occupational Therapy

## 2024-08-06 ENCOUNTER — Encounter (HOSPITAL_COMMUNITY): Admitting: Occupational Therapy

## 2024-08-06 DIAGNOSIS — M6281 Muscle weakness (generalized): Secondary | ICD-10-CM

## 2024-08-06 DIAGNOSIS — R29818 Other symptoms and signs involving the nervous system: Secondary | ICD-10-CM

## 2024-08-06 DIAGNOSIS — G9520 Unspecified cord compression: Secondary | ICD-10-CM

## 2024-08-06 DIAGNOSIS — R278 Other lack of coordination: Secondary | ICD-10-CM

## 2024-08-06 DIAGNOSIS — Z7409 Other reduced mobility: Secondary | ICD-10-CM

## 2024-08-06 NOTE — Therapy (Signed)
 OUTPATIENT PHYSICAL THERAPY NEURO TREATMENT   Patient Name: Angela Norman MRN: [id number] DOB:[birth year], 68 y.o., female Today's Date: 08/06/2024     END OF SESSION:  PT End of Session - 08/06/24 1441     Visit Number 21    Number of Visits 28    Date for Recertification  08/28/24    Authorization Type Medicare A & B    Progress Note Due on Visit 21    PT Start Time 1441    PT Stop Time 1521    PT Time Calculation (min) 40 min    Equipment Utilized During Treatment Gait belt    Activity Tolerance Patient tolerated treatment well    Behavior During Therapy WFL for tasks assessed/performed                Past Medical History:  Diagnosis Date   Anxiety    Arthritis    Basal cell carcinoma    Charcot's joint of foot    bilateral   Diabetes mellitus without complication (HCC)    Fibromyalgia    Neuropathy    Past Surgical History:  Procedure Laterality Date   ACHILLES TENDON REPAIR Left    bone spur Left    --left foot   BREAST SURGERY     benign breast bx--left--fatty tissue   CARPAL TUNNEL RELEASE Bilateral    COLONOSCOPY     COLONOSCOPY WITH PROPOFOL  N/A 09/06/2022   Procedure: COLONOSCOPY WITH PROPOFOL ;  Surgeon: Shaaron Lamar HERO, MD;  [facility];  Service: Endoscopy;  Laterality: N/A;  10:30am, asa 2   DILATION AND CURETTAGE OF UTERUS  2011   for cervical polyp   FOOT SURGERY Left 03/2018   reconstructive surgery   POLYPECTOMY  09/06/2022   Procedure: POLYPECTOMY;  Surgeon: Shaaron Lamar HERO, MD;  Location: [facility];  Service: Endoscopy;;   Patient Active Problem List   Diagnosis Date Noted   Anxiety 06/28/2022   Arthritis 06/28/2022   Body fluid retention 06/28/2022   Hyperglycemia due to type 2 diabetes mellitus (HCC) 06/28/2022   Mixed hyperlipidemia 06/28/2022   Fibromyalgia 06/28/2022   Cough 10/25/2021   Musculoskeletal pain 05/21/2012   Stiffness of joint, not elsewhere classified, ankle and foot 08/13/2011    Difficulty walking 08/13/2011   Ankle weakness 08/13/2011   PCP: Sebastian Othel GAILS, FNP REFERRING PROVIDER: Edna Norris, MD ONSET DATE: November 2024  REFERRING DIAG: compression of spinal cord   THERAPY DIAG:  Other lack of coordination  Other symptoms and signs involving the nervous system  Compression of spinal cord (HCC)  Impaired functional mobility, balance, gait, and endurance  Muscle weakness (generalized)  Rationale for Evaluation and Treatment: Rehabilitation  SUBJECTIVE:  SUBJECTIVE STATEMENT: Feels balance is her biggest challenge as far as physical therapy is concerned.  Her left foot is not flat after reconstructive surgery.  Patient states she tries not lean on her walker so much but this makes the walker more wobbly  Eval:  Pt reported all this started with UTI that caused fracture of neck and indicated as C4-C7 fusion. Pt had multiple bouts of acute and inpatient rehab stays due to complications. Significant BP issues throughout this time. Had stay in multiple facilities. Pt reported various changes in functional status, was requiring assist for ADLs and mobility since April 28th. Since then, pt has been home with Home Health services. Pt continues to report numbness in BUE and poor grip strength. Pt reports neuropathy in BLE. Prior to onset of November '24 was walking daily for 45-60 minutes.   Pt has CT scan for nodule on Lung on 04/28/24.  Pt accompanied by: self  PERTINENT HISTORY:  Cervical Myelopathy with ACDF 4-7 in November 2024\ Left ankle surgery x 3  PAIN:  Are you having pain? No and chronic pain in left shoulder  PRECAUTIONS: None  RED FLAGS: Bowel or bladder incontinence: Yes: SCI   WEIGHT BEARING RESTRICTIONS: No  FALLS: Has patient fallen in  last 6 months? Yes. Number of falls 1-2   PATIENT GOALS:  want to walk with AD well and with confidence and strengthening RUE.  OBJECTIVE:  Note: Objective measures were completed at Evaluation unless otherwise noted.  DIAGNOSTIC FINDINGS:   COGNITION: Overall cognitive status: Within functional limits for tasks assessed   SENSATION: Light touch: decreased light touch from lateral malleolus distally.   LOWER EXTREMITY ROM:     Active  Right Eval Left Eval  Hip flexion    Hip extension    Hip abduction    Hip adduction    Hip internal rotation    Hip external rotation    Knee flexion    Knee extension    Ankle dorsiflexion    Ankle plantarflexion    Ankle inversion    Ankle eversion     (Blank rows = not tested)  LOWER EXTREMITY MMT:    MMT Right Eval Left Eval Right 06/25/24 Left 06/25/24  Hip flexion      Hip extension      Hip abduction      Hip adduction      Hip internal rotation      Hip external rotation      Knee flexion      Knee extension 4- 4+ 4+ 5  Ankle dorsiflexion      Ankle plantarflexion      Ankle inversion      Ankle eversion      (Blank rows = not tested)   TRANSFERS: Sit to stand: Modified independence  Assistive device utilized: Environmental consultant - 2 wheeled     Stand to sit: Modified independence  Assistive device utilized: Environmental consultant - 2 wheeled     Chair to chair: SBA  Assistive device utilized: Environmental consultant - 2 wheeled       GAIT: Findings: Gait Characteristics: step through pattern, decreased arm swing- Right, decreased arm swing- Left, decreased step length- Right, decreased step length- Left, decreased hip/knee flexion- Right, decreased hip/knee flexion- Left, Right foot flat, Left foot flat, and narrow BOS, Distance walked: 29ft, Assistive device utilized:Walker - 2 wheeled, Level of assistance: Modified independence, and Comments:    FUNCTIONAL TESTS:  TUG: 25.71 seconds  DGI: 05/13/24: 8 / 24  L SLS: <0.1 seconds  R SLS: <0.1  seconds  07/28/24: SLS L: 2.06 seconds R: 2.23   Norms: 18-39  F: 43.5 seconds  M: 43.2 seconds 40-49  F: 40.4 seconds  M: 40.1 seconds 50-59  F: 36 seconds  M: 38.1 seconds 60-69  F: 25.1 seconds  M: 28.7 seconds 70-79  F: 11.3 seconds  M: 18.3 seconds  PATIENT SURVEYS:  ABC Scale:950 / 1600 = 59.4 % 07/28/24: 1070 / 1600 = 66.9 %                                                                                                                             TREATMENT DATE:  08/06/24 PWR step using parallel bars x 10 each PWR twist using scarves x 10 each Squat with kettle bell 5# 2 x 10 PWR rock with cones balancing in hands 2 x 10 each Seated PWR up with PVC poles x 8 Walking around mat table using PVC poles in Ue's Nustep to end treatment level 5 x 5' UE and LE    07/28/2024  Progress notes: goals tracked, SLS assessed, DGI performed, pt educated on the updated goals, role of PT, prognosis, and importance of updated HEP compliance.  DGI FORM: Input Gait level surface - walking at normal speed 2 Change in gait speed 2 Gait with horizontal head turns 2 Gait with vertical head turns 2 Gait and pivot turn 1 Step over obstacle 2 Step around obstacles 2 Steps 2  Result Dynamic Gait Index 15 Interpretation: This result is consistent with an increased fall risk. Neuromuscular Re-education: -SLS trials, 3 reps bilaterally, pt cued for sequencing to get in positioning Therapeutic Activity:  -Walking bout with quad cane and min assist, 20 feet, pt cued for UE placement and sequencing   07/23/24: - STS holding spiral tidal tank 8 reps then report of Lt shoulder pain, given blue ball without weight for 12 more reps - Dead lift incorporating low reaching activities 2x 10 - Cone rotation standing NBOS on foam - 6in lunge with 1 UE support 15x - Squat front of chair 20x - Nustep [place] UE/LE seat 8 L7 x 5'  07/21/24: - Nustep seat 8 x 5' level 7, pt avgs 70 SPM;  [place] UE/LE - STS 15x no UE A - Reports slight light headedness, in a fog - Vitals checked Lt arm in seated position:  134/72 mmHg HR 76 - 6in step up and over 1 UE support required 10x  - Squat no HHA front of chair 10x - 6in lunge with 2 UE support 15x - Gait training with SBQC inside // bars cueing for sequence 3RT  PATIENT EDUCATION: Education details: PT Evaluation, findings, prognosis, frequency, attendance policy. Person educated: Patient Education method: Explanation and Demonstration Education comprehension: verbalized understanding  HOME EXERCISE PROGRAM: Access Code: [email or web address] URL: [email or web address] Date: 05/14/2024 Prepared by: Augustin Mclean  Exercises - Sit to Stand  - 1 x daily - 7  x weekly - 3 sets - 10 reps - Standing March with Counter Support  - 1 x daily - 7 x weekly - 3 sets - 10 reps - Standing Hip Abduction with Counter Support  - 1 x daily - 7 x weekly - 3 sets - 10 reps - Side Stepping with Resistance at Thighs and Counter Support  - 1 x daily - 7 x weekly - 3 sets - 10 reps  06/30/24: - Squat with Chair and Counter Support  - 2 x daily - 7 x weekly - 1 sets - 10 reps - Standing Tandem Balance with Counter Support  - 1 x daily - 7 x weekly - 1 sets - 3 reps - 30 hold  GOALS: Goals reviewed with patient? No  SHORT TERM GOALS: Target date: 05/25/2024  Pt will be independent with HEP in order to demonstrate participation in Physical Therapy POC.  Baseline: Goal status: MET  2.  Pt will improve ABC score by 15% in order to demonstrate improved pain with functional goals and outcomes. Baseline: 07/28/24: 1070 / 1600 = 66.9 % Goal status: IN PROGRESS  LONG TERM GOALS: Target date: 06/22/2024  Pt will increase DGI score by > MCID in order to demonstrate improved functional safety and balance skills in ADL/mobility.   Baseline: see objective. 06/25/24  14/24 07/28/24: 15/24, (MCID of 6 points) Goal status: MET  2.  Pt will improve TUG  by least 3 seconds in order to demonstrate improved functional mobility capacity in community setting.  Baseline: see objective. 06/25/24 21.38 sec with RW Goal status: MET  3.  Pt will improve ABC score by 30% in order to demonstrate improved pain with functional goals and outcomes. Baseline: see objective.  06/25/24 58.8%;  07/28/24: 1070 / 1600 = 66.9 % Goal status: IN PROGRESS  4.  Pt will improve single leg balance by at least 3 seconds bilaterally in order to demonstrate improved capacity, safety, and overall quality of movement to optimize function.  Baseline: see objective. 06/25/24 2 on left and 1 on right  07/28/24: L: 2.06 seconds R: 2.23 Goal status: IN PROGRESS  5. Pt will demonstrate ability to ambulate 50 feet with quad cane and SBA in less than 1 minute for increased independence with gait training and improved community ambulation.  Baseline: 07/28/24:  pt able to walk 20 feet with min assist and quad cane Goal status: IN PROGRESS  ASSESSMENT:  CLINICAL IMPRESSION: Today's session with focus on lower extremity strength and balance.  Added in PWR moves today to work on balance and coordination.  She is hesitant with PWR rock and twist; needs CGA needed for safety.  Patient reports some left shoulder discomfort during treatment today.  Otherwise no new pain complaint.   Patient would continue to benefit from skilled physical therapy for increased endurance with ambulation, increased LE strength, and improved balance for improved quality of life, improved independence with gait training and continued progress towards therapy goals.   Eval:  Patient is a 68y.o. female who was seen today for physical therapy evaluation and treatment for 'compression of spinal cord.'  Patient with history of myelopathy C5 spinal fracture, with A C4 through C7 cervical fusion.  Patient presents with significant functional mobility deficits, safety concerns, increased falls risk, reduced ambulation  capacity at due to muscle weakness, decreased activity tolerance, balance deficits in setting of previous cervical myelopathy pt will benefit from skilled Physical Therapy services to address deficits/limitations in order to improve functional and  QOL.    OBJECTIVE IMPAIRMENTS: Abnormal gait, decreased activity tolerance, decreased balance, decreased mobility, difficulty walking, decreased strength, and postural dysfunction.   ACTIVITY LIMITATIONS: carrying, lifting, bending, standing, squatting, stairs, transfers, bed mobility, and locomotion level  PARTICIPATION LIMITATIONS: meal prep, cleaning, laundry, community activity, occupation, and yard work  PERSONAL FACTORS: Age are also affecting patient's functional outcome.   REHAB POTENTIAL: Excellent  CLINICAL DECISION MAKING: Stable/uncomplicated  EVALUATION COMPLEXITY: Low  PLAN:  PT FREQUENCY: 2x/week  PT DURATION: 4 weeks  PLANNED INTERVENTIONS: 97164- PT Re-evaluation, 97750- Physical Performance Testing, 97110-Therapeutic exercises, 97530- Therapeutic activity, [id number]- Neuromuscular re-education, 97535- Self Care, 02859- Manual therapy, [phone]- Gait training, [phone]- Orthotic Initial, Patient/Family education, Balance training, Stair training, Taping, Joint mobilization, Joint manipulation, Spinal manipulation, Spinal mobilization, Cryotherapy, and Moist heat  PLAN FOR NEXT SESSION: Functional mobility, strengthening, ambulation, etc.  Reaching forward over low surfaces to improve confidence in bathroom.   2:41 PM, 08/06/24 Cinde Ebert Small Egon Dittus MPT [facility] [place] [phone]

## 2024-08-06 NOTE — Therapy (Signed)
 OUTPATIENT OCCUPATIONAL THERAPY NEURO TREATMENT NOTE   Patient Name: Angela Norman MRN: [id number] DOB:08/28/1956, 68 y.o., female Today's Date: 08/06/2024    PCP: Shona Norleen PEDLAR, MD REFERRING PROVIDER: Hillman Amour, MD   END OF SESSION:  OT End of Session - 08/06/24 1433     Visit Number 21    Number of Visits 24    Date for Recertification  08/14/24    Authorization Type Medicare Part A and B    Progress Note Due on Visit 26    OT Start Time 1348    OT Stop Time 1433    OT Time Calculation (min) 45 min    Activity Tolerance Patient tolerated treatment well    Behavior During Therapy WFL for tasks assessed/performed           Past Medical History:  Diagnosis Date   Anxiety    Arthritis    Basal cell carcinoma    Charcot's joint of foot    bilateral   Diabetes mellitus without complication (HCC)    Fibromyalgia    Neuropathy    Past Surgical History:  Procedure Laterality Date   ACHILLES TENDON REPAIR Left    bone spur Left    --left foot   BREAST SURGERY     benign breast bx--left--fatty tissue   CARPAL TUNNEL RELEASE Bilateral    COLONOSCOPY     COLONOSCOPY WITH PROPOFOL  N/A 09/06/2022   Procedure: COLONOSCOPY WITH PROPOFOL ;  Surgeon: Shaaron Lamar HERO, MD;  [facility]: [facility];  Service: Endoscopy;  Laterality: N/A;  10:30am, asa 2   DILATION AND CURETTAGE OF UTERUS  2011   for cervical polyp   FOOT SURGERY Left 03/2018   reconstructive surgery   POLYPECTOMY  09/06/2022   Procedure: POLYPECTOMY;  Surgeon: Shaaron Lamar HERO, MD;  [facility];  Service: Endoscopy;;   Patient Active Problem List   Diagnosis Date Noted   Anxiety 06/28/2022   Arthritis 06/28/2022   Body fluid retention 06/28/2022   Hyperglycemia due to type 2 diabetes mellitus (HCC) 06/28/2022   Mixed hyperlipidemia 06/28/2022   Fibromyalgia 06/28/2022   Cough 10/25/2021   Musculoskeletal pain 05/21/2012   Stiffness of joint, not elsewhere classified, ankle  and foot 08/13/2011   Difficulty walking 08/13/2011   Ankle weakness 08/13/2011    ONSET DATE: 08/2023  REFERRING DIAG:  G95.20 (ICD-10-CM) - Unspecified cord compression  R25.2 (ICD-10-CM) - Spastic   THERAPY DIAG:  Other lack of coordination  Other symptoms and signs involving the nervous system  Rationale for Evaluation and Treatment: Rehabilitation  SUBJECTIVE:   SUBJECTIVE STATEMENT: S: It felt like my shoulder dislocated tying my shoes  PERTINENT HISTORY: Pt reported all this started with UTI that caused fracture f neck and indicated as C4-C7 fusion. Pt had multiple bouts of acute and inpatient rehab stays due to complications. Significant BP issues throughout this time. Had stay in multiple facilities. Pt reported various changes in functional status, was requiring assist for ADLs and mobility since April 28th. Since then, pt has been home with Home Health services. Pt continues to report numbness in BUE and poor grip strength. Pt reports neuropathy in BLE. Prior to onset of November '24 was walking daily for 45-60 minutes.   PRECAUTIONS: Fall  WEIGHT BEARING RESTRICTIONS: No  PAIN:  Are you having pain? Yes: NPRS scale: 3/10 Pain location: left shoulder and arm Pain description: sore to the touch Aggravating factors: overuse Relieving factors: pain cream  FALLS: Has patient fallen  in last 6 months? No  LIVING ENVIRONMENT: Lives with: lives with their spouse Lives in: House/apartment Stairs: Pt lives in the basement with no stairs Has following equipment at home: Vannie - 2 wheeled and shower chair  PLOF: Independent  PATIENT GOALS: To improve mobility and strengthening  OBJECTIVE:  Note: Objective measures were completed at Evaluation unless otherwise noted.  HAND DOMINANCE: Right  ADLs: Overall ADLs: Pt has difficulty with all small object manipulation, including but not limited to buttons, shoe laces, and zippers. Additionally she has max difficulty  with cooking and cleaning due to weakness in both hands and wrists.   MOBILITY STATUS: Hx of falls, difficulty with turns, and difficulty carrying objections with ambulation  POSTURE COMMENTS:  rounded shoulders Sitting balance: Moves/returns truncal midpoint 1-2 inches in multiple planes  ACTIVITY TOLERANCE: Activity tolerance: Fair tolerance - fatigues quicker than normal  FUNCTIONAL OUTCOME MEASURES: Quick Dash: 47.73 07/16/24: 20.45  UPPER EXTREMITY ROM:    All ROM is [facility]  UPPER EXTREMITY MMT:     MMT Right eval Left eval Right 07/16/24 Left 07/16/24  Shoulder flexion 5/5 5/5 5/5 5/5  Shoulder abduction 5/5 5/5 5/5 4/5  Shoulder internal rotation 5/5 5/5 5/5 5/5  Shoulder external rotation 5/5 5/5 4+/5 4+/5  Elbow flexion 5/5 5/5 5/5 5/5  Elbow extension 5/5 5/5 4+/5 4+/5  Wrist flexion 4/5 4+/5 4+/5 5/5  Wrist extension 4-/5 4/5 5/5 5/5  Wrist ulnar deviation 4-/5 4/5 4/5 4+/5  Wrist radial deviation 4-/5 4-/5 5/5 5/5  Wrist pronation 4/5 4+/5 5/5 5/5  Wrist supination 4/5 4/5 4+/5 4+/5  (Blank rows = not tested)  HAND FUNCTION: Grip strength: Right: 20 lbs; Left: 27 lbs, Lateral pinch: Right: 5 lbs, Left: 9 lbs, and 3 point pinch: Right: 5 lbs, Left: 4 lbs 07/16/24: Grip strength: Right: 31 lbs; Left: 35 lbs, Lateral pinch: Right: 7 lbs, Left: 8 lbs, and 3 point pinch: Right: 7 lbs, Left: 9 lbs  COORDINATION: 06/30/24-9 Hole Peg test: Right: 33.28 sec; Left: 38.26 sec 07/16/24- 9 Hole Peg test: Right: 52.15 sec; Left: 49.88 sec  SENSATION: Pt has full numbness along median nerve of R hand as well as dulled sensation and tingling in BUE up to elbows.   EDEMA: No swelling noted  OBSERVATIONS: adducted thumbs and 80% grip                                                                                                                             TREATMENT DATE:  08/06/24 -Myofascial release to left anterior shoulder to decrease pain and fascial restrictions and  increase functional use of LUE during ADLs -Shoulder Strengthening: 2#, protraction, horizontal abduction, flexion, abduction, er/IR, x12 -Strengthening: 2#, bicep curls, tricep kick backs, pronated curls, hammer curls, x12 -Theraputty: red, BUE, roll into ball, flatten into pancake, PVC pipe to cut circles, roll into log, tripod pinch, lateral pinch, roll into ball and squeeze  08/04/24 -Cervical ROM: flexion/extension, rotations,  lateral leans, chin tucks, x10 -Cervical Stretches: using towel, rotations, lateral leans, chin tucks, 4x15 holds -PNF Strengthening: red band, chest pulls, overhead pulls, PNF up, PNF down, er pulls, x12 -Grooved peg board  07/30/24 -Myofascial release to left anterior shoulder to decrease pain and fascial restrictions and increase functional use of LUE during ADLs -Hand gripper, BUE: Right-large beads with gripper vertical at 29#; Left-large and medium beads gripper horizontal at 29# and 25# -Pinch task: pt using red clothespin and 3 point pinch to grasp and stack 4 towers of 5 sponges with left hand; switching to right hand to remove and replace into bucket. -Therapy ball strengthening: blue kickball-chest press, overhead press, flexion, circles each direction, 10 reps   PATIENT EDUCATION: Education details: Continue HEP Person educated: Patient Education method: Explanation, Demonstration, and Handouts Education comprehension: verbalized understanding and returned demonstration  HOME EXERCISE PROGRAM: 8/1: Wrist ROM and Digit ROM 8/8: Shoulder A/ROM 8/20: Scapular Strengthening 9/4: Coin work 9/18: theraband wrist strengthening-red 10/14: PNF Strengthening    GOALS: Goals reviewed with patient? Yes  SHORT TERM GOALS: Target date: 06/19/24  Pt will be provided and educated on a comprehensive HEP for BUE mobility in order to complete ADL's and IADL's independently.   Goal status: IN PROGRESS  2.  Pt will improve BUE strength to 5/5 in order to  lift and carry items during cooking and cleaning tasks.   Goal status: IN PROGRESS  3.  Pt will improve BUE grip by 15# and pinch by 3# in order to grasp and hold pots and pans.  Goal status: IN PROGRESS  4.  Pt will improve BUE coordination by completing 9 hole peg test in 45 or less in order to manipulate and complete fine motor tasks.   Goal status: MET   ASSESSMENT:  CLINICAL IMPRESSION: Pt had a drop sensation in her R shoulder tying her shoes, where she felt like it has dislocated. This session her R shoulder is tender and both trapezius feel stiff and achy. OT provided manual therapy to reduce pain and stiffness. Remainder of session focused on strengthening from shoulders down to hands. Verbal and tactile cuing provided for positioning and technique.    PERFORMANCE DEFICITS: in functional skills including ADLs, IADLs, coordination, dexterity, sensation, ROM, strength, pain, fascial restrictions, Fine motor control, Gross motor control, body mechanics, and UE functional use.    PLAN:  OT FREQUENCY: 2x/week  OT DURATION: 4 weeks  PLANNED INTERVENTIONS: 97168 OT Re-evaluation, 97535 self care/ADL training, 02889 therapeutic exercise, 97530 therapeutic activity, 97112 neuromuscular re-education, 97140 manual therapy, 97035 ultrasound, 97018 paraffin, 02989 moist heat, 97032 electrical stimulation (manual), passive range of motion, functional mobility training, energy conservation, coping strategies training, patient/family education, and DME and/or AE instructions  CONSULTED AND AGREED WITH PLAN OF CARE: Patient  PLAN FOR NEXT SESSION: Wrist and Digit ROM, strengthening, coordination tasks   Valentin Nightingale, OTR/L [phone] 08/06/2024, 4:26 PM

## 2024-08-10 ENCOUNTER — Ambulatory Visit (HOSPITAL_COMMUNITY): Admitting: Physical Therapy

## 2024-08-10 DIAGNOSIS — Z7409 Other reduced mobility: Secondary | ICD-10-CM

## 2024-08-10 DIAGNOSIS — G9520 Unspecified cord compression: Secondary | ICD-10-CM

## 2024-08-10 DIAGNOSIS — M6281 Muscle weakness (generalized): Secondary | ICD-10-CM

## 2024-08-10 NOTE — Therapy (Signed)
 OUTPATIENT PHYSICAL THERAPY NEURO TREATMENT   Patient Name: Angela Norman MRN: [id number] DOB:06/16/1956, 68 y.o., female Today's Date: 08/10/2024     END OF SESSION:  PT End of Session - 08/10/24 1502     Visit Number 22    Number of Visits 28    Date for Recertification  08/28/24    Authorization Type Medicare A & B    Progress Note Due on Visit 28    PT Start Time 1405    PT Stop Time 1450    PT Time Calculation (min) 45 min    Equipment Utilized During Treatment Gait belt    Activity Tolerance Patient tolerated treatment well    Behavior During Therapy WFL for tasks assessed/performed                 Past Medical History:  Diagnosis Date   Anxiety    Arthritis    Basal cell carcinoma    Charcot's joint of foot    bilateral   Diabetes mellitus without complication (HCC)    Fibromyalgia    Neuropathy    Past Surgical History:  Procedure Laterality Date   ACHILLES TENDON REPAIR Left    bone spur Left    --left foot   BREAST SURGERY     benign breast bx--left--fatty tissue   CARPAL TUNNEL RELEASE Bilateral    COLONOSCOPY     COLONOSCOPY WITH PROPOFOL  N/A 09/06/2022   Procedure: COLONOSCOPY WITH PROPOFOL ;  Surgeon: Shaaron Lamar HERO, MD;  [facility];  Service: Endoscopy;  Laterality: N/A;  10:30am, asa 2   DILATION AND CURETTAGE OF UTERUS  2011   for cervical polyp   FOOT SURGERY Left 03/2018   reconstructive surgery   POLYPECTOMY  09/06/2022   Procedure: POLYPECTOMY;  Surgeon: Shaaron Lamar HERO, MD;  Location: [facility];  Service: Endoscopy;;   Patient Active Problem List   Diagnosis Date Noted   Anxiety 06/28/2022   Arthritis 06/28/2022   Body fluid retention 06/28/2022   Hyperglycemia due to type 2 diabetes mellitus (HCC) 06/28/2022   Mixed hyperlipidemia 06/28/2022   Fibromyalgia 06/28/2022   Cough 10/25/2021   Musculoskeletal pain 05/21/2012   Stiffness of joint, not elsewhere classified, ankle and foot 08/13/2011    Difficulty walking 08/13/2011   Ankle weakness 08/13/2011   PCP: Sebastian Othel GAILS, FNP REFERRING PROVIDER: Edna Norris, MD ONSET DATE: November 2024  REFERRING DIAG: compression of spinal cord   THERAPY DIAG:  No diagnosis found.  Rationale for Evaluation and Treatment: Rehabilitation  SUBJECTIVE:  SUBJECTIVE STATEMENT: Pt states her pain increased mid morning on Friday and feels it came from the new exercises instructed her last visit.  Sharp pains into Lt lower back at waist and down into Lt hip.  States she's been using head and meds since then and everyday has started feeling a little bit better.  Currently having 5/10 pain in that area.   Eval:  Pt reported all this started with UTI that caused fracture of neck and indicated as C4-C7 fusion. Pt had multiple bouts of acute and inpatient rehab stays due to complications. Significant BP issues throughout this time. Had stay in multiple facilities. Pt reported various changes in functional status, was requiring assist for ADLs and mobility since April 28th. Since then, pt has been home with Home Health services. Pt continues to report numbness in BUE and poor grip strength. Pt reports neuropathy in BLE. Prior to onset of November '24 was walking daily for 45-60 minutes.   Pt has CT scan for nodule on Lung on 04/28/24.  Pt accompanied by: self  PERTINENT HISTORY:  Cervical Myelopathy with ACDF 4-7 in November 2024\ Left ankle surgery x 3  PAIN:  Are you having pain? No and chronic pain in left shoulder  PRECAUTIONS: None  RED FLAGS: Bowel or bladder incontinence: Yes: SCI   WEIGHT BEARING RESTRICTIONS: No  FALLS: Has patient fallen in last 6 months? Yes. Number of falls 1-2   PATIENT GOALS:  want to walk with AD well and with  confidence and strengthening RUE.  OBJECTIVE:  Note: Objective measures were completed at Evaluation unless otherwise noted.  DIAGNOSTIC FINDINGS:   COGNITION: Overall cognitive status: Within functional limits for tasks assessed   SENSATION: Light touch: decreased light touch from lateral malleolus distally.   LOWER EXTREMITY ROM:     Active  Right Eval Left Eval  Hip flexion    Hip extension    Hip abduction    Hip adduction    Hip internal rotation    Hip external rotation    Knee flexion    Knee extension    Ankle dorsiflexion    Ankle plantarflexion    Ankle inversion    Ankle eversion     (Blank rows = not tested)  LOWER EXTREMITY MMT:    MMT Right Eval Left Eval Right 06/25/24 Left 06/25/24  Hip flexion      Hip extension      Hip abduction      Hip adduction      Hip internal rotation      Hip external rotation      Knee flexion      Knee extension 4- 4+ 4+ 5  Ankle dorsiflexion      Ankle plantarflexion      Ankle inversion      Ankle eversion      (Blank rows = not tested)   TRANSFERS: Sit to stand: Modified independence  Assistive device utilized: Environmental consultant - 2 wheeled     Stand to sit: Modified independence  Assistive device utilized: Environmental consultant - 2 wheeled     Chair to chair: SBA  Assistive device utilized: Environmental consultant - 2 wheeled       GAIT: Findings: Gait Characteristics: step through pattern, decreased arm swing- Right, decreased arm swing- Left, decreased step length- Right, decreased step length- Left, decreased hip/knee flexion- Right, decreased hip/knee flexion- Left, Right foot flat, Left foot flat, and narrow BOS, Distance walked: 69ft, Assistive device utilized:Walker - 2 wheeled, Level of  assistance: Modified independence, and Comments:    FUNCTIONAL TESTS:  TUG: 25.71 seconds  DGI: 05/13/24: 8 / 24  L SLS: <0.1 seconds  R SLS: <0.1 seconds  07/28/24: SLS L: 2.06 seconds R: 2.23   Norms: 18-39  F: 43.5 seconds  M: 43.2  seconds 40-49  F: 40.4 seconds  M: 40.1 seconds 50-59  F: 36 seconds  M: 38.1 seconds 60-69  F: 25.1 seconds  M: 28.7 seconds 70-79  F: 11.3 seconds  M: 18.3 seconds  PATIENT SURVEYS:  ABC Scale:950 / 1600 = 59.4 % 07/28/24: 1070 / 1600 = 66.9 %                                                                                                                             TREATMENT DATE:  08/10/24 Nustep seat 10 x 5' level 47, pt avgs 85 SPM; [place] UE/LE Standing: 6 lateral step up and over 1 UE support required 10x  6 forward step over 1 UE support 10X 6 lunge with 2 UE support 2X10 each Gait training with SBQC inside // bars cueing for sequence 3RT with CGA intially then SBA   08/06/24 PWR step using parallel bars x 10 each PWR twist using scarves x 10 each Squat with kettle bell 5# 2 x 10 PWR rock with cones balancing in hands 2 x 10 each Seated PWR up with PVC poles x 8 Walking around mat table using PVC poles in Ue's Nustep to end treatment level 5 x 5' UE and LE    07/28/2024  Progress notes: goals tracked, SLS assessed, DGI performed, pt educated on the updated goals, role of PT, prognosis, and importance of updated HEP compliance.  DGI FORM: Input Gait level surface - walking at normal speed 2 Change in gait speed 2 Gait with horizontal head turns 2 Gait with vertical head turns 2 Gait and pivot turn 1 Step over obstacle 2 Step around obstacles 2 Steps 2  Result Dynamic Gait Index 15 Interpretation: This result is consistent with an increased fall risk. Neuromuscular Re-education: -SLS trials, 3 reps bilaterally, pt cued for sequencing to get in positioning Therapeutic Activity:  -Walking bout with quad cane and min assist, 20 feet, pt cued for UE placement and sequencing   07/23/24: - STS holding spiral tidal tank 8 reps then report of Lt shoulder pain, given blue ball without weight for 12 more reps - Dead lift incorporating low reaching  activities 2x 10 - Cone rotation standing NBOS on foam - 6in lunge with 1 UE support 15x - Squat front of chair 20x - Nustep [place] UE/LE seat 8 L7 x 5'  07/21/24: - Nustep seat 8 x 5' level 7, pt avgs 70 SPM; [place] UE/LE - STS 15x no UE A - Reports slight light headedness, in a fog - Vitals checked Lt arm in seated position:  134/72 mmHg HR 76 - 6in step up and over 1 UE support required  10x  - Squat no HHA front of chair 10x - 6in lunge with 2 UE support 15x - Gait training with SBQC inside // bars cueing for sequence 3RT  PATIENT EDUCATION: Education details: PT Evaluation, findings, prognosis, frequency, attendance policy. Person educated: Patient Education method: Explanation and Demonstration Education comprehension: verbalized understanding  HOME EXERCISE PROGRAM: Access Code: [email or web address] URL: [email or web address] Date: 05/14/2024 Prepared by: Augustin Mclean  Exercises - Sit to Stand  - 1 x daily - 7 x weekly - 3 sets - 10 reps - Standing March with Counter Support  - 1 x daily - 7 x weekly - 3 sets - 10 reps - Standing Hip Abduction with Counter Support  - 1 x daily - 7 x weekly - 3 sets - 10 reps - Side Stepping with Resistance at Thighs and Counter Support  - 1 x daily - 7 x weekly - 3 sets - 10 reps  06/30/24: - Squat with Chair and Counter Support  - 2 x daily - 7 x weekly - 1 sets - 10 reps - Standing Tandem Balance with Counter Support  - 1 x daily - 7 x weekly - 1 sets - 3 reps - 30 hold  GOALS: Goals reviewed with patient? No  SHORT TERM GOALS: Target date: 05/25/2024  Pt will be independent with HEP in order to demonstrate participation in Physical Therapy POC.  Baseline: Goal status: MET  2.  Pt will improve ABC score by 15% in order to demonstrate improved pain with functional goals and outcomes. Baseline: 07/28/24: 1070 / 1600 = 66.9 % Goal status: IN PROGRESS  LONG TERM GOALS: Target date: 06/22/2024  Pt will increase DGI score  by > MCID in order to demonstrate improved functional safety and balance skills in ADL/mobility.   Baseline: see objective. 06/25/24  14/24 07/28/24: 15/24, (MCID of 6 points) Goal status: MET  2.  Pt will improve TUG by least 3 seconds in order to demonstrate improved functional mobility capacity in community setting.  Baseline: see objective. 06/25/24 21.38 sec with RW Goal status: MET  3.  Pt will improve ABC score by 30% in order to demonstrate improved pain with functional goals and outcomes. Baseline: see objective.  06/25/24 58.8%;  07/28/24: 1070 / 1600 = 66.9 % Goal status: IN PROGRESS  4.  Pt will improve single leg balance by at least 3 seconds bilaterally in order to demonstrate improved capacity, safety, and overall quality of movement to optimize function.  Baseline: see objective. 06/25/24 2 on left and 1 on right  07/28/24: L: 2.06 seconds R: 2.23 Goal status: IN PROGRESS  5. Pt will demonstrate ability to ambulate 50 feet with quad cane and SBA in less than 1 minute for increased independence with gait training and improved community ambulation.  Baseline: 07/28/24:  pt able to walk 20 feet with min assist and quad cane Goal status: IN PROGRESS  ASSESSMENT:  CLINICAL IMPRESSION: Continued with focus on lower extremity strength and balance.  Held all PWR moves today due to increased symptoms.  Continued with focus on balance and coordination.  Pt required CGA initially with gait using QC for safety/confidence but then able to complete with SBA only.  No LOB or issues during session.  Several short rest breaks taken but overall all completed with minimal fatigue, no pain or LOB.  Pt will continue to benefit from skilled physical therapy for increased endurance with ambulation, increased LE strength, and improved balance for improved quality of life, improved independence  with gait training and continued progress towards therapy goals.   Eval:  Patient is a 68y.o. female who was  seen today for physical therapy evaluation and treatment for 'compression of spinal cord.'  Patient with history of myelopathy C5 spinal fracture, with A C4 through C7 cervical fusion.  Patient presents with significant functional mobility deficits, safety concerns, increased falls risk, reduced ambulation capacity at due to muscle weakness, decreased activity tolerance, balance deficits in setting of previous cervical myelopathy pt will benefit from skilled Physical Therapy services to address deficits/limitations in order to improve functional and QOL.    OBJECTIVE IMPAIRMENTS: Abnormal gait, decreased activity tolerance, decreased balance, decreased mobility, difficulty walking, decreased strength, and postural dysfunction.   ACTIVITY LIMITATIONS: carrying, lifting, bending, standing, squatting, stairs, transfers, bed mobility, and locomotion level  PARTICIPATION LIMITATIONS: meal prep, cleaning, laundry, community activity, occupation, and yard work  PERSONAL FACTORS: Age are also affecting patient's functional outcome.   REHAB POTENTIAL: Excellent  CLINICAL DECISION MAKING: Stable/uncomplicated  EVALUATION COMPLEXITY: Low  PLAN:  PT FREQUENCY: 2x/week  PT DURATION: 4 weeks  PLANNED INTERVENTIONS: 97164- PT Re-evaluation, 97750- Physical Performance Testing, 97110-Therapeutic exercises, 97530- Therapeutic activity, 97112- Neuromuscular re-education, 97535- Self Care, 02859- Manual therapy, [phone]- Gait training, [phone]- Orthotic Initial, Patient/Family education, Balance training, Stair training, Taping, Joint mobilization, Joint manipulation, Spinal manipulation, Spinal mobilization, Cryotherapy, and Moist heat  PLAN FOR NEXT SESSION: Functional mobility, strengthening, ambulation, etc.   3:02 PM, 08/10/24 Greig KATHEE Fuse, PTA/CLT [facility] Ph: [phone]

## 2024-08-11 ENCOUNTER — Encounter (HOSPITAL_COMMUNITY): Payer: Self-pay | Admitting: Occupational Therapy

## 2024-08-11 ENCOUNTER — Ambulatory Visit (HOSPITAL_COMMUNITY): Admitting: Occupational Therapy

## 2024-08-11 DIAGNOSIS — G9520 Unspecified cord compression: Secondary | ICD-10-CM | POA: Diagnosis not present

## 2024-08-11 DIAGNOSIS — R278 Other lack of coordination: Secondary | ICD-10-CM

## 2024-08-11 DIAGNOSIS — R29818 Other symptoms and signs involving the nervous system: Secondary | ICD-10-CM

## 2024-08-11 NOTE — Therapy (Signed)
 OUTPATIENT OCCUPATIONAL THERAPY NEURO TREATMENT NOTE   Patient Name: Angela Norman MRN: [id number] DOB:Mar 18, 1956, 68 y.o., female Today's Date: 08/11/2024    PCP: Shona Norleen PEDLAR, MD REFERRING PROVIDER: Hillman Amour, MD   END OF SESSION:  OT End of Session - 08/11/24 1239     Visit Number 22    Number of Visits 24    Date for Recertification  08/14/24    Authorization Type Medicare Part A and B    Progress Note Due on Visit 26    OT Start Time 1132    OT Stop Time 1216    OT Time Calculation (min) 44 min    Activity Tolerance Patient tolerated treatment well    Behavior During Therapy WFL for tasks assessed/performed          Past Medical History:  Diagnosis Date   Anxiety    Arthritis    Basal cell carcinoma    Charcot's joint of foot    bilateral   Diabetes mellitus without complication (HCC)    Fibromyalgia    Neuropathy    Past Surgical History:  Procedure Laterality Date   ACHILLES TENDON REPAIR Left    bone spur Left    --left foot   BREAST SURGERY     benign breast bx--left--fatty tissue   CARPAL TUNNEL RELEASE Bilateral    COLONOSCOPY     COLONOSCOPY WITH PROPOFOL  N/A 09/06/2022   Procedure: COLONOSCOPY WITH PROPOFOL ;  Surgeon: Shaaron Lamar HERO, MD;  [facility];  Service: Endoscopy;  Laterality: N/A;  10:30am, asa 2   DILATION AND CURETTAGE OF UTERUS  2011   for cervical polyp   FOOT SURGERY Left 03/2018   reconstructive surgery   POLYPECTOMY  09/06/2022   Procedure: POLYPECTOMY;  Surgeon: Shaaron Lamar HERO, MD;  [facility];  Service: Endoscopy;;   Patient Active Problem List   Diagnosis Date Noted   Anxiety 06/28/2022   Arthritis 06/28/2022   Body fluid retention 06/28/2022   Hyperglycemia due to type 2 diabetes mellitus (HCC) 06/28/2022   Mixed hyperlipidemia 06/28/2022   Fibromyalgia 06/28/2022   Cough 10/25/2021   Musculoskeletal pain 05/21/2012   Stiffness of joint, not elsewhere classified, ankle and  foot 08/13/2011   Difficulty walking 08/13/2011   Ankle weakness 08/13/2011    ONSET DATE: 08/2023  REFERRING DIAG:  G95.20 (ICD-10-CM) - Unspecified cord compression  R25.2 (ICD-10-CM) - Spastic   THERAPY DIAG:  Other lack of coordination  Other symptoms and signs involving the nervous system  Rationale for Evaluation and Treatment: Rehabilitation  SUBJECTIVE:   SUBJECTIVE STATEMENT: S: It felt like my shoulder dislocated tying my shoes  PERTINENT HISTORY: Pt reported all this started with UTI that caused fracture f neck and indicated as C4-C7 fusion. Pt had multiple bouts of acute and inpatient rehab stays due to complications. Significant BP issues throughout this time. Had stay in multiple facilities. Pt reported various changes in functional status, was requiring assist for ADLs and mobility since April 28th. Since then, pt has been home with Home Health services. Pt continues to report numbness in BUE and poor grip strength. Pt reports neuropathy in BLE. Prior to onset of November '24 was walking daily for 45-60 minutes.   PRECAUTIONS: Fall  WEIGHT BEARING RESTRICTIONS: No  PAIN:  Are you having pain? Yes: NPRS scale: 3/10 Pain location: left shoulder and arm Pain description: sore to the touch Aggravating factors: overuse Relieving factors: pain cream  FALLS: Has patient fallen in  last 6 months? No  LIVING ENVIRONMENT: Lives with: lives with their spouse Lives in: House/apartment Stairs: Pt lives in the basement with no stairs Has following equipment at home: Vannie - 2 wheeled and shower chair  PLOF: Independent  PATIENT GOALS: To improve mobility and strengthening  OBJECTIVE:  Note: Objective measures were completed at Evaluation unless otherwise noted.  HAND DOMINANCE: Right  ADLs: Overall ADLs: Pt has difficulty with all small object manipulation, including but not limited to buttons, shoe laces, and zippers. Additionally she has max difficulty with  cooking and cleaning due to weakness in both hands and wrists.   MOBILITY STATUS: Hx of falls, difficulty with turns, and difficulty carrying objections with ambulation  POSTURE COMMENTS:  rounded shoulders Sitting balance: Moves/returns truncal midpoint 1-2 inches in multiple planes  ACTIVITY TOLERANCE: Activity tolerance: Fair tolerance - fatigues quicker than normal  FUNCTIONAL OUTCOME MEASURES: Quick Dash: 47.73 07/16/24: 20.45  UPPER EXTREMITY ROM:    All ROM is [facility]  UPPER EXTREMITY MMT:     MMT Right eval Left eval Right 07/16/24 Left 07/16/24  Shoulder flexion 5/5 5/5 5/5 5/5  Shoulder abduction 5/5 5/5 5/5 4/5  Shoulder internal rotation 5/5 5/5 5/5 5/5  Shoulder external rotation 5/5 5/5 4+/5 4+/5  Elbow flexion 5/5 5/5 5/5 5/5  Elbow extension 5/5 5/5 4+/5 4+/5  Wrist flexion 4/5 4+/5 4+/5 5/5  Wrist extension 4-/5 4/5 5/5 5/5  Wrist ulnar deviation 4-/5 4/5 4/5 4+/5  Wrist radial deviation 4-/5 4-/5 5/5 5/5  Wrist pronation 4/5 4+/5 5/5 5/5  Wrist supination 4/5 4/5 4+/5 4+/5  (Blank rows = not tested)  HAND FUNCTION: Grip strength: Right: 20 lbs; Left: 27 lbs, Lateral pinch: Right: 5 lbs, Left: 9 lbs, and 3 point pinch: Right: 5 lbs, Left: 4 lbs 07/16/24: Grip strength: Right: 31 lbs; Left: 35 lbs, Lateral pinch: Right: 7 lbs, Left: 8 lbs, and 3 point pinch: Right: 7 lbs, Left: 9 lbs  COORDINATION: 06/30/24-9 Hole Peg test: Right: 33.28 sec; Left: 38.26 sec 07/16/24- 9 Hole Peg test: Right: 52.15 sec; Left: 49.88 sec  SENSATION: Pt has full numbness along median nerve of R hand as well as dulled sensation and tingling in BUE up to elbows.   EDEMA: No swelling noted  OBSERVATIONS: adducted thumbs and 80% grip                                                                                                                             TREATMENT DATE:  08/11/24 -Mariette: teal, pronation and supination bends, ulnar and radial bends, flexion and extension  twists, x12 -Hand gripper: RUE 35# medium beads, LUE 25# medium beads -Coins: flipping 10 coins and holding all of them and placing in the piggy bank  08/06/24 -Myofascial release to left anterior shoulder to decrease pain and fascial restrictions and increase functional use of LUE during ADLs -Shoulder Strengthening: 2#, protraction, horizontal abduction, flexion, abduction, er/IR, x12 -Strengthening: 2#, bicep  curls, tricep kick backs, pronated curls, hammer curls, x12 -Theraputty: red, BUE, roll into ball, flatten into pancake, PVC pipe to cut circles, roll into log, tripod pinch, lateral pinch, roll into ball and squeeze  08/04/24 -Cervical ROM: flexion/extension, rotations, lateral leans, chin tucks, x10 -Cervical Stretches: using towel, rotations, lateral leans, chin tucks, 4x15 holds -PNF Strengthening: red band, chest pulls, overhead pulls, PNF up, PNF down, er pulls, x12 -Grooved peg board  07/30/24 -Myofascial release to left anterior shoulder to decrease pain and fascial restrictions and increase functional use of LUE during ADLs -Hand gripper, BUE: Right-large beads with gripper vertical at 29#; Left-large and medium beads gripper horizontal at 29# and 25# -Pinch task: pt using red clothespin and 3 point pinch to grasp and stack 4 towers of 5 sponges with left hand; switching to right hand to remove and replace into bucket. -Therapy ball strengthening: blue kickball-chest press, overhead press, flexion, circles each direction, 10 reps   PATIENT EDUCATION: Education details: Continue HEP Person educated: Patient Education method: Explanation, Demonstration, and Handouts Education comprehension: verbalized understanding and returned demonstration  HOME EXERCISE PROGRAM: 8/1: Wrist ROM and Digit ROM 8/8: Shoulder A/ROM 8/20: Scapular Strengthening 9/4: Coin work 9/18: theraband wrist strengthening-red 10/14: PNF Strengthening    GOALS: Goals reviewed with patient?  Yes  SHORT TERM GOALS: Target date: 06/19/24  Pt will be provided and educated on a comprehensive HEP for BUE mobility in order to complete ADL's and IADL's independently.   Goal status: IN PROGRESS  2.  Pt will improve BUE strength to 5/5 in order to lift and carry items during cooking and cleaning tasks.   Goal status: IN PROGRESS  3.  Pt will improve BUE grip by 15# and pinch by 3# in order to grasp and hold pots and pans.  Goal status: IN PROGRESS  4.  Pt will improve BUE coordination by completing 9 hole peg test in 45 or less in order to manipulate and complete fine motor tasks.   Goal status: MET   ASSESSMENT:  CLINICAL IMPRESSION: This session pt had increased back pain and discomfort limiting her tolerance for overall strengthening. This session pt attempted increased gripping tasks, however had max difficulty with both hands and required decreased resistance. Coordination is improving, as she is able to complete the coin task quickly without dropping any coins this session. OT providing verbal and tactile cuing for positioning and technique throughout session.    PERFORMANCE DEFICITS: in functional skills including ADLs, IADLs, coordination, dexterity, sensation, ROM, strength, pain, fascial restrictions, Fine motor control, Gross motor control, body mechanics, and UE functional use.    PLAN:  OT FREQUENCY: 2x/week  OT DURATION: 4 weeks  PLANNED INTERVENTIONS: 97168 OT Re-evaluation, 97535 self care/ADL training, 02889 therapeutic exercise, 97530 therapeutic activity, 97112 neuromuscular re-education, 97140 manual therapy, 97035 ultrasound, 97018 paraffin, 02989 moist heat, 97032 electrical stimulation (manual), passive range of motion, functional mobility training, energy conservation, coping strategies training, patient/family education, and DME and/or AE instructions  CONSULTED AND AGREED WITH PLAN OF CARE: Patient  PLAN FOR NEXT SESSION: Wrist and Digit ROM,  strengthening, coordination tasks   Valentin Nightingale, OTR/L [phone] 08/11/2024, 12:41 PM

## 2024-08-12 ENCOUNTER — Encounter (HOSPITAL_COMMUNITY): Payer: Self-pay

## 2024-08-12 ENCOUNTER — Ambulatory Visit (HOSPITAL_COMMUNITY): Admitting: Psychiatry

## 2024-08-12 DIAGNOSIS — F411 Generalized anxiety disorder: Secondary | ICD-10-CM | POA: Diagnosis not present

## 2024-08-12 NOTE — Progress Notes (Unsigned)
 IN- PERSON  THERAPIST PROGRESS NOTE  Session Time:  Wednesday 08/12/2024  2:10 PM -  3:00 PM   Participation Level: Active  Behavioral Response: CasualAlertAnxious  Type of Therapy: Individual Therapy  Treatment Goals addressed:  LTG:Pt will reduce anxiety AEB decreased episodes of worry from daily to 2 x per week for 30 days per pt's report.   Pt will identify/challenge/replace anxiety provoking thoughts, learn strategies to limit worry time   ProgressTowards Goals: Initial   Interventions: CBT and Supportive  Summary: Angela Norman is a 68 y.o. female who is a returning pt to this clinician and last was seen about 9 months ago.  Pt reports no psychiatric hospitalizations. She reports no previous involvement in therapy.  Patient reports needing to talk to someone as she has experienced several transitions in the past several months.  She incurred a spinal cord injury on September 05, 2023  As a result, she had surgery and participated in rehab for several months.  She reports becoming more anxious since her return home in April 2025.  She is experiencing loneliness.  She also expresses worry about her husband as he went into a deep depression when she was injured.  He just completed TMS and is slightly better.  Patient reports irritability, ruminating about the past, restlessness, worry about her family and the future.  Patient last was seen 2 weeks ago. She reports continued stress and anxiety along with continued loneliness.  She continues to worry about her husband continues to have issues related to depression. She also worries about their future as they age. Pt reports worry about a variety of other issues and states worrying daily. She continues to report loneliness and still expresses frustration regarding lack of contact from friends. She maintains contact with her children. She also has resumed participation in a book club but hasn't yet been able to attend the club's meeting due  to transportation issues. However, she is working on coordinating this with her husband. Pt completed therapy goals worksheet.  Suicidal/Homicidal: Nowithout intent/plan  Therapist Response: Reviewed symptoms, discussed stressors, facilitated expression of thoughts and feelings, validated feelings, praised patient's efforts to complete therapy goals worksheet, developed treatment plan, sent signature page and treatment plan to patient via MyChart, praised patient's initiative in contacting her for more book club, assisted patient identify and problem solve issues regarding transportation to book club, discussed rationale for and developed plan with patient to complete anxiety log in preparation for next session  diagnosis: Generalized anxiety disorder  Collaboration of Care: Other none needed at this session  Patient/Guardian was advised Release of Information must be obtained prior to any record release in order to collaborate their care with an outside provider. Patient/Guardian was advised if they have not already done so to contact the registration department to sign all necessary forms in order for us  to release information regarding their care.   Consent: Patient/Guardian gives verbal consent for treatment and assignment of benefits for services provided during this visit. Patient/Guardian expressed understanding and agreed to proceed.   Winton FORBES Rubinstein, LCSW 08/12/2024

## 2024-08-13 ENCOUNTER — Ambulatory Visit (HOSPITAL_COMMUNITY): Admitting: Occupational Therapy

## 2024-08-13 ENCOUNTER — Encounter (HOSPITAL_COMMUNITY): Payer: Self-pay | Admitting: Occupational Therapy

## 2024-08-13 DIAGNOSIS — G9520 Unspecified cord compression: Secondary | ICD-10-CM | POA: Diagnosis not present

## 2024-08-13 DIAGNOSIS — R278 Other lack of coordination: Secondary | ICD-10-CM

## 2024-08-13 DIAGNOSIS — R29818 Other symptoms and signs involving the nervous system: Secondary | ICD-10-CM

## 2024-08-13 NOTE — Therapy (Signed)
 OUTPATIENT OCCUPATIONAL THERAPY NEURO TREATMENT NOTE REASSESSMENT & RECERTIFICATION   Patient Name: Angela Norman MRN: [id number] DOB:Nov 13, 1955, 68 y.o., female Today's Date: 08/13/2024    PCP: Shona Norleen PEDLAR, MD REFERRING PROVIDER: Hillman Amour, MD   END OF SESSION:  OT End of Session - 08/13/24 1150     Visit Number 23    Number of Visits 29    Date for Recertification  09/12/24    Authorization Type Medicare Part A and B    Progress Note Due on Visit 26    OT Start Time 1112    OT Stop Time 1200    OT Time Calculation (min) 48 min    Activity Tolerance Patient tolerated treatment well    Behavior During Therapy WFL for tasks assessed/performed           Past Medical History:  Diagnosis Date   Anxiety    Arthritis    Basal cell carcinoma    Charcot's joint of foot    bilateral   Diabetes mellitus without complication (HCC)    Fibromyalgia    Neuropathy    Past Surgical History:  Procedure Laterality Date   ACHILLES TENDON REPAIR Left    bone spur Left    --left foot   BREAST SURGERY     benign breast bx--left--fatty tissue   CARPAL TUNNEL RELEASE Bilateral    COLONOSCOPY     COLONOSCOPY WITH PROPOFOL  N/A 09/06/2022   Procedure: COLONOSCOPY WITH PROPOFOL ;  Surgeon: Shaaron Lamar HERO, MD;  Location: [facility];  Service: Endoscopy;  Laterality: N/A;  10:30am, asa 2   DILATION AND CURETTAGE OF UTERUS  2011   for cervical polyp   FOOT SURGERY Left 03/2018   reconstructive surgery   POLYPECTOMY  09/06/2022   Procedure: POLYPECTOMY;  Surgeon: Shaaron Lamar HERO, MD;  Location: [facility];  Service: Endoscopy;;   Patient Active Problem List   Diagnosis Date Noted   Anxiety 06/28/2022   Arthritis 06/28/2022   Body fluid retention 06/28/2022   Hyperglycemia due to type 2 diabetes mellitus (HCC) 06/28/2022   Mixed hyperlipidemia 06/28/2022   Fibromyalgia 06/28/2022   Cough 10/25/2021   Musculoskeletal pain 05/21/2012   Stiffness of joint,  not elsewhere classified, ankle and foot 08/13/2011   Difficulty walking 08/13/2011   Ankle weakness 08/13/2011    ONSET DATE: 08/2023  REFERRING DIAG:  G95.20 (ICD-10-CM) - Unspecified cord compression  R25.2 (ICD-10-CM) - Spastic   THERAPY DIAG:  Other lack of coordination  Other symptoms and signs involving the nervous system  Rationale for Evaluation and Treatment: Rehabilitation  SUBJECTIVE:   SUBJECTIVE STATEMENT: S: I had a hard time on Tuesday  PERTINENT HISTORY: Pt reported all this started with UTI that caused fracture f neck and indicated as C4-C7 fusion. Pt had multiple bouts of acute and inpatient rehab stays due to complications. Significant BP issues throughout this time. Had stay in multiple facilities. Pt reported various changes in functional status, was requiring assist for ADLs and mobility since April 28th. Since then, pt has been home with Home Health services. Pt continues to report numbness in BUE and poor grip strength. Pt reports neuropathy in BLE. Prior to onset of November '24 was walking daily for 45-60 minutes.   PRECAUTIONS: Fall  WEIGHT BEARING RESTRICTIONS: No  PAIN:  Are you having pain? Yes: NPRS scale: 4/10 Pain location: my body Pain description: sore to the touch Aggravating factors: overuse, poor sleep Relieving factors: pain cream  FALLS: Has patient  fallen in last 6 months? No  LIVING ENVIRONMENT: Lives with: lives with their spouse Lives in: House/apartment Stairs: Pt lives in the basement with no stairs Has following equipment at home: Vannie - 2 wheeled and shower chair  PLOF: Independent  PATIENT GOALS: To improve mobility and strengthening  OBJECTIVE:  Note: Objective measures were completed at Evaluation unless otherwise noted.  HAND DOMINANCE: Right  ADLs: Overall ADLs: Pt has difficulty with all small object manipulation, including but not limited to buttons, shoe laces, and zippers. Additionally she has max  difficulty with cooking and cleaning due to weakness in both hands and wrists.   MOBILITY STATUS: Hx of falls, difficulty with turns, and difficulty carrying objections with ambulation  POSTURE COMMENTS:  rounded shoulders Sitting balance: Moves/returns truncal midpoint 1-2 inches in multiple planes  ACTIVITY TOLERANCE: Activity tolerance: Fair tolerance - fatigues quicker than normal  FUNCTIONAL OUTCOME MEASURES: Quick Dash: 47.73 07/16/24: 20.45 08/13/24: 34.09  UPPER EXTREMITY ROM:    All ROM is [facility]  UPPER EXTREMITY MMT:     MMT Right eval Left eval Right 07/16/24 Left 07/16/24 Right 08/13/24 Left 08/13/24  Shoulder flexion 5/5 5/5 5/5 5/5 5/5 5/5  Shoulder abduction 5/5 5/5 5/5 4/5 4+/5 4+/5  Shoulder internal rotation 5/5 5/5 5/5 5/5 5/5 5/5  Shoulder external rotation 5/5 5/5 4+/5 4+/5 5/5 4+/5  Elbow flexion 5/5 5/5 5/5 5/5 5/5 5/5  Elbow extension 5/5 5/5 4+/5 4+/5 4+/5 4+/5  Wrist flexion 4/5 4+/5 4+/5 5/5 4+/5 5/5  Wrist extension 4-/5 4/5 5/5 5/5 5/5 5/5  Wrist ulnar deviation 4-/5 4/5 4/5 4+/5 4+/5 5/5  Wrist radial deviation 4-/5 4-/5 5/5 5/5 5/5 5/5  Wrist pronation 4/5 4+/5 5/5 5/5 4/5 5/5  Wrist supination 4/5 4/5 4+/5 4+/5 4+/5 5/5  (Blank rows = not tested)  HAND FUNCTION: Grip strength: Right: 20 lbs; Left: 27 lbs, Lateral pinch: Right: 5 lbs, Left: 9 lbs, and 3 point pinch: Right: 5 lbs, Left: 4 lbs 07/16/24: Grip strength: Right: 31 lbs; Left: 35 lbs, Lateral pinch: Right: 7 lbs, Left: 8 lbs, and 3 point pinch: Right: 7 lbs, Left: 9 lbs 08/13/24: Grip strength: Right: 30 lbs; Left: 44 lbs, Lateral pinch: Right: 6 lbs, Left: 8 lbs, and 3 point pinch: Right: 8 lbs, Left: 8 lbs  COORDINATION: 06/30/24-9 Hole Peg test: Right: 33.28 sec; Left: 38.26 sec 07/16/24- 9 Hole Peg test: Right: 52.15 sec; Left: 49.88 sec 08/13/24: 9 Hole Peg test: Right: 51.00 sec; Left: 38.09 sec  SENSATION: Pt has full numbness along median nerve of R hand as well as dulled  sensation and tingling in BUE up to elbows.   EDEMA: No swelling noted  OBSERVATIONS: adducted thumbs and 80% grip                                                                                                                             TREATMENT DATE:  08/13/24 -Tendon Glides: right hand, 10  reps -Towel crumple: 10 reps with right hand -Finger taps: 10 reps each with right hand -Chip manipulation: pt holding 6 sequence chips in right hand, working on palm to fingertip translation to move to fingertips and place on towel. Pt then picking up one at a time and moving to palm. Completed 2 rounds. -Extensive discussion on pt home set up and plan to move upstairs this week. Discussed bathroom set up, recommendations of grab bars and positioning for success with various tasks. Also discussed navigating the entrance of the home.    08/11/24 -Mariette: teal, pronation and supination bends, ulnar and radial bends, flexion and extension twists, x12 -Hand gripper: RUE 35# medium beads, LUE 25# medium beads -Coins: flipping 10 coins and holding all of them and placing in the piggy bank  08/06/24 -Myofascial release to left anterior shoulder to decrease pain and fascial restrictions and increase functional use of LUE during ADLs -Shoulder Strengthening: 2#, protraction, horizontal abduction, flexion, abduction, er/IR, x12 -Strengthening: 2#, bicep curls, tricep kick backs, pronated curls, hammer curls, x12 -Theraputty: red, BUE, roll into ball, flatten into pancake, PVC pipe to cut circles, roll into log, tripod pinch, lateral pinch, roll into ball and squeeze    PATIENT EDUCATION: Education details: Continue HEP Person educated: Patient Education method: Programmer, multimedia, Demonstration, and Handouts Education comprehension: verbalized understanding and returned demonstration  HOME EXERCISE PROGRAM: 8/1: Wrist ROM and Digit ROM 8/8: Shoulder A/ROM 8/20: Scapular Strengthening 9/4: Coin  work 9/18: theraband wrist strengthening-red 10/14: PNF Strengthening    GOALS: Goals reviewed with patient? Yes  SHORT TERM GOALS: Target date: 06/19/24  Pt will be provided and educated on a comprehensive HEP for BUE mobility in order to complete ADL's and IADL's independently.   Goal status: IN PROGRESS  2.  Pt will improve BUE strength to 5/5 in order to lift and carry items during cooking and cleaning tasks.   Goal status: Partially Met  3.  Pt will improve RUE grip by 15# and pinch by 3#; and LUE grip by 20# and pinch by 5# in order to grasp and hold pots and pans.  Goal status: IN PROGRESS  4.  Pt will improve RUE coordination by completing 9 hole peg test in 45 or less in order to manipulate and complete fine motor tasks.   Goal status: Revised  5. Pt will be educated on AE available to use during ADLs and compensatory strategies to improve safety, independence, and success with functional tasks.     Goal status: New    6. Pt will decrease pain in BUE to improve ability to sleep for 2+ consecutive hours without waking due to pain.       Goal status: New   ASSESSMENT:  CLINICAL IMPRESSION: Reassessment completed this session. Pt is meeting goals for the LUE but continues to struggle with the RUE. Pt has met one goal for coordination which has been revised to RUE only. Pt also consistently reports pain in her LUE and bilateral trapezius/shoulder regions, pain goal has been added. Also added goal for AE education for improved success with ADLs. Pt reports she feels like she is improving with ADLs such as bathing, dressing, and grooming tasks, however continues to struggle with using RUE as dominant and often has to switch to the LUE due to weakness and fatigue. Pt will benefit from continued skilled OT services to improve function of BUE, safety and independence in ADL and functional task completion. Recommend 4 additional weeks with heavy focus on the  RUE and task  adaptation for success.    PERFORMANCE DEFICITS: in functional skills including ADLs, IADLs, coordination, dexterity, sensation, ROM, strength, pain, fascial restrictions, Fine motor control, Gross motor control, body mechanics, and UE functional use.    PLAN:  OT FREQUENCY: 2x/week  OT DURATION: 4 weeks  PLANNED INTERVENTIONS: 97168 OT Re-evaluation, 97535 self care/ADL training, 02889 therapeutic exercise, 97530 therapeutic activity, 97112 neuromuscular re-education, 97140 manual therapy, 97035 ultrasound, 97018 paraffin, 02989 moist heat, 97032 electrical stimulation (manual), passive range of motion, functional mobility training, energy conservation, coping strategies training, patient/family education, and DME and/or AE instructions  CONSULTED AND AGREED WITH PLAN OF CARE: Patient  PLAN FOR NEXT SESSION: Wrist and Digit ROM, strengthening, coordination tasks   Sonny Cory, OTR/L  [phone] 08/13/2024, 12:04 PM

## 2024-08-18 ENCOUNTER — Encounter (HOSPITAL_COMMUNITY): Payer: Self-pay | Admitting: Occupational Therapy

## 2024-08-18 ENCOUNTER — Ambulatory Visit (HOSPITAL_COMMUNITY): Admitting: Occupational Therapy

## 2024-08-18 DIAGNOSIS — G9520 Unspecified cord compression: Secondary | ICD-10-CM | POA: Diagnosis not present

## 2024-08-18 DIAGNOSIS — R278 Other lack of coordination: Secondary | ICD-10-CM

## 2024-08-18 DIAGNOSIS — R29818 Other symptoms and signs involving the nervous system: Secondary | ICD-10-CM

## 2024-08-18 NOTE — Therapy (Unsigned)
 OUTPATIENT OCCUPATIONAL THERAPY NEURO TREATMENT NOTE REASSESSMENT & RECERTIFICATION   Patient Name: Angela Norman MRN: [id number] DOB:05/13/1956, 68 y.o., female Today's Date: 08/18/2024    PCP: Shona Norleen PEDLAR, MD REFERRING PROVIDER: Hillman Amour, MD   END OF SESSION:     Past Medical History:  Diagnosis Date   Anxiety    Arthritis    Basal cell carcinoma    Charcot's joint of foot    bilateral   Diabetes mellitus without complication (HCC)    Fibromyalgia    Neuropathy    Past Surgical History:  Procedure Laterality Date   ACHILLES TENDON REPAIR Left    bone spur Left    --left foot   BREAST SURGERY     benign breast bx--left--fatty tissue   CARPAL TUNNEL RELEASE Bilateral    COLONOSCOPY     COLONOSCOPY WITH PROPOFOL  N/A 09/06/2022   Procedure: COLONOSCOPY WITH PROPOFOL ;  Surgeon: Shaaron Lamar HERO, MD;  Location: [facility];  Service: Endoscopy;  Laterality: N/A;  10:30am, asa 2   DILATION AND CURETTAGE OF UTERUS  2011   for cervical polyp   FOOT SURGERY Left 03/2018   reconstructive surgery   POLYPECTOMY  09/06/2022   Procedure: POLYPECTOMY;  Surgeon: Shaaron Lamar HERO, MD;  Location: [facility];  Service: Endoscopy;;   Patient Active Problem List   Diagnosis Date Noted   Anxiety 06/28/2022   Arthritis 06/28/2022   Body fluid retention 06/28/2022   Hyperglycemia due to type 2 diabetes mellitus (HCC) 06/28/2022   Mixed hyperlipidemia 06/28/2022   Fibromyalgia 06/28/2022   Cough 10/25/2021   Musculoskeletal pain 05/21/2012   Stiffness of joint, not elsewhere classified, ankle and foot 08/13/2011   Difficulty walking 08/13/2011   Ankle weakness 08/13/2011    ONSET DATE: 08/2023  REFERRING DIAG:  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  Rationale for Evaluation and Treatment: Rehabilitation  SUBJECTIVE:   SUBJECTIVE STATEMENT: S: I had a hard time on  Tuesday  PERTINENT HISTORY: Pt reported all this started with UTI that caused fracture f neck and indicated as C4-C7 fusion. Pt had multiple bouts of acute and inpatient rehab stays due to complications. Significant BP issues throughout this time. Had stay in multiple facilities. Pt reported various changes in functional status, was requiring assist for ADLs and mobility since April 28th. Since then, pt has been home with Home Health services. Pt continues to report numbness in BUE and poor grip strength. Pt reports neuropathy in BLE. Prior to onset of November '24 was walking daily for 45-60 minutes.   PRECAUTIONS: Fall  WEIGHT BEARING RESTRICTIONS: No  PAIN:  Are you having pain? Yes: NPRS scale: 4/10 Pain location: my body Pain description: sore to the touch Aggravating factors: overuse, poor sleep Relieving factors: pain cream  FALLS: Has patient fallen in last 6 months? No  LIVING ENVIRONMENT: Lives with: lives with their spouse Lives in: House/apartment Stairs: Pt lives in the basement with no stairs Has following equipment at home: Vannie - 2 wheeled and shower chair  PLOF: Independent  PATIENT GOALS: To improve mobility and strengthening  OBJECTIVE:  Note: Objective measures were completed at Evaluation unless otherwise noted.  HAND DOMINANCE: Right  ADLs: Overall ADLs: Pt has difficulty with all small object manipulation, including but not limited to buttons, shoe laces, and zippers. Additionally she has max difficulty with cooking and cleaning due to weakness in both hands and wrists.  MOBILITY STATUS: Hx of falls, difficulty with turns, and difficulty carrying objections with ambulation  POSTURE COMMENTS:  rounded shoulders Sitting balance: Moves/returns truncal midpoint 1-2 inches in multiple planes  ACTIVITY TOLERANCE: Activity tolerance: Fair tolerance - fatigues quicker than normal  FUNCTIONAL OUTCOME MEASURES: Quick Dash: 47.73 07/16/24: 20.45 08/13/24:  34.09  UPPER EXTREMITY ROM:    All ROM is [facility]  UPPER EXTREMITY MMT:     MMT Right eval Left eval Right 07/16/24 Left 07/16/24 Right 08/13/24 Left 08/13/24  Shoulder flexion 5/5 5/5 5/5 5/5 5/5 5/5  Shoulder abduction 5/5 5/5 5/5 4/5 4+/5 4+/5  Shoulder internal rotation 5/5 5/5 5/5 5/5 5/5 5/5  Shoulder external rotation 5/5 5/5 4+/5 4+/5 5/5 4+/5  Elbow flexion 5/5 5/5 5/5 5/5 5/5 5/5  Elbow extension 5/5 5/5 4+/5 4+/5 4+/5 4+/5  Wrist flexion 4/5 4+/5 4+/5 5/5 4+/5 5/5  Wrist extension 4-/5 4/5 5/5 5/5 5/5 5/5  Wrist ulnar deviation 4-/5 4/5 4/5 4+/5 4+/5 5/5  Wrist radial deviation 4-/5 4-/5 5/5 5/5 5/5 5/5  Wrist pronation 4/5 4+/5 5/5 5/5 4/5 5/5  Wrist supination 4/5 4/5 4+/5 4+/5 4+/5 5/5  (Blank rows = not tested)  HAND FUNCTION: Grip strength: Right: 20 lbs; Left: 27 lbs, Lateral pinch: Right: 5 lbs, Left: 9 lbs, and 3 point pinch: Right: 5 lbs, Left: 4 lbs 07/16/24: Grip strength: Right: 31 lbs; Left: 35 lbs, Lateral pinch: Right: 7 lbs, Left: 8 lbs, and 3 point pinch: Right: 7 lbs, Left: 9 lbs 08/13/24: Grip strength: Right: 30 lbs; Left: 44 lbs, Lateral pinch: Right: 6 lbs, Left: 8 lbs, and 3 point pinch: Right: 8 lbs, Left: 8 lbs  COORDINATION: 06/30/24-9 Hole Peg test: Right: 33.28 sec; Left: 38.26 sec 07/16/24- 9 Hole Peg test: Right: 52.15 sec; Left: 49.88 sec 08/13/24: 9 Hole Peg test: Right: 51.00 sec; Left: 38.09 sec  SENSATION: Pt has full numbness along median nerve of R hand as well as dulled sensation and tingling in BUE up to elbows.   EDEMA: No swelling noted  OBSERVATIONS: adducted thumbs and 80% grip                                                                                                                             TREATMENT DATE:  08/18/24 -Strengthening: 3#, protraction, flexion, abduction, er/ir, horizontal abduction, bicep curls, x15 -Wrist Strengthening: 3#, flexion, extension, ulnar/radial deviation, supination/pronation,  x15 -Tendon Glides: BUE, 10 reps -Pinch Strengthening: red clip, 2 stacks of 5 cubes, green clips, 2 stacks of 5 cubes, BUE  08/13/24 -Tendon Glides: right hand, 10 reps -Towel crumple: 10 reps with right hand -Finger taps: 10 reps each with right hand -Chip manipulation: pt holding 6 sequence chips in right hand, working on palm to fingertip translation to move to fingertips and place on towel. Pt then picking up one at a time and moving to palm. Completed 2 rounds. -Extensive discussion on pt home set up and plan  to move upstairs this week. Discussed bathroom set up, recommendations of grab bars and positioning for success with various tasks. Also discussed navigating the entrance of the home.    08/11/24 -Mariette: teal, pronation and supination bends, ulnar and radial bends, flexion and extension twists, x12 -Hand gripper: RUE 35# medium beads, LUE 25# medium beads -Coins: flipping 10 coins and holding all of them and placing in the piggy bank  08/06/24 -Myofascial release to left anterior shoulder to decrease pain and fascial restrictions and increase functional use of LUE during ADLs -Shoulder Strengthening: 2#, protraction, horizontal abduction, flexion, abduction, er/IR, x12 -Strengthening: 2#, bicep curls, tricep kick backs, pronated curls, hammer curls, x12 -Theraputty: red, BUE, roll into ball, flatten into pancake, PVC pipe to cut circles, roll into log, tripod pinch, lateral pinch, roll into ball and squeeze    PATIENT EDUCATION: Education details: Continue HEP Person educated: Patient Education method: Programmer, Multimedia, Demonstration, and Handouts Education comprehension: verbalized understanding and returned demonstration  HOME EXERCISE PROGRAM: 8/1: Wrist ROM and Digit ROM 8/8: Shoulder A/ROM 8/20: Scapular Strengthening 9/4: Coin work 9/18: theraband wrist strengthening-red 10/14: PNF Strengthening    GOALS: Goals reviewed with patient? Yes  SHORT TERM GOALS:  Target date: 06/19/24  Pt will be provided and educated on a comprehensive HEP for BUE mobility in order to complete ADL's and IADL's independently.   Goal status: IN PROGRESS  2.  Pt will improve BUE strength to 5/5 in order to lift and carry items during cooking and cleaning tasks.   Goal status: Partially Met  3.  Pt will improve RUE grip by 15# and pinch by 3#; and LUE grip by 20# and pinch by 5# in order to grasp and hold pots and pans.  Goal status: IN PROGRESS  4.  Pt will improve RUE coordination by completing 9 hole peg test in 45 or less in order to manipulate and complete fine motor tasks.   Goal status: Revised  5. Pt will be educated on AE available to use during ADLs and compensatory strategies to improve safety, independence, and success with functional tasks.     Goal status: New    6. Pt will decrease pain in BUE to improve ability to sleep for 2+ consecutive hours without waking due to pain.       Goal status: New   ASSESSMENT:  CLINICAL IMPRESSION: Reassessment completed this session. Pt is meeting goals for the LUE but continues to struggle with the RUE. Pt has met one goal for coordination which has been revised to RUE only. Pt also consistently reports pain in her LUE and bilateral trapezius/shoulder regions, pain goal has been added. Also added goal for AE education for improved success with ADLs. Pt reports she feels like she is improving with ADLs such as bathing, dressing, and grooming tasks, however continues to struggle with using RUE as dominant and often has to switch to the LUE due to weakness and fatigue. Pt will benefit from continued skilled OT services to improve function of BUE, safety and independence in ADL and functional task completion. Recommend 4 additional weeks with heavy focus on the RUE and task adaptation for success.    PERFORMANCE DEFICITS: in functional skills including ADLs, IADLs, coordination, dexterity, sensation, ROM, strength,  pain, fascial restrictions, Fine motor control, Gross motor control, body mechanics, and UE functional use.    PLAN:  OT FREQUENCY: 2x/week  OT DURATION: 4 weeks  PLANNED INTERVENTIONS: 97168 OT Re-evaluation, 97535 self care/ADL training, 02889 therapeutic exercise,  97530 therapeutic activity, 97112 neuromuscular re-education, 97140 manual therapy, 97035 ultrasound, 02981 paraffin, 97010 moist heat, 97032 electrical stimulation (manual), passive range of motion, functional mobility training, energy conservation, coping strategies training, patient/family education, and DME and/or AE instructions  CONSULTED AND AGREED WITH PLAN OF CARE: Patient  PLAN FOR NEXT SESSION: Wrist and Digit ROM, strengthening, coordination tasks   Valentin Nightingale, OTR/L [phone] 08/18/2024, 1:13 PM

## 2024-08-20 ENCOUNTER — Encounter (HOSPITAL_COMMUNITY): Payer: Self-pay

## 2024-08-20 ENCOUNTER — Ambulatory Visit (HOSPITAL_COMMUNITY)

## 2024-08-20 ENCOUNTER — Encounter (HOSPITAL_COMMUNITY): Payer: Self-pay | Admitting: Occupational Therapy

## 2024-08-20 ENCOUNTER — Ambulatory Visit (HOSPITAL_COMMUNITY): Admitting: Occupational Therapy

## 2024-08-20 DIAGNOSIS — M6281 Muscle weakness (generalized): Secondary | ICD-10-CM

## 2024-08-20 DIAGNOSIS — Z7409 Other reduced mobility: Secondary | ICD-10-CM

## 2024-08-20 DIAGNOSIS — R29818 Other symptoms and signs involving the nervous system: Secondary | ICD-10-CM

## 2024-08-20 DIAGNOSIS — R278 Other lack of coordination: Secondary | ICD-10-CM

## 2024-08-20 DIAGNOSIS — G9520 Unspecified cord compression: Secondary | ICD-10-CM

## 2024-08-20 NOTE — Therapy (Signed)
 OUTPATIENT OCCUPATIONAL THERAPY NEURO TREATMENT NOTE   Patient Name: Angela Norman MRN: [id number] DOB:1956-09-26, 68 y.o., female Today's Date: 08/20/2024    PCP: Shona Norleen PEDLAR, MD REFERRING PROVIDER: Hillman Amour, MD   END OF SESSION:  OT End of Session - 08/20/24 1351     Visit Number 25    Number of Visits 29    Date for Recertification  09/12/24    Authorization Type Medicare Part A and B    Progress Note Due on Visit 26    OT Start Time 1306    OT Stop Time 1350    OT Time Calculation (min) 44 min    Activity Tolerance Patient tolerated treatment well    Behavior During Therapy WFL for tasks assessed/performed          Past Medical History:  Diagnosis Date   Anxiety    Arthritis    Basal cell carcinoma    Charcot's joint of foot    bilateral   Diabetes mellitus without complication (HCC)    Fibromyalgia    Neuropathy    Past Surgical History:  Procedure Laterality Date   ACHILLES TENDON REPAIR Left    bone spur Left    --left foot   BREAST SURGERY     benign breast bx--left--fatty tissue   CARPAL TUNNEL RELEASE Bilateral    COLONOSCOPY     COLONOSCOPY WITH PROPOFOL  N/A 09/06/2022   Procedure: COLONOSCOPY WITH PROPOFOL ;  Surgeon: Shaaron Lamar HERO, MD;  Location: [facility];  Service: Endoscopy;  Laterality: N/A;  10:30am, asa 2   DILATION AND CURETTAGE OF UTERUS  2011   for cervical polyp   FOOT SURGERY Left 03/2018   reconstructive surgery   POLYPECTOMY  09/06/2022   Procedure: POLYPECTOMY;  Surgeon: Shaaron Lamar HERO, MD;  [facility]: [facility];  Service: Endoscopy;;   Patient Active Problem List   Diagnosis Date Noted   Anxiety 06/28/2022   Arthritis 06/28/2022   Body fluid retention 06/28/2022   Hyperglycemia due to type 2 diabetes mellitus (HCC) 06/28/2022   Mixed hyperlipidemia 06/28/2022   Fibromyalgia 06/28/2022   Cough 10/25/2021   Musculoskeletal pain 05/21/2012   Stiffness of joint, not elsewhere classified, ankle and  foot 08/13/2011   Difficulty walking 08/13/2011   Ankle weakness 08/13/2011    ONSET DATE: 08/2023  REFERRING DIAG:  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 Pt had multiple bouts of acute and inpatient rehab stays due to complications. Significant BP issues throughout this time. Had stay in multiple facilities. Pt reported various changes in functional status, was requiring assist for ADLs and mobility since April 28th. Since then, pt has been home with Home Health services. Pt continues to report numbness in BUE and poor grip strength. Pt reports neuropathy in BLE. Prior to onset of November '24 was walking daily for 45-60 minutes.   PRECAUTIONS: Fall  WEIGHT BEARING RESTRICTIONS: No  PAIN:  Are you having pain? Yes: NPRS scale: 4/10 Pain location: my body Pain description: sore to the touch Aggravating factors: overuse, poor sleep Relieving factors: pain cream  FALLS: Has patient fallen in last  6 months? No  LIVING ENVIRONMENT: Lives with: lives with their spouse Lives in: House/apartment Stairs: Pt lives in the basement with no stairs Has following equipment at home: Vannie - 2 wheeled and shower chair  PLOF: Independent  PATIENT GOALS: To improve mobility and strengthening  OBJECTIVE:  Note: Objective measures were completed at Evaluation unless otherwise noted.  HAND DOMINANCE: Right  ADLs: Overall ADLs: Pt has difficulty with all small object manipulation, including but not limited to buttons, shoe laces, and zippers. Additionally she has max difficulty with cooking and  cleaning due to weakness in both hands and wrists.   MOBILITY STATUS: Hx of falls, difficulty with turns, and difficulty carrying objections with ambulation  POSTURE COMMENTS:  rounded shoulders Sitting balance: Moves/returns truncal midpoint 1-2 inches in multiple planes  ACTIVITY TOLERANCE: Activity tolerance: Fair tolerance - fatigues quicker than normal  FUNCTIONAL OUTCOME MEASURES: Quick Dash: 47.73 07/16/24: 20.45 08/13/24: 34.09  UPPER EXTREMITY ROM:    All ROM is [facility]  UPPER EXTREMITY MMT:     MMT Right eval Left eval Right 07/16/24 Left 07/16/24 Right 08/13/24 Left 08/13/24  Shoulder flexion 5/5 5/5 5/5 5/5 5/5 5/5  Shoulder abduction 5/5 5/5 5/5 4/5 4+/5 4+/5  Shoulder internal rotation 5/5 5/5 5/5 5/5 5/5 5/5  Shoulder external rotation 5/5 5/5 4+/5 4+/5 5/5 4+/5  Elbow flexion 5/5 5/5 5/5 5/5 5/5 5/5  Elbow extension 5/5 5/5 4+/5 4+/5 4+/5 4+/5  Wrist flexion 4/5 4+/5 4+/5 5/5 4+/5 5/5  Wrist extension 4-/5 4/5 5/5 5/5 5/5 5/5  Wrist ulnar deviation 4-/5 4/5 4/5 4+/5 4+/5 5/5  Wrist radial deviation 4-/5 4-/5 5/5 5/5 5/5 5/5  Wrist pronation 4/5 4+/5 5/5 5/5 4/5 5/5  Wrist supination 4/5 4/5 4+/5 4+/5 4+/5 5/5  (Blank rows = not tested)  HAND FUNCTION: Grip strength: Right: 20 lbs; Left: 27 lbs, Lateral pinch: Right: 5 lbs, Left: 9 lbs, and 3 point pinch: Right: 5 lbs, Left: 4 lbs 07/16/24: Grip strength: Right: 31 lbs; Left: 35 lbs, Lateral pinch: Right: 7 lbs, Left: 8 lbs, and 3 point pinch: Right: 7 lbs, Left: 9 lbs 08/13/24: Grip strength: Right: 30 lbs; Left: 44 lbs, Lateral pinch: Right: 6 lbs, Left: 8 lbs, and 3 point pinch: Right: 8 lbs, Left: 8 lbs  COORDINATION: 06/30/24-9 Hole Peg test: Right: 33.28 sec; Left: 38.26 sec 07/16/24- 9 Hole Peg test: Right: 52.15 sec; Left: 49.88 sec 08/13/24: 9 Hole Peg test: Right: 51.00 sec; Left: 38.09 sec  SENSATION: Pt has full numbness along median nerve of R hand as well as dulled sensation and tingling in BUE  up to elbows.   EDEMA: No swelling noted  OBSERVATIONS: adducted thumbs and 80% grip                                                                                                                             TREATMENT DATE:  08/20/24 -Strengthening: 3#, protraction, flexion, abduction, er/ir, horizontal abduction,  bicep curls, x15 -Theraputty: red, roll into ball, flatten into pancake, PVC pipe pull, roll into log, tripod pinch, lateral pinch, roll into ball, squeeze  -Pinch tree: red, green, blue resistance clips -Tiny peg board with pattern, using tweezers  08/18/24 -Strengthening: 3#, protraction, flexion, abduction, er/ir, horizontal abduction, bicep curls, x15 -Wrist Strengthening: 3#, flexion, extension, ulnar/radial deviation, supination/pronation, x15 -Tendon Glides: BUE, 10 reps -Pinch Strengthening: red clip, 2 stacks of 5 cubes, green clips, 2 stacks of 5 cubes, BUE  08/13/24 -Tendon Glides: right hand, 10 reps -Towel crumple: 10 reps with right hand -Finger taps: 10 reps each with right hand -Chip manipulation: pt holding 6 sequence chips in right hand, working on palm to fingertip translation to move to fingertips and place on towel. Pt then picking up one at a time and moving to palm. Completed 2 rounds. -Extensive discussion on pt home set up and plan to move upstairs this week. Discussed bathroom set up, recommendations of grab bars and positioning for success with various tasks. Also discussed navigating the entrance of the home.    08/11/24 -Mariette: teal, pronation and supination bends, ulnar and radial bends, flexion and extension twists, x12 -Hand gripper: RUE 35# medium beads, LUE 25# medium beads -Coins: flipping 10 coins and holding all of them and placing in the piggy bank   PATIENT EDUCATION: Education details: Continue HEP Person educated: Patient Education method: Programmer, Multimedia, Demonstration, and Handouts Education comprehension: verbalized  understanding and returned demonstration  HOME EXERCISE PROGRAM: 8/1: Wrist ROM and Digit ROM 8/8: Shoulder A/ROM 8/20: Scapular Strengthening 9/4: Coin work 9/18: theraband wrist strengthening-red 10/14: PNF Strengthening    GOALS: Goals reviewed with patient? Yes  SHORT TERM GOALS: Target date: 06/19/24  Pt will be provided and educated on a comprehensive HEP for BUE mobility in order to complete ADL's and IADL's independently.   Goal status: IN PROGRESS  2.  Pt will improve BUE strength to 5/5 in order to lift and carry items during cooking and cleaning tasks.   Goal status: Partially Met  3.  Pt will improve RUE grip by 15# and pinch by 3#; and LUE grip by 20# and pinch by 5# in order to grasp and hold pots and pans.  Goal status: IN PROGRESS  4.  Pt will improve RUE coordination by completing 9 hole peg test in 45 or less in order to manipulate and complete fine motor tasks.   Goal status: Revised  5. Pt will be educated on AE available to use during ADLs and compensatory strategies to improve safety, independence, and success with functional tasks.     Goal status: New    6. Pt will decrease pain in BUE to improve ability to sleep for 2+ consecutive hours without waking due to pain.       Goal status: New   ASSESSMENT:  CLINICAL IMPRESSION: Pt reported moderate to severe pain/soreness last night, which stopped her from sleeping and she needed a muscle relaxer to take the pain down. This session she reported no pain, however did demonstrate mild fatigue requiring multiple rest breaks when working her shoulder. Pt also demonstrated good improvements in her overall coordination, even with utilizing pin nose tweezers. OT providing verbal and tactile cuing for positioning and technique throughout session.   PERFORMANCE DEFICITS: in functional skills including ADLs, IADLs, coordination, dexterity, sensation, ROM, strength, pain, fascial restrictions, Fine motor control,  Gross motor control, body mechanics, and UE functional use.    PLAN:  OT FREQUENCY: 2x/week  OT DURATION: 4 weeks  PLANNED INTERVENTIONS: 97168 OT Re-evaluation, 97535 self care/ADL training, 02889 therapeutic exercise, 97530 therapeutic activity, 97112 neuromuscular re-education, 97140 manual therapy, 97035 ultrasound, 97018 paraffin, 02989 moist heat, 97032 electrical stimulation (manual), passive range of motion, functional mobility training, energy conservation, coping strategies training, patient/family education, and DME and/or AE instructions  CONSULTED AND AGREED WITH PLAN OF CARE: Patient  PLAN FOR NEXT SESSION: Wrist and Digit ROM, strengthening, coordination tasks   Valentin Nightingale, OTR/L [phone] 08/20/2024, 1:52 PM

## 2024-08-20 NOTE — Therapy (Signed)
 OUTPATIENT PHYSICAL THERAPY NEURO TREATMENT   Patient Name: Angela Norman MRN: [id number] DOB:04/21/1956, 68 y.o., female Today's Date: 08/20/2024     END OF SESSION:  PT End of Session - 08/20/24 1415     Visit Number 23    Number of Visits 28    Date for Recertification  08/28/24    Authorization Type Medicare A & B    Progress Note Due on Visit 28    PT Start Time 1415    PT Stop Time 1455    PT Time Calculation (min) 40 min    Equipment Utilized During Treatment Gait belt    Activity Tolerance Patient tolerated treatment well    Behavior During Therapy WFL for tasks assessed/performed                 Past Medical History:  Diagnosis Date   Anxiety    Arthritis    Basal cell carcinoma    Charcot's joint of foot    bilateral   Diabetes mellitus without complication (HCC)    Fibromyalgia    Neuropathy    Past Surgical History:  Procedure Laterality Date   ACHILLES TENDON REPAIR Left    bone spur Left    --left foot   BREAST SURGERY     benign breast bx--left--fatty tissue   CARPAL TUNNEL RELEASE Bilateral    COLONOSCOPY     COLONOSCOPY WITH PROPOFOL  N/A 09/06/2022   Procedure: COLONOSCOPY WITH PROPOFOL ;  Surgeon: Shaaron Lamar HERO, MD;  Location: [facility];  Service: Endoscopy;  Laterality: N/A;  10:30am, asa 2   DILATION AND CURETTAGE OF UTERUS  2011   for cervical polyp   FOOT SURGERY Left 03/2018   reconstructive surgery   POLYPECTOMY  09/06/2022   Procedure: POLYPECTOMY;  Surgeon: Shaaron Lamar HERO, MD;  Location: [facility];  Service: Endoscopy;;   Patient Active Problem List   Diagnosis Date Noted   Anxiety 06/28/2022   Arthritis 06/28/2022   Body fluid retention 06/28/2022   Hyperglycemia due to type 2 diabetes mellitus (HCC) 06/28/2022   Mixed hyperlipidemia 06/28/2022   Fibromyalgia 06/28/2022   Cough 10/25/2021   Musculoskeletal pain 05/21/2012   Stiffness of joint, not elsewhere classified, ankle and foot 08/13/2011    Difficulty walking 08/13/2011   Ankle weakness 08/13/2011   PCP: Sebastian Othel GAILS, FNP REFERRING PROVIDER: Edna Norris, MD ONSET DATE: November 2024  REFERRING DIAG: compression of spinal cord   THERAPY DIAG:  Other lack of coordination  Other symptoms and signs involving the nervous system  Compression of spinal cord (HCC)  Impaired functional mobility, balance, gait, and endurance  Muscle weakness (generalized)  Rationale for Evaluation and Treatment: Rehabilitation  SUBJECTIVE:  SUBJECTIVE STATEMENT: Reports her whole body ached last night.  Currently has pain Bil shoulders pain scale 2/10 today.  Reports plans to move upstairs over weekend.  Plans for walk in shower maybe in the next month.  Eval:  Pt reported all this started with UTI that caused fracture of neck and indicated as C4-C7 fusion. Pt had multiple bouts of acute and inpatient rehab stays due to complications. Significant BP issues throughout this time. Had stay in multiple facilities. Pt reported various changes in functional status, was requiring assist for ADLs and mobility since April 28th. Since then, pt has been home with Home Health services. Pt continues to report numbness in BUE and poor grip strength. Pt reports neuropathy in BLE. Prior to onset of November '24 was walking daily for 45-60 minutes.   Pt has CT scan for nodule on Lung on 04/28/24.  Pt accompanied by: self  PERTINENT HISTORY:  Cervical Myelopathy with ACDF 4-7 in November 2024\ Left ankle surgery x 3  PAIN:  Are you having pain? No and chronic pain in left shoulder  PRECAUTIONS: None  RED FLAGS: Bowel or bladder incontinence: Yes: SCI   WEIGHT BEARING RESTRICTIONS: No  FALLS: Has patient fallen in last 6 months? Yes. Number of falls  1-2   PATIENT GOALS:  want to walk with AD well and with confidence and strengthening RUE.  OBJECTIVE:  Note: Objective measures were completed at Evaluation unless otherwise noted.  DIAGNOSTIC FINDINGS:   COGNITION: Overall cognitive status: Within functional limits for tasks assessed   SENSATION: Light touch: decreased light touch from lateral malleolus distally.   LOWER EXTREMITY ROM:     Active  Right Eval Left Eval  Hip flexion    Hip extension    Hip abduction    Hip adduction    Hip internal rotation    Hip external rotation    Knee flexion    Knee extension    Ankle dorsiflexion    Ankle plantarflexion    Ankle inversion    Ankle eversion     (Blank rows = not tested)  LOWER EXTREMITY MMT:    MMT Right Eval Left Eval Right 06/25/24 Left 06/25/24  Hip flexion      Hip extension      Hip abduction      Hip adduction      Hip internal rotation      Hip external rotation      Knee flexion      Knee extension 4- 4+ 4+ 5  Ankle dorsiflexion      Ankle plantarflexion      Ankle inversion      Ankle eversion      (Blank rows = not tested)   TRANSFERS: Sit to stand: Modified independence  Assistive device utilized: Environmental Consultant - 2 wheeled     Stand to sit: Modified independence  Assistive device utilized: Environmental Consultant - 2 wheeled     Chair to chair: SBA  Assistive device utilized: Environmental Consultant - 2 wheeled       GAIT: Findings: Gait Characteristics: step through pattern, decreased arm swing- Right, decreased arm swing- Left, decreased step length- Right, decreased step length- Left, decreased hip/knee flexion- Right, decreased hip/knee flexion- Left, Right foot flat, Left foot flat, and narrow BOS, Distance walked: 82ft, Assistive device utilized:Walker - 2 wheeled, Level of assistance: Modified independence, and Comments:    FUNCTIONAL TESTS:  TUG: 25.71 seconds  DGI: 05/13/24: 8 / 24  L SLS: <0.1 seconds  R  SLS: <0.1 seconds  07/28/24: SLS L: 2.06 seconds R:  2.23   Norms: 18-39  F: 43.5 seconds  M: 43.2 seconds 40-49  F: 40.4 seconds  M: 40.1 seconds 50-59  F: 36 seconds  M: 38.1 seconds 60-69  F: 25.1 seconds  M: 28.7 seconds 70-79  F: 11.3 seconds  M: 18.3 seconds  PATIENT SURVEYS:  ABC Scale:950 / 1600 = 59.4 % 07/28/24: 1070 / 1600 = 66.9 %                                                                                                                             TREATMENT DATE:  08/20/24:  Nustep seat 10 L5 LE only due to UE pain today. Discussed schedule 6in lunges with 1 UE support 15x each Discussed stairs with use of  RW, step to pattern, reciprocal, lateral step ups (pt most comfortable with lateral step ups BUE support  3RT 7in step height Toe tapping 6in step height Hurdles 6 and 12in with 1 UE support 2RT inside // bars  08/10/24 Nustep seat 10 x 5' level 47, pt avgs 85 SPM; [place] UE/LE Standing: 6 lateral step up and over 1 UE support required 10x  6 forward step over 1 UE support 10X 6 lunge with 2 UE support 2X10 each Gait training with SBQC inside // bars cueing for sequence 3RT with CGA intially then SBA   08/06/24 PWR step using parallel bars x 10 each PWR twist using scarves x 10 each Squat with kettle bell 5# 2 x 10 PWR rock with cones balancing in hands 2 x 10 each Seated PWR up with PVC poles x 8 Walking around mat table using PVC poles in Ue's Nustep to end treatment level 5 x 5' UE and LE    07/28/2024  Progress notes: goals tracked, SLS assessed, DGI performed, pt educated on the updated goals, role of PT, prognosis, and importance of updated HEP compliance.  DGI FORM: Input Gait level surface - walking at normal speed 2 Change in gait speed 2 Gait with horizontal head turns 2 Gait with vertical head turns 2 Gait and pivot turn 1 Step over obstacle 2 Step around obstacles 2 Steps 2  Result Dynamic Gait Index 15 Interpretation: This result is consistent with an increased  fall risk. Neuromuscular Re-education: -SLS trials, 3 reps bilaterally, pt cued for sequencing to get in positioning Therapeutic Activity:  -Walking bout with quad cane and min assist, 20 feet, pt cued for UE placement and sequencing   07/23/24: - STS holding spiral tidal tank 8 reps then report of Lt shoulder pain, given blue ball without weight for 12 more reps - Dead lift incorporating low reaching activities 2x 10 - Cone rotation standing NBOS on foam - 6in lunge with 1 UE support 15x - Squat front of chair 20x - Nustep [place] UE/LE seat 8 L7 x 5'  07/21/24: - Nustep seat 8 x 5' level 7, pt  avgs 70 SPM; [place] UE/LE - STS 15x no UE A - Reports slight light headedness, in a fog - Vitals checked Lt arm in seated position:  134/72 mmHg HR 76 - 6in step up and over 1 UE support required 10x  - Squat no HHA front of chair 10x - 6in lunge with 2 UE support 15x - Gait training with SBQC inside // bars cueing for sequence 3RT  PATIENT EDUCATION: Education details: PT Evaluation, findings, prognosis, frequency, attendance policy. Person educated: Patient Education method: Explanation and Demonstration Education comprehension: verbalized understanding  HOME EXERCISE PROGRAM: Access Code: [email or web address] URL: [email or web address] Date: 05/14/2024 Prepared by: Augustin Mclean  Exercises - Sit to Stand  - 1 x daily - 7 x weekly - 3 sets - 10 reps - Standing March with Counter Support  - 1 x daily - 7 x weekly - 3 sets - 10 reps - Standing Hip Abduction with Counter Support  - 1 x daily - 7 x weekly - 3 sets - 10 reps - Side Stepping with Resistance at Thighs and Counter Support  - 1 x daily - 7 x weekly - 3 sets - 10 reps  06/30/24: - Squat with Chair and Counter Support  - 2 x daily - 7 x weekly - 1 sets - 10 reps - Standing Tandem Balance with Counter Support  - 1 x daily - 7 x weekly - 1 sets - 3 reps - 30 hold  GOALS: Goals reviewed with patient? No  SHORT TERM  GOALS: Target date: 05/25/2024  Pt will be independent with HEP in order to demonstrate participation in Physical Therapy POC.  Baseline: Goal status: MET  2.  Pt will improve ABC score by 15% in order to demonstrate improved pain with functional goals and outcomes. Baseline: 07/28/24: 1070 / 1600 = 66.9 % Goal status: IN PROGRESS  LONG TERM GOALS: Target date: 06/22/2024  Pt will increase DGI score by > MCID in order to demonstrate improved functional safety and balance skills in ADL/mobility.   Baseline: see objective. 06/25/24  14/24 07/28/24: 15/24, (MCID of 6 points) Goal status: MET  2.  Pt will improve TUG by least 3 seconds in order to demonstrate improved functional mobility capacity in community setting.  Baseline: see objective. 06/25/24 21.38 sec with RW Goal status: MET  3.  Pt will improve ABC score by 30% in order to demonstrate improved pain with functional goals and outcomes. Baseline: see objective.  06/25/24 58.8%;  07/28/24: 1070 / 1600 = 66.9 % Goal status: IN PROGRESS  4.  Pt will improve single leg balance by at least 3 seconds bilaterally in order to demonstrate improved capacity, safety, and overall quality of movement to optimize function.  Baseline: see objective. 06/25/24 2 on left and 1 on right  07/28/24: L: 2.06 seconds R: 2.23 Goal status: IN PROGRESS  5. Pt will demonstrate ability to ambulate 50 feet with quad cane and SBA in less than 1 minute for increased independence with gait training and improved community ambulation.  Baseline: 07/28/24:  pt able to walk 20 feet with min assist and quad cane Goal status: IN PROGRESS  ASSESSMENT:  CLINICAL IMPRESSION: Pt reports plans to move upstairs since her kids will be in town this weekend.  Therapist demonstrated different techniques for safe stair training included step to pattern with RW, reciprocal pattern with 2 HR and lateral step ups as pt reports she can only reach 1 handrail at a time.  Pt husband will  be home to assist with walker going up and down the stairs.  Continued session focus with LE strengtheing, balance and coordination.  Presents with imporved activity tolerance with minimal rest breaks required through session.  No reports of pain through session.  Pt due for reasssessment next week.  Eval:  Patient is a 68y.o. female who was seen today for physical therapy evaluation and treatment for 'compression of spinal cord.'  Patient with history of myelopathy C5 spinal fracture, with A C4 through C7 cervical fusion.  Patient presents with significant functional mobility deficits, safety concerns, increased falls risk, reduced ambulation capacity at due to muscle weakness, decreased activity tolerance, balance deficits in setting of previous cervical myelopathy pt will benefit from skilled Physical Therapy services to address deficits/limitations in order to improve functional and QOL.    OBJECTIVE IMPAIRMENTS: Abnormal gait, decreased activity tolerance, decreased balance, decreased mobility, difficulty walking, decreased strength, and postural dysfunction.   ACTIVITY LIMITATIONS: carrying, lifting, bending, standing, squatting, stairs, transfers, bed mobility, and locomotion level  PARTICIPATION LIMITATIONS: meal prep, cleaning, laundry, community activity, occupation, and yard work  PERSONAL FACTORS: Age are also affecting patient's functional outcome.   REHAB POTENTIAL: Excellent  CLINICAL DECISION MAKING: Stable/uncomplicated  EVALUATION COMPLEXITY: Low  PLAN:  PT FREQUENCY: 2x/week  PT DURATION: 4 weeks  PLANNED INTERVENTIONS: 97164- PT Re-evaluation, 97750- Physical Performance Testing, 97110-Therapeutic exercises, 97530- Therapeutic activity, 97112- Neuromuscular re-education, 97535- Self Care, 02859- Manual therapy, [phone]- Gait training, [phone]- Orthotic Initial, Patient/Family education, Balance training, Stair training, Taping, Joint mobilization, Joint manipulation, Spinal  manipulation, Spinal mobilization, Cryotherapy, and Moist heat  PLAN FOR NEXT SESSION: Functional mobility, strengthening, ambulation, etc.   Augustin Mclean, LPTA/CLT; WILLAIM [phone]  6:13 PM, 08/20/24

## 2024-08-25 ENCOUNTER — Ambulatory Visit (HOSPITAL_COMMUNITY): Attending: Surgery | Admitting: Occupational Therapy

## 2024-08-25 ENCOUNTER — Encounter (HOSPITAL_COMMUNITY): Payer: Self-pay | Admitting: Occupational Therapy

## 2024-08-25 DIAGNOSIS — R278 Other lack of coordination: Secondary | ICD-10-CM | POA: Diagnosis present

## 2024-08-25 DIAGNOSIS — M6281 Muscle weakness (generalized): Secondary | ICD-10-CM | POA: Diagnosis present

## 2024-08-25 DIAGNOSIS — R29818 Other symptoms and signs involving the nervous system: Secondary | ICD-10-CM | POA: Insufficient documentation

## 2024-08-25 DIAGNOSIS — Z7409 Other reduced mobility: Secondary | ICD-10-CM | POA: Diagnosis present

## 2024-08-25 DIAGNOSIS — G9520 Unspecified cord compression: Secondary | ICD-10-CM | POA: Insufficient documentation

## 2024-08-25 NOTE — Therapy (Signed)
 OUTPATIENT OCCUPATIONAL THERAPY NEURO TREATMENT NOTE   Patient Name: Angela Norman MRN: [id number] DOB:04-19-1956, 68 y.o., female Today's Date: 08/25/2024    PCP: Shona Norleen PEDLAR, MD REFERRING PROVIDER: Hillman Amour, MD   END OF SESSION:  OT End of Session - 08/25/24 1446     Visit Number 26    Number of Visits 29    Date for Recertification  09/12/24    Authorization Type Medicare Part A and B    Progress Note Due on Visit 26    OT Start Time 1310    OT Stop Time 1353    OT Time Calculation (min) 43 min    Activity Tolerance Patient tolerated treatment well    Behavior During Therapy WFL for tasks assessed/performed           Past Medical History:  Diagnosis Date   Anxiety    Arthritis    Basal cell carcinoma    Charcot's joint of foot    bilateral   Diabetes mellitus without complication (HCC)    Fibromyalgia    Neuropathy    Past Surgical History:  Procedure Laterality Date   ACHILLES TENDON REPAIR Left    bone spur Left    --left foot   BREAST SURGERY     benign breast bx--left--fatty tissue   CARPAL TUNNEL RELEASE Bilateral    COLONOSCOPY     COLONOSCOPY WITH PROPOFOL  N/A 09/06/2022   Procedure: COLONOSCOPY WITH PROPOFOL ;  Surgeon: Shaaron Lamar HERO, MD;  Location: [facility];  Service: Endoscopy;  Laterality: N/A;  10:30am, asa 2   DILATION AND CURETTAGE OF UTERUS  2011   for cervical polyp   FOOT SURGERY Left 03/2018   reconstructive surgery   POLYPECTOMY  09/06/2022   Procedure: POLYPECTOMY;  Surgeon: Shaaron Lamar HERO, MD;  [facility]: [facility];  Service: Endoscopy;;   Patient Active Problem List   Diagnosis Date Noted   Anxiety 06/28/2022   Arthritis 06/28/2022   Body fluid retention 06/28/2022   Hyperglycemia due to type 2 diabetes mellitus (HCC) 06/28/2022   Mixed hyperlipidemia 06/28/2022   Fibromyalgia 06/28/2022   Cough 10/25/2021   Musculoskeletal pain 05/21/2012   Stiffness of joint, not elsewhere classified, ankle  and foot 08/13/2011   Difficulty walking 08/13/2011   Ankle weakness 08/13/2011    ONSET DATE: 08/2023  REFERRING DIAG:  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 Pt had multiple bouts of acute and inpatient rehab stays due to complications. Significant BP issues throughout this time. Had stay in multiple facilities. Pt reported various changes in functional status, was requiring assist for ADLs and mobility since April 28th. Since then, pt has been home with Home Health services. Pt continues to report numbness in BUE and poor grip strength. Pt reports neuropathy in BLE. Prior to onset of November '24 was walking daily for 45-60 minutes.   PRECAUTIONS: Fall  WEIGHT BEARING RESTRICTIONS: No  PAIN:  Are you having pain? Yes: NPRS scale: 4/10 Pain location: my body Pain description: sore to the touch Aggravating factors: overuse, poor sleep Relieving factors: pain cream  FALLS: Has patient fallen in  last 6 months? No  LIVING ENVIRONMENT: Lives with: lives with their spouse Lives in: House/apartment Stairs: Pt lives in the basement with no stairs Has following equipment at home: Vannie - 2 wheeled and shower chair  PLOF: Independent  PATIENT GOALS: To improve mobility and strengthening  OBJECTIVE:  Note: Objective measures were completed at Evaluation unless otherwise noted.  HAND DOMINANCE: Right  ADLs: Overall ADLs: Pt has difficulty with all small object manipulation, including but not limited to buttons, shoe laces, and zippers. Additionally she has max difficulty with cooking and  cleaning due to weakness in both hands and wrists.   MOBILITY STATUS: Hx of falls, difficulty with turns, and difficulty carrying objections with ambulation  POSTURE COMMENTS:  rounded shoulders Sitting balance: Moves/returns truncal midpoint 1-2 inches in multiple planes  ACTIVITY TOLERANCE: Activity tolerance: Fair tolerance - fatigues quicker than normal  FUNCTIONAL OUTCOME MEASURES: Quick Dash: 47.73 07/16/24: 20.45 08/13/24: 34.09  UPPER EXTREMITY ROM:    All ROM is [facility]  UPPER EXTREMITY MMT:     MMT Right eval Left eval Right 07/16/24 Left 07/16/24 Right 08/13/24 Left 08/13/24  Shoulder flexion 5/5 5/5 5/5 5/5 5/5 5/5  Shoulder abduction 5/5 5/5 5/5 4/5 4+/5 4+/5  Shoulder internal rotation 5/5 5/5 5/5 5/5 5/5 5/5  Shoulder external rotation 5/5 5/5 4+/5 4+/5 5/5 4+/5  Elbow flexion 5/5 5/5 5/5 5/5 5/5 5/5  Elbow extension 5/5 5/5 4+/5 4+/5 4+/5 4+/5  Wrist flexion 4/5 4+/5 4+/5 5/5 4+/5 5/5  Wrist extension 4-/5 4/5 5/5 5/5 5/5 5/5  Wrist ulnar deviation 4-/5 4/5 4/5 4+/5 4+/5 5/5  Wrist radial deviation 4-/5 4-/5 5/5 5/5 5/5 5/5  Wrist pronation 4/5 4+/5 5/5 5/5 4/5 5/5  Wrist supination 4/5 4/5 4+/5 4+/5 4+/5 5/5  (Blank rows = not tested)  HAND FUNCTION: Grip strength: Right: 20 lbs; Left: 27 lbs, Lateral pinch: Right: 5 lbs, Left: 9 lbs, and 3 point pinch: Right: 5 lbs, Left: 4 lbs 07/16/24: Grip strength: Right: 31 lbs; Left: 35 lbs, Lateral pinch: Right: 7 lbs, Left: 8 lbs, and 3 point pinch: Right: 7 lbs, Left: 9 lbs 08/13/24: Grip strength: Right: 30 lbs; Left: 44 lbs, Lateral pinch: Right: 6 lbs, Left: 8 lbs, and 3 point pinch: Right: 8 lbs, Left: 8 lbs  COORDINATION: 06/30/24-9 Hole Peg test: Right: 33.28 sec; Left: 38.26 sec 07/16/24- 9 Hole Peg test: Right: 52.15 sec; Left: 49.88 sec 08/13/24: 9 Hole Peg test: Right: 51.00 sec; Left: 38.09 sec  SENSATION: Pt has full numbness along median nerve of R hand as well as dulled sensation and tingling in BUE  up to elbows.   EDEMA: No swelling noted  OBSERVATIONS: adducted thumbs and 80% grip                                                                                                                             TREATMENT DATE:  08/25/24 -Mariette: teal, pronation and supination bends, ulnar  and radial bends, flexion and extension twists, x12 -Tendon Glides: BUE, 10 reps -Gripper: 35# horizontal RUE and LUE medium beads, 37# squeezes RUE and LUE vertical -Pinch Strengthening: green clip, 2 stacks of 5 cubes, blue clips, 2 stacks of 5 cubes, BUE  08/20/24 -Strengthening: 3#, protraction, flexion, abduction, er/ir, horizontal abduction, bicep curls, x15 -Theraputty: red, roll into ball, flatten into pancake, PVC pipe pull, roll into log, tripod pinch, lateral pinch, roll into ball, squeeze  -Pinch tree: red, green, blue resistance clips -Tiny peg board with pattern, using tweezers  08/18/24 -Strengthening: 3#, protraction, flexion, abduction, er/ir, horizontal abduction, bicep curls, x15 -Wrist Strengthening: 3#, flexion, extension, ulnar/radial deviation, supination/pronation, x15 -Tendon Glides: BUE, 10 reps -Pinch Strengthening: red clip, 2 stacks of 5 cubes, green clips, 2 stacks of 5 cubes, BUE  08/13/24 -Tendon Glides: right hand, 10 reps -Towel crumple: 10 reps with right hand -Finger taps: 10 reps each with right hand -Chip manipulation: pt holding 6 sequence chips in right hand, working on palm to fingertip translation to move to fingertips and place on towel. Pt then picking up one at a time and moving to palm. Completed 2 rounds. -Extensive discussion on pt home set up and plan to move upstairs this week. Discussed bathroom set up, recommendations of grab bars and positioning for success with various tasks. Also discussed navigating the entrance of the home.    08/11/24 -Mariette: teal, pronation and supination bends, ulnar and radial bends, flexion and extension twists,  x12 -Hand gripper: RUE 35# medium beads, LUE 25# medium beads -Coins: flipping 10 coins and holding all of them and placing in the piggy bank   PATIENT EDUCATION: Education details: Continue HEP Person educated: Patient Education method: Programmer, Multimedia, Demonstration, and Handouts Education comprehension: verbalized understanding and returned demonstration  HOME EXERCISE PROGRAM: 8/1: Wrist ROM and Digit ROM 8/8: Shoulder A/ROM 8/20: Scapular Strengthening 9/4: Coin work 9/18: theraband wrist strengthening-red 10/14: PNF Strengthening    GOALS: Goals reviewed with patient? Yes  SHORT TERM GOALS: Target date: 06/19/24  Pt will be provided and educated on a comprehensive HEP for BUE mobility in order to complete ADL's and IADL's independently.   Goal status: IN PROGRESS  2.  Pt will improve BUE strength to 5/5 in order to lift and carry items during cooking and cleaning tasks.   Goal status: Partially Met  3.  Pt will improve RUE grip by 15# and pinch by 3#; and LUE grip by 20# and pinch by 5# in order to grasp and hold pots and pans.  Goal status: IN PROGRESS  4.  Pt will improve RUE coordination by completing 9 hole peg test in 45 or less in order to manipulate and complete fine motor tasks.   Goal status: Revised  5. Pt will be educated on AE available to use during ADLs and compensatory strategies to improve safety, independence, and success with functional tasks.     Goal status: New    6. Pt will decrease pain in BUE to improve ability to sleep for 2+ consecutive hours without waking due to pain.       Goal status: New   ASSESSMENT:  CLINICAL IMPRESSION: This session pt demonstrating continued strengthening and improving stamina. She was able to improve to 37# on BUE for grip strength, as well as maintaining good form and tolerance to therabar work. OT providing verbal and visual cuing for positioning and technique.   PERFORMANCE DEFICITS: in functional skills  including ADLs, IADLs, coordination, dexterity, sensation,  ROM, strength, pain, fascial restrictions, Fine motor control, Gross motor control, body mechanics, and UE functional use.    PLAN:  OT FREQUENCY: 2x/week  OT DURATION: 4 weeks  PLANNED INTERVENTIONS: 97168 OT Re-evaluation, 97535 self care/ADL training, 02889 therapeutic exercise, 97530 therapeutic activity, 97112 neuromuscular re-education, 97140 manual therapy, 97035 ultrasound, 97018 paraffin, 02989 moist heat, 97032 electrical stimulation (manual), passive range of motion, functional mobility training, energy conservation, coping strategies training, patient/family education, and DME and/or AE instructions  CONSULTED AND AGREED WITH PLAN OF CARE: Patient  PLAN FOR NEXT SESSION: Wrist and Digit ROM, strengthening, coordination tasks   Valentin Nightingale, OTR/L [phone] 08/25/2024, 2:54 PM

## 2024-08-26 ENCOUNTER — Ambulatory Visit (HOSPITAL_COMMUNITY): Admitting: Psychiatry

## 2024-08-26 ENCOUNTER — Ambulatory Visit (INDEPENDENT_AMBULATORY_CARE_PROVIDER_SITE_OTHER): Admitting: Psychiatry

## 2024-08-26 DIAGNOSIS — F411 Generalized anxiety disorder: Secondary | ICD-10-CM

## 2024-08-26 NOTE — Progress Notes (Signed)
 IN- PERSON  THERAPIST PROGRESS NOTE  Session Time:  Wednesday 08/26/2024 3:04 PM - 3:51 PM   Participation Level: Active  Behavioral Response: CasualAlertAnxious  Type of Therapy: Individual Therapy  Treatment Goals addressed:  LTG:Pt will reduce anxiety AEB decreased episodes of worry from daily to 2 x per week for 30 days per pt's report.   Pt will identify/challenge/replace anxiety provoking thoughts, learn strategies to limit worry time   ProgressTowards Goals: Progressing   Interventions: CBT and Supportive  Summary: Angela Norman is a 68 y.o. female who is a returning pt to this clinician and last was seen about 9 months ago.  Pt reports no psychiatric hospitalizations. She reports no previous involvement in therapy.  Patient reports needing to talk to someone as she has experienced several transitions in the past several months.  She incurred a spinal cord injury on September 05, 2023  As a result, she had surgery and participated in rehab for several months.  She reports becoming more anxious since her return home in April 2025.  She is experiencing loneliness.  She also expresses worry about her husband as he went into a deep depression when she was injured.  He just completed TMS and is slightly better.  Patient reports irritability, ruminating about the past, restlessness, worry about her family and the future.  Patient last was seen 2 weeks ago. She reports decreased stress and worry regarding her husband.  Per patient's report, husband seems to be feeling better and has been much more engaged in activities.  He also has been more talkative.  Patient reports recently enjoying going out to dinner with husband.  Patient recently moved upstairs and reports decreased stress now that the move has been completed.  She reports she and her husband are adjusting well to the move.  She continues to have appropriate concern about her daughter who often expresses her concerns and worries to  patient.  However, patient reports not being overwhelmed by this.  She is looking forward to attending a retirees luncheon in the next couple of weeks but is expressing anxiety provoking thoughts mainly what if thoughts.    Suicidal/Homicidal: Nowithout intent/plan  Therapist Response: Reviewed symptoms, praised and reinforced patient's efforts to keep anxiety log, assisted patient examine connection between thoughts/mood/behavior, assisted patient examine her worry thoughts about attending retirees luncheon, assisted patient assess her level of anxiety about attending luncheon after examining her worry thoughts, developed plan with patient to continue keeping anxiety log and use worry exploration handout to examine what if thoughts     diagnosis: Generalized anxiety disorder  Collaboration of Care: Other none needed at this session  Patient/Guardian was advised Release of Information must be obtained prior to any record release in order to collaborate their care with an outside provider. Patient/Guardian was advised if they have not already done so to contact the registration department to sign all necessary forms in order for us  to release information regarding their care.   Consent: Patient/Guardian gives verbal consent for treatment and assignment of benefits for services provided during this visit. Patient/Guardian expressed understanding and agreed to proceed.   Winton FORBES Rubinstein, LCSW 08/26/2024

## 2024-08-27 ENCOUNTER — Ambulatory Visit (HOSPITAL_COMMUNITY)

## 2024-08-27 ENCOUNTER — Encounter (HOSPITAL_COMMUNITY): Admitting: Occupational Therapy

## 2024-08-27 ENCOUNTER — Encounter (HOSPITAL_COMMUNITY): Payer: Self-pay

## 2024-08-27 DIAGNOSIS — R278 Other lack of coordination: Secondary | ICD-10-CM

## 2024-08-27 DIAGNOSIS — Z7409 Other reduced mobility: Secondary | ICD-10-CM

## 2024-08-27 DIAGNOSIS — G9520 Unspecified cord compression: Secondary | ICD-10-CM

## 2024-08-27 DIAGNOSIS — M6281 Muscle weakness (generalized): Secondary | ICD-10-CM

## 2024-08-27 DIAGNOSIS — R29818 Other symptoms and signs involving the nervous system: Secondary | ICD-10-CM

## 2024-08-27 NOTE — Therapy (Signed)
 OUTPATIENT PHYSICAL THERAPY NEURO TREATMENT PHYSICAL THERAPY DISCHARGE SUMMARY  Visits from Start of Care: 22  Current functional level related to goals / functional outcomes: See below   Remaining deficits: balance   Education / Equipment: Referral process   Patient agrees to discharge. Patient goals were partially met. Patient is being discharged due to lack of progress.   Patient Name: Angela Norman MRN: [id number] DOB:10/22/1956, 68 y.o., female Today's Date: 08/27/2024     END OF SESSION:  PT End of Session - 08/27/24 1245     Visit Number 24    Number of Visits 28    Date for Recertification  08/28/24    Authorization Type Medicare A & B    Progress Note Due on Visit 28    PT Start Time 1245    PT Stop Time 1323    PT Time Calculation (min) 38 min    Equipment Utilized During Treatment Gait belt    Activity Tolerance Patient tolerated treatment well    Behavior During Therapy WFL for tasks assessed/performed                  Past Medical History:  Diagnosis Date   Anxiety    Arthritis    Basal cell carcinoma    Charcot's joint of foot    bilateral   Diabetes mellitus without complication (HCC)    Fibromyalgia    Neuropathy    Past Surgical History:  Procedure Laterality Date   ACHILLES TENDON REPAIR Left    bone spur Left    --left foot   BREAST SURGERY     benign breast bx--left--fatty tissue   CARPAL TUNNEL RELEASE Bilateral    COLONOSCOPY     COLONOSCOPY WITH PROPOFOL  N/A 09/06/2022   Procedure: COLONOSCOPY WITH PROPOFOL ;  Surgeon: Shaaron Lamar HERO, MD;  Location: [facility];  Service: Endoscopy;  Laterality: N/A;  10:30am, asa 2   DILATION AND CURETTAGE OF UTERUS  2011   for cervical polyp   FOOT SURGERY Left 03/2018   reconstructive surgery   POLYPECTOMY  09/06/2022   Procedure: POLYPECTOMY;  Surgeon: Shaaron Lamar HERO, MD;  Location: [facility];  Service: Endoscopy;;   Patient Active Problem List   Diagnosis Date  Noted   Anxiety 06/28/2022   Arthritis 06/28/2022   Body fluid retention 06/28/2022   Hyperglycemia due to type 2 diabetes mellitus (HCC) 06/28/2022   Mixed hyperlipidemia 06/28/2022   Fibromyalgia 06/28/2022   Cough 10/25/2021   Musculoskeletal pain 05/21/2012   Stiffness of joint, not elsewhere classified, ankle and foot 08/13/2011   Difficulty walking 08/13/2011   Ankle weakness 08/13/2011   PCP: Sebastian Othel GAILS, FNP REFERRING PROVIDER: Edna Norris, MD ONSET DATE: November 2024  REFERRING DIAG: compression of spinal cord   THERAPY DIAG:  Other lack of coordination  Other symptoms and signs involving the nervous system  Compression of spinal cord (HCC)  Impaired functional mobility, balance, gait, and endurance  Muscle weakness (generalized)  Rationale for Evaluation and Treatment: Rehabilitation  SUBJECTIVE:  SUBJECTIVE STATEMENT: Pt states the weather is effecting her arm pain but none today. Pt states she has been using the walker more instead of the wheel chair downstairs. Pt states she has been able to take showers on her own now, the aide is still coming but she is pretty much independent.   Eval:  Pt reported all this started with UTI that caused fracture of neck and indicated as C4-C7 fusion. Pt had multiple bouts of acute and inpatient rehab stays due to complications. Significant BP issues throughout this time. Had stay in multiple facilities. Pt reported various changes in functional status, was requiring assist for ADLs and mobility since April 28th. Since then, pt has been home with Home Health services. Pt continues to report numbness in BUE and poor grip strength. Pt reports neuropathy in BLE. Prior to onset of November '24 was walking daily for 45-60 minutes.   Pt  has CT scan for nodule on Lung on 04/28/24.  Pt accompanied by: self  PERTINENT HISTORY:  Cervical Myelopathy with ACDF 4-7 in November 2024\ Left ankle surgery x 3  PAIN:  Are you having pain? No and chronic pain in left shoulder  PRECAUTIONS: None  RED FLAGS: Bowel or bladder incontinence: Yes: SCI   WEIGHT BEARING RESTRICTIONS: No  FALLS: Has patient fallen in last 6 months? Yes. Number of falls 1-2   PATIENT GOALS:  want to walk with AD well and with confidence and strengthening RUE.  OBJECTIVE:  Note: Objective measures were completed at Evaluation unless otherwise noted.  DIAGNOSTIC FINDINGS:   COGNITION: Overall cognitive status: Within functional limits for tasks assessed   SENSATION: Light touch: decreased light touch from lateral malleolus distally.   LOWER EXTREMITY ROM:     Active  Right Eval Left Eval  Hip flexion    Hip extension    Hip abduction    Hip adduction    Hip internal rotation    Hip external rotation    Knee flexion    Knee extension    Ankle dorsiflexion    Ankle plantarflexion    Ankle inversion    Ankle eversion     (Blank rows = not tested)  LOWER EXTREMITY MMT:    MMT Right Eval Left Eval Right 06/25/24 Left 06/25/24  Hip flexion      Hip extension      Hip abduction      Hip adduction      Hip internal rotation      Hip external rotation      Knee flexion      Knee extension 4- 4+ 4+ 5  Ankle dorsiflexion      Ankle plantarflexion      Ankle inversion      Ankle eversion      (Blank rows = not tested)   TRANSFERS: Sit to stand: Modified independence  Assistive device utilized: Environmental Consultant - 2 wheeled     Stand to sit: Modified independence  Assistive device utilized: Environmental Consultant - 2 wheeled     Chair to chair: SBA  Assistive device utilized: Environmental Consultant - 2 wheeled       GAIT: Findings: Gait Characteristics: step through pattern, decreased arm swing- Right, decreased arm swing- Left, decreased step length- Right, decreased  step length- Left, decreased hip/knee flexion- Right, decreased hip/knee flexion- Left, Right foot flat, Left foot flat, and narrow BOS, Distance walked: 56ft, Assistive device utilized:Walker - 2 wheeled, Level of assistance: Modified independence, and Comments:    FUNCTIONAL TESTS:  TUG: 25.71 seconds  DGI: 05/13/24: 8 / 24  L SLS: <0.1 seconds  R SLS: <0.1 seconds  07/28/24: SLS L: 2.06 seconds R: 2.23   Norms: 18-39  F: 43.5 seconds  M: 43.2 seconds 40-49  F: 40.4 seconds  M: 40.1 seconds 50-59  F: 36 seconds  M: 38.1 seconds 60-69  F: 25.1 seconds  M: 28.7 seconds 70-79  F: 11.3 seconds  M: 18.3 seconds  PATIENT SURVEYS:  ABC Scale:950 / 1600 = 59.4 % 07/28/24: 1070 / 1600 = 66.9 %                                                                                                                             TREATMENT DATE:  08/27/2024  Progress note: Goals reviewed, education given, HEP reviewed. Gait training:  2 bouts of 1 minute with Quad cane, 36 feet and 49 feet Neuromuscular Re-education: Standing balance, tandem stance 30 second holds Single leg stance, 3 trials each side, R: 2.14 seconds, L: 2.96 seconds Therapeutic Activity: -Sit to stands, 1 sets of 5 reps, pt cued for core activation and keeping weight close to chest    08/20/24:  Nustep seat 10 L5 LE only due to UE pain today. Discussed schedule 6in lunges with 1 UE support 15x each Discussed stairs with use of  RW, step to pattern, reciprocal, lateral step ups (pt most comfortable with lateral step ups BUE support  3RT 7in step height Toe tapping 6in step height Hurdles 6 and 12in with 1 UE support 2RT inside // bars  08/10/24 Nustep seat 10 x 5' level 47, pt avgs 85 SPM; [place] UE/LE Standing: 6 lateral step up and over 1 UE support required 10x  6 forward step over 1 UE support 10X 6 lunge with 2 UE support 2X10 each Gait training with SBQC inside // bars cueing for sequence 3RT with  CGA intially then SBA    PATIENT EDUCATION: Education details: PT Evaluation, findings, prognosis, frequency, attendance policy. Person educated: Patient Education method: Explanation and Demonstration Education comprehension: verbalized understanding  HOME EXERCISE PROGRAM: Access Code: [email or web address] URL: [email or web address] Date: 08/27/2024 Prepared by: Lang Ada  Exercises - Sit to Stand  - 1 x daily - 7 x weekly - 3 sets - 10 reps - Standing March with Counter Support  - 1 x daily - 7 x weekly - 3 sets - 10 reps - Standing Hip Abduction with Counter Support  - 1 x daily - 7 x weekly - 3 sets - 10 reps - Side Stepping with Resistance at Thighs and Counter Support  - 1 x daily - 7 x weekly - 3 sets - 10 reps - Squat with Chair and Counter Support  - 2 x daily - 7 x weekly - 1 sets - 10 reps - Standing Tandem Balance with Counter Support  - 1 x daily - 7 x weekly - 1 sets - 3 reps - 30 hold  GOALS:  Goals reviewed with patient? Yes  SHORT TERM GOALS: Target date: 05/25/2024  Pt will be independent with HEP in order to demonstrate participation in Physical Therapy POC.  Baseline: Goal status: MET  2.  Pt will improve ABC score by 15% in order to demonstrate improved pain with functional goals and outcomes. Baseline: 07/28/24: 1070 / 1600 = 66.9 %, 08/27/24: 1120 / 1600 = 70.0 % Goal status: NOT MET  LONG TERM GOALS: Target date: 06/22/2024  Pt will increase DGI score by > MCID in order to demonstrate improved functional safety and balance skills in ADL/mobility.   Baseline: see objective. 06/25/24  14/24 07/28/24: 15/24, (MCID of 6 points) Goal status: MET  2.  Pt will improve TUG by least 3 seconds in order to demonstrate improved functional mobility capacity in community setting.  Baseline: see objective. 06/25/24 21.38 sec with RW Goal status: MET  3.  Pt will improve ABC score by 30% in order to demonstrate improved pain with functional goals and  outcomes. Baseline: see objective.  06/25/24 58.8%;  07/28/24: 1070 / 1600 = 66.9 %; 08/27/24: 1120 / 1600 = 70.0 % Goal status: NOT MET  4.  Pt will improve single leg balance by at least 3 seconds bilaterally in order to demonstrate improved capacity, safety, and overall quality of movement to optimize function.  Baseline: see objective. 06/25/24 2 on left and 1 on right  07/28/24: L: 2.06 seconds R: 2.23 Goal status: NOT MET  5. Pt will demonstrate ability to ambulate 50 feet with quad cane and SBA in less than 1 minute for increased independence with gait training and improved community ambulation.  Baseline: 07/28/24:  pt able to walk 20 feet with min assist and quad cane; 08/27/24: 49 feet Goal status: NOT MET  ASSESSMENT:  CLINICAL IMPRESSION: Patient continues to demonstrate decreased LE strength, decreased gait quality and balance. Patient also continues difficulty meeting SLS and ABC score goals. Pt was able to demonstrate improved ability to ambulate with quad cane only missing the 1 minute goal by one foot. Patient educated on referral process, role of PT and goal of being independent with progression of functional mobility in the home. Pt has been seen for physical therapy services since July. Patient to be discharged this date to independent HEP as PT has shown plateau in progress towards meeting balance and ABC goals and is safe with household ambulation.   Eval:  Patient is a 68y.o. female who was seen today for physical therapy evaluation and treatment for 'compression of spinal cord.'  Patient with history of myelopathy C5 spinal fracture, with A C4 through C7 cervical fusion.  Patient presents with significant functional mobility deficits, safety concerns, increased falls risk, reduced ambulation capacity at due to muscle weakness, decreased activity tolerance, balance deficits in setting of previous cervical myelopathy pt will benefit from skilled Physical Therapy services to  address deficits/limitations in order to improve functional and QOL.    OBJECTIVE IMPAIRMENTS: Abnormal gait, decreased activity tolerance, decreased balance, decreased mobility, difficulty walking, decreased strength, and postural dysfunction.   ACTIVITY LIMITATIONS: carrying, lifting, bending, standing, squatting, stairs, transfers, bed mobility, and locomotion level  PARTICIPATION LIMITATIONS: meal prep, cleaning, laundry, community activity, occupation, and yard work  PERSONAL FACTORS: Age are also affecting patient's functional outcome.   REHAB POTENTIAL: Excellent  CLINICAL DECISION MAKING: Stable/uncomplicated  EVALUATION COMPLEXITY: Low  PLAN:  PT FREQUENCY: 2x/week  PT DURATION: 4 weeks  PLANNED INTERVENTIONS: 97164- PT Re-evaluation, 97750- Physical Performance Testing, 97110-Therapeutic exercises,  02469- Therapeutic activity, [id number]- Neuromuscular re-education, [phone]- Self Care, 02859- Manual therapy, [phone]- Gait training, [phone]- Orthotic Initial, Patient/Family education, Balance training, Stair training, Taping, Joint mobilization, Joint manipulation, Spinal manipulation, Spinal mobilization, Cryotherapy, and Moist heat  PLAN FOR NEXT SESSION: Discharged  Lang Ada, PT, DPT [facility] Office: [phone] 2:48 PM, 08/27/24

## 2024-08-31 ENCOUNTER — Encounter (HOSPITAL_COMMUNITY)

## 2024-09-01 ENCOUNTER — Encounter (HOSPITAL_COMMUNITY)

## 2024-09-03 ENCOUNTER — Ambulatory Visit (HOSPITAL_COMMUNITY): Admitting: Occupational Therapy

## 2024-09-03 ENCOUNTER — Encounter (HOSPITAL_COMMUNITY): Payer: Self-pay | Admitting: Occupational Therapy

## 2024-09-03 ENCOUNTER — Encounter (HOSPITAL_COMMUNITY)

## 2024-09-03 DIAGNOSIS — R29818 Other symptoms and signs involving the nervous system: Secondary | ICD-10-CM

## 2024-09-03 DIAGNOSIS — R278 Other lack of coordination: Secondary | ICD-10-CM

## 2024-09-03 NOTE — Therapy (Unsigned)
 OUTPATIENT OCCUPATIONAL THERAPY NEURO TREATMENT NOTE   Patient Name: Angela Norman MRN: [id number] DOB:28-Nov-1955, 68 y.o., female Today's Date: 09/04/2024    PCP: Shona Norleen PEDLAR, MD REFERRING PROVIDER: Hillman Amour, MD   END OF SESSION:  OT End of Session - 09/03/24 1533     Visit Number 27    Number of Visits 29    Date for Recertification  09/12/24    Authorization Type Medicare Part A and B    Progress Note Due on Visit 26    OT Start Time 1443    OT Stop Time 1533    OT Time Calculation (min) 50 min    Activity Tolerance Patient tolerated treatment well    Behavior During Therapy WFL for tasks assessed/performed          Past Medical History:  Diagnosis Date   Anxiety    Arthritis    Basal cell carcinoma    Charcot's joint of foot    bilateral   Diabetes mellitus without complication (HCC)    Fibromyalgia    Neuropathy    Past Surgical History:  Procedure Laterality Date   ACHILLES TENDON REPAIR Left    bone spur Left    --left foot   BREAST SURGERY     benign breast bx--left--fatty tissue   CARPAL TUNNEL RELEASE Bilateral    COLONOSCOPY     COLONOSCOPY WITH PROPOFOL  N/A 09/06/2022   Procedure: COLONOSCOPY WITH PROPOFOL ;  Surgeon: Shaaron Lamar HERO, MD;  Location: [facility];  Service: Endoscopy;  Laterality: N/A;  10:30am, asa 2   DILATION AND CURETTAGE OF UTERUS  2011   for cervical polyp   FOOT SURGERY Left 03/2018   reconstructive surgery   POLYPECTOMY  09/06/2022   Procedure: POLYPECTOMY;  Surgeon: Shaaron Lamar HERO, MD;  Location: [facility];  Service: Endoscopy;;   Patient Active Problem List   Diagnosis Date Noted   Anxiety 06/28/2022   Arthritis 06/28/2022   Body fluid retention 06/28/2022   Hyperglycemia due to type 2 diabetes mellitus (HCC) 06/28/2022   Mixed hyperlipidemia 06/28/2022   Fibromyalgia 06/28/2022   Cough 10/25/2021   Musculoskeletal pain 05/21/2012   Stiffness of joint, not elsewhere classified, ankle and  foot 08/13/2011   Difficulty walking 08/13/2011   Ankle weakness 08/13/2011    ONSET DATE: 08/2023  REFERRING DIAG:  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 Pt had multiple bouts of acute and inpatient rehab stays due to complications. Significant BP issues throughout this time. Had stay in multiple facilities. Pt reported various changes in functional status, was requiring assist for ADLs and mobility since April 28th. Since then, pt has been home with Home Health services. Pt continues to report numbness in BUE and poor grip strength. Pt reports neuropathy in BLE. Prior to onset of November '24 was walking daily for 45-60 minutes.   PRECAUTIONS: Fall  WEIGHT BEARING RESTRICTIONS: No  PAIN:  Are you having pain? Yes: NPRS scale: 4/10 Pain location: my body Pain description: sore to the touch Aggravating factors: overuse, poor sleep Relieving factors: pain cream  FALLS: Has patient fallen in last  6 months? No  LIVING ENVIRONMENT: Lives with: lives with their spouse Lives in: House/apartment Stairs: Pt lives in the basement with no stairs Has following equipment at home: Vannie - 2 wheeled and shower chair  PLOF: Independent  PATIENT GOALS: To improve mobility and strengthening  OBJECTIVE:  Note: Objective measures were completed at Evaluation unless otherwise noted.  HAND DOMINANCE: Right  ADLs: Overall ADLs: Pt has difficulty with all small object manipulation, including but not limited to buttons, shoe laces, and zippers. Additionally she has max difficulty with cooking and  cleaning due to weakness in both hands and wrists.   MOBILITY STATUS: Hx of falls, difficulty with turns, and difficulty carrying objections with ambulation  POSTURE COMMENTS:  rounded shoulders Sitting balance: Moves/returns truncal midpoint 1-2 inches in multiple planes  ACTIVITY TOLERANCE: Activity tolerance: Fair tolerance - fatigues quicker than normal  FUNCTIONAL OUTCOME MEASURES: Quick Dash: 47.73 07/16/24: 20.45 08/13/24: 34.09  UPPER EXTREMITY ROM:    All ROM is [facility]  UPPER EXTREMITY MMT:     MMT Right eval Left eval Right 07/16/24 Left 07/16/24 Right 08/13/24 Left 08/13/24  Shoulder flexion 5/5 5/5 5/5 5/5 5/5 5/5  Shoulder abduction 5/5 5/5 5/5 4/5 4+/5 4+/5  Shoulder internal rotation 5/5 5/5 5/5 5/5 5/5 5/5  Shoulder external rotation 5/5 5/5 4+/5 4+/5 5/5 4+/5  Elbow flexion 5/5 5/5 5/5 5/5 5/5 5/5  Elbow extension 5/5 5/5 4+/5 4+/5 4+/5 4+/5  Wrist flexion 4/5 4+/5 4+/5 5/5 4+/5 5/5  Wrist extension 4-/5 4/5 5/5 5/5 5/5 5/5  Wrist ulnar deviation 4-/5 4/5 4/5 4+/5 4+/5 5/5  Wrist radial deviation 4-/5 4-/5 5/5 5/5 5/5 5/5  Wrist pronation 4/5 4+/5 5/5 5/5 4/5 5/5  Wrist supination 4/5 4/5 4+/5 4+/5 4+/5 5/5  (Blank rows = not tested)  HAND FUNCTION: Grip strength: Right: 20 lbs; Left: 27 lbs, Lateral pinch: Right: 5 lbs, Left: 9 lbs, and 3 point pinch: Right: 5 lbs, Left: 4 lbs 07/16/24: Grip strength: Right: 31 lbs; Left: 35 lbs, Lateral pinch: Right: 7 lbs, Left: 8 lbs, and 3 point pinch: Right: 7 lbs, Left: 9 lbs 08/13/24: Grip strength: Right: 30 lbs; Left: 44 lbs, Lateral pinch: Right: 6 lbs, Left: 8 lbs, and 3 point pinch: Right: 8 lbs, Left: 8 lbs  COORDINATION: 06/30/24-9 Hole Peg test: Right: 33.28 sec; Left: 38.26 sec 07/16/24- 9 Hole Peg test: Right: 52.15 sec; Left: 49.88 sec 08/13/24: 9 Hole Peg test: Right: 51.00 sec; Left: 38.09 sec  SENSATION: Pt has full numbness along median nerve of R hand as well as dulled sensation and tingling in BUE  up to elbows.   EDEMA: No swelling noted  OBSERVATIONS: adducted thumbs and 80% grip                                                                                                                             TREATMENT DATE:  09/03/24 -Shoulder Strengthening: 1#, seated, flexion, abduction, protraction, horizontal  abduction, er/IR, x to v arms, x15 -grooved peg board -Therabar: teal, pronation and supination bends, ulnar and radial bends, flexion and extension twists, x12 -Tendon Glides: BUE, 10 reps  08/25/24 -Mariette: teal, pronation and supination bends, ulnar and radial bends, flexion and extension twists, x12 -Tendon Glides: BUE, 10 reps -Gripper: 35# horizontal RUE and LUE medium beads, 37# squeezes RUE and LUE vertical -Pinch Strengthening: green clip, 2 stacks of 5 cubes, blue clips, 2 stacks of 5 cubes, BUE  08/20/24 -Strengthening: 3#, protraction, flexion, abduction, er/ir, horizontal abduction, bicep curls, x15 -Theraputty: red, roll into ball, flatten into pancake, PVC pipe pull, roll into log, tripod pinch, lateral pinch, roll into ball, squeeze  -Pinch tree: red, green, blue resistance clips -Tiny peg board with pattern, using tweezers   PATIENT EDUCATION: Education details: Continue HEP Person educated: Patient Education method: Programmer, Multimedia, Demonstration, and Handouts Education comprehension: verbalized understanding and returned demonstration  HOME EXERCISE PROGRAM: 8/1: Wrist ROM and Digit ROM 8/8: Shoulder A/ROM 8/20: Scapular Strengthening 9/4: Coin work 9/18: theraband wrist strengthening-red 10/14: PNF Strengthening    GOALS: Goals reviewed with patient? Yes  SHORT TERM GOALS: Target date: 06/19/24  Pt will be provided and educated on a comprehensive HEP for BUE mobility in order to complete ADL's and IADL's independently.   Goal status: IN PROGRESS  2.  Pt will improve BUE strength to 5/5 in order to lift and carry items during cooking and  cleaning tasks.   Goal status: Partially Met  3.  Pt will improve RUE grip by 15# and pinch by 3#; and LUE grip by 20# and pinch by 5# in order to grasp and hold pots and pans.  Goal status: IN PROGRESS  4.  Pt will improve RUE coordination by completing 9 hole peg test in 45 or less in order to manipulate and complete fine motor tasks.   Goal status: Revised  5. Pt will be educated on AE available to use during ADLs and compensatory strategies to improve safety, independence, and success with functional tasks.     Goal status: New    6. Pt will decrease pain in BUE to improve ability to sleep for 2+ consecutive hours without waking due to pain.       Goal status: New   ASSESSMENT:  CLINICAL IMPRESSION: This session pt continued to work on shoulder strength, which she demonstrated full ROM and improved stability. This session however she had mild increased difficulty with grooved peg board, requiring increased time due to coordination. Pt does feel that she is improving however the process is slow. Verbal and tactile cuing provided for positioning and technique throughout session.   PERFORMANCE DEFICITS: in functional skills including ADLs, IADLs, coordination, dexterity, sensation, ROM, strength, pain, fascial restrictions, Fine motor control, Gross motor control, body mechanics, and UE functional use.    PLAN:  OT FREQUENCY: 2x/week  OT DURATION: 4 weeks  PLANNED INTERVENTIONS: 97168 OT Re-evaluation, 97535 self care/ADL training, 02889 therapeutic exercise, 97530 therapeutic activity, 97112 neuromuscular re-education, 97140 manual therapy, 97035 ultrasound, 97018 paraffin, 02989 moist heat, 97032 electrical stimulation (manual), passive range of motion, functional mobility training, energy conservation, coping strategies training, patient/family education, and DME and/or AE instructions  CONSULTED AND AGREED WITH PLAN OF CARE: Patient  PLAN FOR NEXT SESSION: Wrist and  Digit ROM, strengthening, coordination tasks   Valentin Nightingale, OTR/L [phone] 09/04/2024, 5:14 PM

## 2024-09-07 ENCOUNTER — Encounter (HOSPITAL_COMMUNITY)

## 2024-09-08 ENCOUNTER — Ambulatory Visit (HOSPITAL_COMMUNITY): Payer: Self-pay | Admitting: Occupational Therapy

## 2024-09-08 ENCOUNTER — Encounter (HOSPITAL_COMMUNITY): Payer: Self-pay | Admitting: Occupational Therapy

## 2024-09-08 DIAGNOSIS — R29818 Other symptoms and signs involving the nervous system: Secondary | ICD-10-CM

## 2024-09-08 DIAGNOSIS — R278 Other lack of coordination: Secondary | ICD-10-CM | POA: Diagnosis not present

## 2024-09-08 NOTE — Therapy (Unsigned)
 OUTPATIENT OCCUPATIONAL THERAPY NEURO TREATMENT NOTE DISCHARGE NOTE   Patient Name: Angela Norman MRN: [id number] DOB:[birth year], 68 y.o., female Today's Date: 09/09/2024    PCP: Shona Norleen PEDLAR, MD REFERRING PROVIDER: Hillman Amour, MD  OCCUPATIONAL THERAPY DISCHARGE SUMMARY  Visits from Start of Care: 18  Current functional level related to goals / functional outcomes: Pt has minimal pain, has been educated on AE, and overall UE strength is 5/5.   Remaining deficits: Pt's grip and pinch, as well as RUE coordination remain poor.    Education / Equipment: Pt has been provided and educated on comprehensive HEP.    Plan: Patient agrees to discharge as she is plateauing in grip/pinch strength, as well as coordination. With other goals met.      END OF SESSION:  OT End of Session - 09/08/24 1617     Visit Number 28    Number of Visits 29    Date for Recertification  09/12/24    Authorization Type Medicare Part A and B    Progress Note Due on Visit 26    OT Start Time 1523    OT Stop Time 1617    OT Time Calculation (min) 54 min    Activity Tolerance Patient tolerated treatment well    Behavior During Therapy WFL for tasks assessed/performed           Past Medical History:  Diagnosis Date   Anxiety    Arthritis    Basal cell carcinoma    Charcot's joint of foot    bilateral   Diabetes mellitus without complication (HCC)    Fibromyalgia    Neuropathy    Past Surgical History:  Procedure Laterality Date   ACHILLES TENDON REPAIR Left    bone spur Left    --left foot   BREAST SURGERY     benign breast bx--left--fatty tissue   CARPAL TUNNEL RELEASE Bilateral    COLONOSCOPY     COLONOSCOPY WITH PROPOFOL  N/A 09/06/2022   Procedure: COLONOSCOPY WITH PROPOFOL ;  Surgeon: Shaaron Lamar HERO, MD;  Location: [facility];  Service: Endoscopy;  Laterality: N/A;  10:30am, asa 2   DILATION AND CURETTAGE OF UTERUS  2011   for cervical polyp   FOOT SURGERY  Left 03/2018   reconstructive surgery   POLYPECTOMY  09/06/2022   Procedure: POLYPECTOMY;  Surgeon: Shaaron Lamar HERO, MD;  [facility]: [facility];  Service: Endoscopy;;   Patient Active Problem List   Diagnosis Date Noted   Anxiety 06/28/2022   Arthritis 06/28/2022   Body fluid retention 06/28/2022   Hyperglycemia due to type 2 diabetes mellitus (HCC) 06/28/2022   Mixed hyperlipidemia 06/28/2022   Fibromyalgia 06/28/2022   Cough 10/25/2021   Musculoskeletal pain 05/21/2012   Stiffness of joint, not elsewhere classified, ankle and foot 08/13/2011   Difficulty walking 08/13/2011   Ankle weakness 08/13/2011    ONSET DATE: 08/2023  REFERRING DIAG:  G95.20 (ICD-10-CM) - Unspecified cord compression  R25.2 (ICD-10-CM) - Spastic   THERAPY DIAG:  Other lack of coordination  Other symptoms and signs involving the nervous system  Rationale for Evaluation and Treatment: Rehabilitation  SUBJECTIVE:   SUBJECTIVE STATEMENT: S: This past week has been pretty good.  PERTINENT HISTORY: Pt reported all this started with UTI that caused fracture f neck and indicated as C4-C7 fusion. Pt had multiple bouts of acute and inpatient rehab stays due to complications. Significant BP issues throughout this time. Had stay in multiple facilities. Pt reported various changes in functional  status, was requiring assist for ADLs and mobility since April 28th. Since then, pt has been home with Home Health services. Pt continues to report numbness in BUE and poor grip strength. Pt reports neuropathy in BLE. Prior to onset of November '24 was walking daily for 45-60 minutes.   PRECAUTIONS: Fall  WEIGHT BEARING RESTRICTIONS: No  PAIN:  Are you having pain? Yes: NPRS scale: 1/10 Pain location: my body Pain description: sore to the touch Aggravating factors: overuse, poor sleep Relieving factors: pain cream  FALLS: Has patient fallen in last 6 months? No  LIVING ENVIRONMENT: Lives with: lives with  their spouse Lives in: House/apartment Stairs: Pt lives in the basement with no stairs Has following equipment at home: Vannie - 2 wheeled and shower chair  PLOF: Independent  PATIENT GOALS: To improve mobility and strengthening  OBJECTIVE:  Note: Objective measures were completed at Evaluation unless otherwise noted.  HAND DOMINANCE: Right  ADLs: Overall ADLs: Pt has difficulty with all small object manipulation, including but not limited to buttons, shoe laces, and zippers. Additionally she has max difficulty with cooking and cleaning due to weakness in both hands and wrists.   MOBILITY STATUS: Hx of falls, difficulty with turns, and difficulty carrying objections with ambulation  POSTURE COMMENTS:  rounded shoulders Sitting balance: Moves/returns truncal midpoint 1-2 inches in multiple planes  ACTIVITY TOLERANCE: Activity tolerance: Fair tolerance - fatigues quicker than normal  FUNCTIONAL OUTCOME MEASURES: Quick Dash: 47.73 07/16/24: 20.45 08/13/24: 34.09 09/08/24: 18.18  UPPER EXTREMITY ROM:    All ROM is [facility]  UPPER EXTREMITY MMT:     MMT Right eval Left eval Right 07/16/24 Left 07/16/24 Right 08/13/24 Left 08/13/24 Right 09/08/24 Left 09/08/24  Shoulder flexion 5/5 5/5 5/5 5/5 5/5 5/5 5/5 5/5  Shoulder abduction 5/5 5/5 5/5 4/5 4+/5 4+/5 5/5 5/5  Shoulder internal rotation 5/5 5/5 5/5 5/5 5/5 5/5 5/5 5/5  Shoulder external rotation 5/5 5/5 4+/5 4+/5 5/5 4+/5 5/5 5/5  Elbow flexion 5/5 5/5 5/5 5/5 5/5 5/5 5/5 5/5  Elbow extension 5/5 5/5 4+/5 4+/5 4+/5 4+/5 5/5 5/5  Wrist flexion 4/5 4+/5 4+/5 5/5 4+/5 5/5 5/5 5/5  Wrist extension 4-/5 4/5 5/5 5/5 5/5 5/5 5/5 5/5  Wrist ulnar deviation 4-/5 4/5 4/5 4+/5 4+/5 5/5 5/5 5/5  Wrist radial deviation 4-/5 4-/5 5/5 5/5 5/5 5/5 5/5 5/5  Wrist pronation 4/5 4+/5 5/5 5/5 4/5 5/5 5/5 5/5  Wrist supination 4/5 4/5 4+/5 4+/5 4+/5 5/5 5/5 5/5  (Blank rows = not tested)  HAND FUNCTION: Grip strength: Right: 20 lbs;  Left: 27 lbs, Lateral pinch: Right: 5 lbs, Left: 9 lbs, and 3 point pinch: Right: 5 lbs, Left: 4 lbs 07/16/24: Grip strength: Right: 31 lbs; Left: 35 lbs, Lateral pinch: Right: 7 lbs, Left: 8 lbs, and 3 point pinch: Right: 7 lbs, Left: 9 lbs 08/13/24: Grip strength: Right: 30 lbs; Left: 44 lbs, Lateral pinch: Right: 6 lbs, Left: 8 lbs, and 3 point pinch: Right: 8 lbs, Left: 8 lbs 09/08/24: Grip strength: Right: 30 lbs; Left: 35 lbs, Lateral pinch: Right: 8 lbs, Left: 8 lbs, and 3 point pinch: Right: 8 lbs, Left: 10 lbs  COORDINATION: 06/30/24-9 Hole Peg test: Right: 33.28 sec; Left: 38.26 sec 07/16/24- 9 Hole Peg test: Right: 52.15 sec; Left: 49.88 sec 08/13/24: 9 Hole Peg test: Right: 51.00 sec; Left: 38.09 sec 09/08/24: 9 Hole Peg test: Right: 43.27 sec; Left: 30.10 sec  SENSATION: Pt has full numbness along median nerve of  R hand as well as dulled sensation and tingling in BUE up to elbows.   EDEMA: No swelling noted  OBSERVATIONS: adducted thumbs and 80% grip                                                                                                                             TREATMENT DATE:  09/08/24 -Shoulder Strengthening: 1#, seated, flexion, abduction, protraction, horizontal abduction, er/IR, x to v arms, x15 -Therabar: teal, pronation and supination bends, ulnar and radial bends, flexion and extension twists, x12 -Tendon Glides: BUE, 10 reps -9 hole peg test -Extensive discussion on AE for cooking tools, dressing tools, and overall energy conservation -Reassessment  09/03/24 -Shoulder Strengthening: 1#, seated, flexion, abduction, protraction, horizontal abduction, er/IR, x to v arms, x15 -grooved peg board -Therabar: teal, pronation and supination bends, ulnar and radial bends, flexion and extension twists, x12 -Tendon Glides: BUE, 10 reps  08/25/24 -Mariette: teal, pronation and supination bends, ulnar and radial bends, flexion and extension twists, x12 -Tendon  Glides: BUE, 10 reps -Gripper: 35# horizontal RUE and LUE medium beads, 37# squeezes RUE and LUE vertical -Pinch Strengthening: green clip, 2 stacks of 5 cubes, blue clips, 2 stacks of 5 cubes, BUE  08/20/24 -Strengthening: 3#, protraction, flexion, abduction, er/ir, horizontal abduction, bicep curls, x15 -Theraputty: red, roll into ball, flatten into pancake, PVC pipe pull, roll into log, tripod pinch, lateral pinch, roll into ball, squeeze  -Pinch tree: red, green, blue resistance clips -Tiny peg board with pattern, using tweezers   PATIENT EDUCATION: Education details: Continue HEP Person educated: Patient Education method: Programmer, Multimedia, Demonstration, and Handouts Education comprehension: verbalized understanding and returned demonstration  HOME EXERCISE PROGRAM: 8/1: Wrist ROM and Digit ROM 8/8: Shoulder A/ROM 8/20: Scapular Strengthening 9/4: Coin work 9/18: theraband wrist strengthening-red 10/14: PNF Strengthening    GOALS: Goals reviewed with patient? Yes  SHORT TERM GOALS: Target date: 06/19/24  Pt will be provided and educated on a comprehensive HEP for BUE mobility in order to complete ADL's and IADL's independently.   Goal status: MET  2.  Pt will improve BUE strength to 5/5 in order to lift and carry items during cooking and cleaning tasks.   Goal status: MET  3.  Pt will improve RUE grip by 15# and pinch by 3#; and LUE grip by 20# and pinch by 5# in order to grasp and hold pots and pans.  Goal status: NOT MET  4.  Pt will improve RUE coordination by completing 9 hole peg test in 45 or less in order to manipulate and complete fine motor tasks.   Goal status: MET  5. Pt will be educated on AE available to use during ADLs and compensatory strategies to improve safety, independence, and success with functional tasks.     Goal status: MET    6. Pt will decrease pain in BUE to improve ability to sleep for 2+ consecutive hours without waking due to pain.  Goal status: MET   ASSESSMENT:  CLINICAL IMPRESSION: Pt completed reassessment this session where she demonstrated improved overall strength and mobility. She also feels more confident in her abilities to be independent with all ADL's and some IADL's. Pt's grip and pinch remain plateaued. She was agreeable to discharge at this time with further education on AE for IADL use, as well as energy conservation techniques to improve functional abilities at home.   PERFORMANCE DEFICITS: in functional skills including ADLs, IADLs, coordination, dexterity, sensation, ROM, strength, pain, fascial restrictions, Fine motor control, Gross motor control, body mechanics, and UE functional use.    PLAN:  OT FREQUENCY: 2x/week  OT DURATION: 4 weeks  PLANNED INTERVENTIONS: 97168 OT Re-evaluation, 97535 self care/ADL training, 02889 therapeutic exercise, 97530 therapeutic activity, 97112 neuromuscular re-education, 97140 manual therapy, 97035 ultrasound, 97018 paraffin, 02989 moist heat, 97032 electrical stimulation (manual), passive range of motion, functional mobility training, energy conservation, coping strategies training, patient/family education, and DME and/or AE instructions  CONSULTED AND AGREED WITH PLAN OF CARE: Patient  PLAN FOR NEXT SESSION: PAULLETTE Valentin Nightingale, OTR/L [phone] 09/09/2024, 4:05 PM

## 2024-09-09 ENCOUNTER — Ambulatory Visit (HOSPITAL_COMMUNITY): Admitting: Psychiatry

## 2024-09-09 ENCOUNTER — Encounter (HOSPITAL_COMMUNITY): Payer: Self-pay

## 2024-09-10 ENCOUNTER — Encounter (HOSPITAL_COMMUNITY)

## 2024-09-14 ENCOUNTER — Encounter (HOSPITAL_COMMUNITY)

## 2024-09-15 ENCOUNTER — Encounter (HOSPITAL_COMMUNITY)

## 2024-09-16 ENCOUNTER — Encounter (HOSPITAL_COMMUNITY)

## 2024-09-23 ENCOUNTER — Ambulatory Visit (HOSPITAL_COMMUNITY): Admitting: Psychiatry

## 2024-10-08 ENCOUNTER — Ambulatory Visit (INDEPENDENT_AMBULATORY_CARE_PROVIDER_SITE_OTHER): Admitting: Psychiatry

## 2024-10-08 DIAGNOSIS — F411 Generalized anxiety disorder: Secondary | ICD-10-CM

## 2024-10-08 NOTE — Progress Notes (Signed)
 IN- PERSON  THERAPIST PROGRESS NOTE  Session Time:  Thursday 10/08/2024 11:10 AM  - 11:58 AM   Participation Level: Active  Behavioral Response: CasualAlertAnxious  Type of Therapy: Individual Therapy  Treatment Goals addressed:  LTG:Pt will reduce anxiety AEB decreased episodes of worry from daily to 2 x per week for 30 days per pt's report.   Pt will identify/challenge/replace anxiety provoking thoughts, learn strategies to limit worry time   ProgressTowards Goals: Progressing   Interventions: CBT and Supportive  Summary: Angela Norman is a 68 y.o. female who is a returning pt to this clinician and last was seen about 9 months ago.  Pt reports no psychiatric hospitalizations. She reports no previous involvement in therapy.  Patient reports needing to talk to someone as she has experienced several transitions in the past several months.  She incurred a spinal cord injury on September 05, 2023  As a result, she had surgery and participated in rehab for several months.  She reports becoming more anxious since her return home in April 2025.  She is experiencing loneliness.  She also expresses worry about her husband as he went into a deep depression when she was injured.  He just completed TMS and is slightly better.  Patient reports irritability, ruminating about the past, restlessness, worry about her family and the future.  Patient last was seen 6 weeks ago. She reports enjoying attending the retirees luncheon and reports none of her report if thoughts came true.  She reports minimal anxiety prior to the luncheon.  Patient reports increased worry and frustration as husband seems to be more depressed.  She reports they have minimal interaction during the mornings.  Patient reports feelings of loneliness.  She also reports increased frustration regarding physical issues and pain.  Patient expresses sadness regarding her changed physical functioning.  She has difficulty doing certain tasks  due to problems with her right hand gripping and holding items.  She continues to experience problems with nerve pain in her feet. She also has diagnosis of fibromyalgia.  Patient states having a bad day today but trying to be thankful.  Suicidal/Homicidal: Nowithout intent/plan  Therapist Response: Reviewed symptoms, discussed stressors, facilitated expression of thoughts and feelings, validated feelings, discussed acknowledging and making room for feelings, discussed self-compassion and self-care, assisted pt identify ways to self-nurture.    diagnosis: Generalized anxiety disorder  Collaboration of Care: Other none needed at this session  Patient/Guardian was advised Release of Information must be obtained prior to any record release in order to collaborate their care with an outside provider. Patient/Guardian was advised if they have not already done so to contact the registration department to sign all necessary forms in order for us  to release information regarding their care.   Consent: Patient/Guardian gives verbal consent for treatment and assignment of benefits for services provided during this visit. Patient/Guardian expressed understanding and agreed to proceed.   Winton FORBES Rubinstein, LCSW 10/08/2024

## 2024-10-26 ENCOUNTER — Other Ambulatory Visit (HOSPITAL_COMMUNITY): Payer: Self-pay | Admitting: Nurse Practitioner

## 2024-10-26 DIAGNOSIS — R911 Solitary pulmonary nodule: Secondary | ICD-10-CM

## 2024-10-29 ENCOUNTER — Ambulatory Visit (INDEPENDENT_AMBULATORY_CARE_PROVIDER_SITE_OTHER): Admitting: Psychiatry

## 2024-10-29 DIAGNOSIS — F411 Generalized anxiety disorder: Secondary | ICD-10-CM | POA: Diagnosis not present

## 2024-10-29 NOTE — Progress Notes (Signed)
 IN- PERSON  THERAPIST PROGRESS NOTE  Session Time:  Thursday 10/30/2023 1:07 PM - 1:58 PM  Participation Level: Active  Behavioral Response: CasualAlertAnxious  Type of Therapy: Individual Therapy  Treatment Goals addressed:  LTG:Pt will reduce anxiety AEB decreased episodes of worry from daily to 2 x per week for 30 days per pt's report.   Pt will identify/challenge/replace anxiety provoking thoughts, learn strategies to limit worry time   ProgressTowards Goals: Progressing   Interventions: CBT and Supportive  Summary: Angela Norman is a 69 y.o. female who is a returning pt to this clinician and last was seen about 9 months ago.  Pt reports no psychiatric hospitalizations. She reports no previous involvement in therapy.  Patient reports needing to talk to someone as she has experienced several transitions in the past several months.  She incurred a spinal cord injury on September 05, 2023  As a result, she had surgery and participated in rehab for several months.  She reports becoming more anxious since her return home in April 2025.  She is experiencing loneliness.  She also expresses worry about her husband as he went into a deep depression when she was injured.  He just completed TMS and is slightly better.  Patient reports irritability, ruminating about the past, restlessness, worry about her family and the future.  Patient last was seen 2-3 weeks ago. She reports mood has been up and down. She enjoyed celebrating the holidays with her family but then becoming down wants holiday visits for over.  She continues to have.'s of worry/ruminating thoughts about husband as he continues to struggle with symptoms of depression.  Patient states sometimes being able to just let things go but then becoming more depressed when husband is having a rough day.  She expresses disappointment their retirement is not the way she thought it would be.  She expresses sadness and frustration as she states  feeling bad for her husband because he cannot enjoy life.  Patient also expresses continued frustration over her change function but also feels guilty as she reports she should be grateful for what she can do.  Patient also expresses frustration as she is experiencing fatigue. Suicidal/Homicidal: Nowithout intent/plan  Therapist Response: Reviewed symptoms, discussed stressors, facilitated expression of thoughts and feelings, validated feelings, assisted patient and examine her thoughts regarding husband's situation as well as the effects of his mood on her behavior,  reviewed mindfulness and the window of tolerance, discussed rationale for and developed plan with patient to use mindfulness activity (leaves on a stream) to cope with ruminating thoughts, checked out interactive audio activity and provided patient with access code to assist patient in her efforts,discussed acknowledging and making room for feelings    diagnosis: Generalized Anxiety Disorder  Collaboration of Care: Other none needed at this session  Patient/Guardian was advised Release of Information must be obtained prior to any record release in order to collaborate their care with an outside provider. Patient/Guardian was advised if they have not already done so to contact the registration department to sign all necessary forms in order for us  to release information regarding their care.   Consent: Patient/Guardian gives verbal consent for treatment and assignment of benefits for services provided during this visit. Patient/Guardian expressed understanding and agreed to proceed.   Winton FORBES Rubinstein, LCSW 10/29/2024

## 2024-11-03 ENCOUNTER — Ambulatory Visit (HOSPITAL_COMMUNITY): Attending: Surgery | Admitting: Occupational Therapy

## 2024-11-03 ENCOUNTER — Other Ambulatory Visit: Payer: Self-pay

## 2024-11-03 ENCOUNTER — Encounter (HOSPITAL_COMMUNITY): Payer: Self-pay | Admitting: Occupational Therapy

## 2024-11-03 DIAGNOSIS — R29818 Other symptoms and signs involving the nervous system: Secondary | ICD-10-CM | POA: Diagnosis present

## 2024-11-03 DIAGNOSIS — R278 Other lack of coordination: Secondary | ICD-10-CM | POA: Insufficient documentation

## 2024-11-03 NOTE — Therapy (Signed)
 " OUTPATIENT OCCUPATIONAL THERAPY NEURO EVALUATION   Patient Name: Angela Norman MRN: [id number] DOB:09/10/1956, 69 y.o., female Today's Date: 11/03/2024        END OF SESSION:  OT End of Session - 11/03/24 1606     Visit Number 1    Number of Visits 6    Date for Recertification  12/15/24    Authorization Type Medicare Part A and B    Progress Note Due on Visit 10    OT Start Time 1348    OT Stop Time 1423    OT Time Calculation (min) 35 min    Activity Tolerance Patient tolerated treatment well    Behavior During Therapy WFL for tasks assessed/performed            Past Medical History:  Diagnosis Date   Anxiety    Arthritis    Basal cell carcinoma    Charcot's joint of foot    bilateral   Diabetes mellitus without complication (HCC)    Fibromyalgia    Neuropathy    Past Surgical History:  Procedure Laterality Date   ACHILLES TENDON REPAIR Left    bone spur Left    --left foot   BREAST SURGERY     benign breast bx--left--fatty tissue   CARPAL TUNNEL RELEASE Bilateral    COLONOSCOPY     COLONOSCOPY WITH PROPOFOL  N/A 09/06/2022   Procedure: COLONOSCOPY WITH PROPOFOL ;  Surgeon: Shaaron Lamar HERO, MD;  Location: [facility];  Service: Endoscopy;  Laterality: N/A;  10:30am, asa 2   DILATION AND CURETTAGE OF UTERUS  2011   for cervical polyp   FOOT SURGERY Left 03/2018   reconstructive surgery   POLYPECTOMY  09/06/2022   Procedure: POLYPECTOMY;  Surgeon: Shaaron Lamar HERO, MD;  Location: [facility];  Service: Endoscopy;;   Patient Active Problem List   Diagnosis Date Noted   Anxiety 06/28/2022   Arthritis 06/28/2022   Body fluid retention 06/28/2022   Hyperglycemia due to type 2 diabetes mellitus (HCC) 06/28/2022   Mixed hyperlipidemia 06/28/2022   Fibromyalgia 06/28/2022   Cough 10/25/2021   Musculoskeletal pain 05/21/2012   Stiffness of joint, not elsewhere classified, ankle and foot 08/13/2011   Difficulty walking 08/13/2011   Ankle weakness  08/13/2011   PCP: Shona Norleen PEDLAR, MD  REFERRING PROVIDER: Almarie Prophet, MD  ONSET DATE: 08/2023  REFERRING DIAG:  G95.20 (ICD-10-CM) - Unspecified cord compression  R25.2 (ICD-10-CM) - Spastic   THERAPY DIAG:  Other lack of coordination  Other symptoms and signs involving the nervous system  Rationale for Evaluation and Treatment: Rehabilitation  SUBJECTIVE:   SUBJECTIVE STATEMENT: S: I feel like I'm stronger  PERTINENT HISTORY: Pt reported all this started with UTI that caused fracture f neck and indicated as C4-C7 fusion. Pt had multiple bouts of acute and inpatient rehab stays due to complications. Significant BP issues throughout this time. Had stay in multiple facilities. Pt reported various changes in functional status, was requiring assist for ADLs and mobility since April 28th. Since then, pt has been home with Home Health services. Pt continues to report numbness in BUE and poor grip strength. Pt reports neuropathy in BLE. Prior to onset of November '24 was walking daily for 45-60 minutes. Pt received OT at this clinic from July 2025-Nov 2025.   PRECAUTIONS: Fall  WEIGHT BEARING RESTRICTIONS: No  PAIN:  Are you having pain? No  FALLS: Has patient fallen in last 6 months? No  LIVING ENVIRONMENT: Lives with: lives with  their spouse Lives in: House/apartment Stairs: Pt lives in the basement with no stairs Has following equipment at home: Vannie - 2 wheeled and shower chair  PLOF: Independent  PATIENT GOALS: To improve mobility and strengthening  OBJECTIVE:  Note: Objective measures were completed at Evaluation unless otherwise noted.  HAND DOMINANCE: Right  ADLs: Overall ADLs: Pt is having difficulty with stirring during meal preparation, holding a washcloth in the right hand. If meat is tender she can cut with a regular knife, uses a rocker knife with ease. Has greater ease with reaching up into cabinets. Pt reports she is doing better with bending down,  still has difficulty with reaching down for dusting and with lifting tasks during housekeeping. Pt would like to be able to improve her manipulation of objects and tedious tasks. Pt reports she was able to write Christmas cards over the holidays but would like her handwriting to be a bit better.    MOBILITY STATUS: Hx of falls, difficulty with turns, and difficulty carrying objections with ambulation  POSTURE COMMENTS:  rounded shoulders Sitting balance: Moves/returns truncal midpoint 1-2 inches in multiple planes  ACTIVITY TOLERANCE: Activity tolerance: Fair tolerance - fatigues quicker than normal  FUNCTIONAL OUTCOME MEASURES: Quick Dash:  QUICK DASH  Please rate your ability do the following activities in the last week by selecting the number below the appropriate response.   Activities Rating  Open a tight or new jar.  2 = Mild difficulty  Do heavy household chores (e.g., wash walls, floors). 1 = No difficulty   Carry a shopping bag or briefcase 1 = No difficulty   Wash your back. 2 = Mild difficulty  Use a knife to cut food. 2 = Mild difficulty  Recreational activities in which you take some force or impact through your arm, shoulder or hand (e.g., golf, hammering, tennis, etc.). 3 = Moderate difficulty  During the past week, to what extent has your arm, shoulder or hand problem interfered with your normal social activities with family, friends, neighbors or groups?  1 = Not at all  During the past week, were you limited in your work or other regular daily activities as a result of your arm, shoulder or hand problem? 1 = Not limited at all  Rate the severity of the following symptoms in the last week: Arm, Shoulder, or hand pain. 2 = Mild  Rate the severity of the following symptoms in the last week: Tingling (pins and needles) in your arm, shoulder or hand. 3 = Moderate  During the past week, how much difficulty have you had sleeping because of the pain in your arm, shoulder or  hand?  1 = No difficulty   (A QuickDASH score may not be calculated if there is greater than 1 missing item.)  Quick Dash Disability/Symptom Score: [(sum of 19 (n) responses/11 (n)] -1x 25 = 18.18  Minimally Clinically Important Difference (MCID): 15-20 points  (Franchignoni, F. et al. (2013). Minimally clinically important difference of the disabilities of the arm, shoulder, and hand outcome measures (DASH) and its shortened version (Quick DASH). Journal of Orthopaedic & Sports Physical Therapy, 44(1), 30-39)    UPPER EXTREMITY ROM:    All ROM is [facility]  UPPER EXTREMITY MMT:     MMT Left 09/08/24 Left 11/03/24  Shoulder flexion 5/5 4+/5  Shoulder abduction 5/5 4+/5  Shoulder internal rotation 5/5 5/5  Shoulder external rotation 5/5 5/5  Elbow flexion 5/5 5/5  Elbow extension 5/5 5/5  Wrist flexion 5/5 5/5  Wrist extension 5/5 5/5  Wrist ulnar deviation 5/5 5/5  Wrist radial deviation 5/5 5/5  Wrist pronation 5/5 5/5  Wrist supination 5/5 5/5  (Blank rows = not tested)  HAND FUNCTION: 09/08/24: Grip strength: Right: 30 lbs; Left: 35 lbs, Lateral pinch: Right: 8 lbs, Left: 8 lbs, and 3 point pinch: Right: 8 lbs, Left: 10 lbs Grip strength: Right: 30 lbs; Left: 35 lbs, Lateral pinch: Right: 9 lbs, Left: 9 lbs, and 3 point pinch: Right: 7 lbs, Left: 11 lbs    COORDINATION: 09/08/24: 9 Hole Peg test: Right: 43.27 sec; Left: 30.10 sec 11/03/24: 9 Hole Peg test: Right: 37.77 sec; Left: 31.40 sec   SENSATION: Pt has full numbness along median nerve of R hand as well as dulled sensation and tingling in BUE up to elbows.   EDEMA: No swelling noted  OBSERVATIONS: adducted thumbs and 80% grip                                                                                                                             TREATMENT DATE:      PATIENT EDUCATION: Education details: Write down tasks at home that are difficult between now and next appt Person educated:  Patient Education method: Explanation, Demonstration, and Handouts Education comprehension: verbalized understanding and returned demonstration  HOME EXERCISE PROGRAM: Begin at first treatment   GOALS: Goals reviewed with patient? Yes  SHORT TERM GOALS: Target date: 12/15/24  Pt will be provided and educated on a comprehensive HEP for BUE mobility in order to complete ADL's and IADL's independently.   Goal status: NEW  2.  Pt will improve LUE shoulder strength to 5/5 in order to lift and carry items during cooking and cleaning tasks.   Goal status: NEW  3.  Pt will improve BUE grip by 10# and pinch by 3# to improve ability to grasp and hold pots and pans during meal preparation tasks.   Goal status: NEW  4.  Pt will improve RUE coordination by completing 9 hole peg test in 32 or less in order to manipulate and complete fine motor tasks such as operating buttons, jewelry, grooming items.    Goal status: NEW  5. Pt will be educated on DME and AE available to use during ADLs and compensatory strategies to improve safety, independence, and success with bathing and self-care requiring bending over.      Goal status: NEW    ASSESSMENT:  CLINICAL IMPRESSION: Pt returns for evaluation after discharging in November 2025. Pt reports she is feeling stronger but still has difficulty with self-care and household tasks, tedious fine motor work. Pt demonstrating some mild regression in shoulder strength, has maintained her grip and pinch strength even improving pinch slightly. Right hand coordination has improved, but left hand has regressed very slightly.    PERFORMANCE DEFICITS: in functional skills including ADLs, IADLs, coordination, dexterity, sensation, ROM, strength, pain, fascial restrictions, Fine motor control, Gross motor control, body mechanics,  and UE functional use.  IMPAIRMENTS: are limiting patient from ADLs, IADLs, rest and sleep, work, leisure, and social participation.     CO-MORBIDITIES: may have co-morbidities  that affects occupational performance. Patient will benefit from skilled OT to address above impairments and improve overall function.   MODIFICATION OR ASSISTANCE TO COMPLETE EVALUATION: Min-Moderate modification of tasks or assist with assess necessary to complete an evaluation.   OT OCCUPATIONAL PROFILE AND HISTORY: Detailed assessment: Review of records and additional review of physical, cognitive, psychosocial history related to current functional performance.   CLINICAL DECISION MAKING: Moderate - several treatment options, min-mod task modification necessary   REHAB POTENTIAL: Good   EVALUATION COMPLEXITY: Moderate        PLAN:  OT FREQUENCY: 1x/week  OT DURATION: 6 weeks  PLANNED INTERVENTIONS: 97168 OT Re-evaluation, 97535 self care/ADL training, 02889 therapeutic exercise, 97530 therapeutic activity, 97112 neuromuscular re-education, 97140 manual therapy, 97035 ultrasound, 97018 paraffin, 02989 moist heat, 97032 electrical stimulation (manual), passive range of motion, functional mobility training, energy conservation, coping strategies training, patient/family education, and DME and/or AE instructions  CONSULTED AND AGREED WITH PLAN OF CARE: Patient  PLAN FOR NEXT SESSION: Initiate LUE strengthening, bilateral grip/pinch strengthening, observe shower transfer and problem solve bathing   Sonny Cory, OTR/L  [phone] 11/03/2024, 4:07 PM  "

## 2024-11-05 ENCOUNTER — Ambulatory Visit (HOSPITAL_COMMUNITY)

## 2024-11-05 ENCOUNTER — Ambulatory Visit (HOSPITAL_COMMUNITY)
Admission: RE | Admit: 2024-11-05 | Discharge: 2024-11-05 | Disposition: A | Discharge status: Home / Self Care | Source: Ambulatory Visit | Attending: Nurse Practitioner | Admitting: Nurse Practitioner

## 2024-11-05 DIAGNOSIS — R911 Solitary pulmonary nodule: Secondary | ICD-10-CM | POA: Diagnosis present

## 2024-11-12 ENCOUNTER — Ambulatory Visit (HOSPITAL_COMMUNITY): Admitting: Psychiatry

## 2024-11-12 DIAGNOSIS — F411 Generalized anxiety disorder: Secondary | ICD-10-CM | POA: Diagnosis not present

## 2024-11-12 NOTE — Progress Notes (Signed)
 Virtual Visit via Telephone Note  I connected with Angela Norman on 11/12/24 at 11:25 AM EST by telephone and verified that I am speaking with the correct person using two identifiers.  Location: Patient: Home Provider: [facility] [place] office    I discussed the limitations, risks, security and privacy concerns of performing an evaluation and management service by telephone and the availability of in person appointments. I also discussed with the patient that there may be a patient responsible charge related to this service. The patient expressed understanding and agreed to proceed.     I provided 33 minutes of non-face-to-face time during this encounter.   Winton FORBES Rubinstein, LCSW    THERAPIST PROGRESS NOTE  Session Time:  Thursday 11/13/2023 11:25 AM -  11:58 AM   Participation Level: Active  Behavioral Response: CasualAlertAnxious  Type of Therapy: Individual Therapy  Treatment Goals addressed:  LTG:Pt will reduce anxiety AEB decreased episodes of worry from daily to 2 x per week for 30 days per pt's report.   Pt will identify/challenge/replace anxiety provoking thoughts, learn strategies to limit worry time   ProgressTowards Goals: Progressing   Interventions: CBT and Supportive  Summary: Angela Norman is a 69 y.o. female who is a returning pt to this clinician and last was seen about 9 months ago.  Pt reports no psychiatric hospitalizations. She reports no previous involvement in therapy.  Patient reports needing to talk to someone as she has experienced several transitions in the past several months.  She incurred a spinal cord injury on September 05, 2023  As a result, she had surgery and participated in rehab for several months.  She reports becoming more anxious since her return home in April 2025.  She is experiencing loneliness.  She also expresses worry about her husband as he went into a deep depression when she was injured.  He just completed TMS and  is slightly better.  Patient reports irritability, ruminating about the past, restlessness, worry about her family and the future.  Patient last was seen 2-3 weeks ago. She reports improved mood, decreased worry,  and increased involvement in activities since last session.  She expresses increased acceptance regarding husband's situation.  Per her report, she feels better since doing this and has increased her involvement in pursuing her own interests.  She has been reading, talking with friends on the phone and working on a puzzle.  She also has sorted clothes to send to [facility]. She is looking forward to going to a book club tomorrow.   Pt reports she has been using leaves on a stream exercise and reports this has helped her not dwell on things and she she has been able to let things go. Pt is pleased with her progress. Suicidal/Homicidal: Nowithout intent/plan  Therapist Response: Reviewed symptoms, discussed stressors, facilitated expression of thoughts and feelings, validated feelings, praised and reinforced pt's us  of mindfulness and leaves on a stream, assisted pt identify effects of use, discussed patient's progress in treatment, assisted pt identify ways to maintain consistent efforts, discussed stepdown plan to termination to include 2 more sessions.   diagnosis: Generalized Anxiety Disorder  Collaboration of Care: Other none needed at this session  Patient/Guardian was advised Release of Information must be obtained prior to any record release in order to collaborate their care with an outside provider. Patient/Guardian was advised if they have not already done so to contact the registration department to sign all necessary forms in order for us  to release  information regarding their care.   Consent: Patient/Guardian gives verbal consent for treatment and assignment of benefits for services provided during this visit. Patient/Guardian expressed understanding and agreed to proceed.   Winton FORBES Rubinstein, LCSW 11/12/2024

## 2024-11-18 ENCOUNTER — Ambulatory Visit (HOSPITAL_COMMUNITY): Admitting: Occupational Therapy

## 2024-11-18 ENCOUNTER — Encounter (HOSPITAL_COMMUNITY): Payer: Self-pay

## 2024-11-24 ENCOUNTER — Ambulatory Visit (HOSPITAL_COMMUNITY): Attending: Surgery

## 2024-11-24 ENCOUNTER — Other Ambulatory Visit: Payer: Self-pay

## 2024-11-24 DIAGNOSIS — G9520 Unspecified cord compression: Secondary | ICD-10-CM

## 2024-11-24 DIAGNOSIS — R278 Other lack of coordination: Secondary | ICD-10-CM

## 2024-11-24 DIAGNOSIS — R262 Difficulty in walking, not elsewhere classified: Secondary | ICD-10-CM

## 2024-11-24 DIAGNOSIS — R29818 Other symptoms and signs involving the nervous system: Secondary | ICD-10-CM

## 2024-11-24 DIAGNOSIS — M6281 Muscle weakness (generalized): Secondary | ICD-10-CM

## 2024-11-24 DIAGNOSIS — Z7409 Other reduced mobility: Secondary | ICD-10-CM

## 2024-11-24 DIAGNOSIS — R2689 Other abnormalities of gait and mobility: Secondary | ICD-10-CM

## 2024-11-25 ENCOUNTER — Ambulatory Visit (HOSPITAL_COMMUNITY): Admitting: Psychiatry

## 2024-11-25 DIAGNOSIS — F411 Generalized anxiety disorder: Secondary | ICD-10-CM

## 2024-11-25 NOTE — Progress Notes (Signed)
" °  IN-PERSON  THERAPIST PROGRESS NOTE  Session Time:  Wednesday 11/25/2024 4:02 PM - 4:55 PM   Participation Level: Active  Behavioral Response: CasualAlertAnxious  Type of Therapy: Individual Therapy  Treatment Goals addressed:  LTG:Pt will reduce anxiety AEB decreased episodes of worry from daily to 2 x per week for 30 days per pt's report.   Pt will identify/challenge/replace anxiety provoking thoughts, learn strategies to limit worry time   ProgressTowards Goals: Progressing   Interventions: CBT and Supportive  Summary: Angela Norman is a 69 y.o. female who is a returning pt to this clinician and last was seen about 9 months ago.  Pt reports no psychiatric hospitalizations. She reports no previous involvement in therapy.  Patient reports needing to talk to someone as she has experienced several transitions in the past several months.  She incurred a spinal cord injury on September 05, 2023  As a result, she had surgery and participated in rehab for several months.  She reports becoming more anxious since her return home in April 2025.  She is experiencing loneliness.  She also expresses worry about her husband as he went into a deep depression when she was injured.  He just completed TMS and is slightly better.  Patient reports irritability, ruminating about the past, restlessness, worry about her family and the future.  Patient last was seen 2-3 weeks ago. She reports minimal anxiety, increased involvement in activities, and increased socialization since last  since last session.  She continues to engage in reading, talking with friends on her phone, doing small household tasks.  She recently enjoyed going to her book club meeting.  She also enjoyed her daughter and daughter's family staying with patient and her husband overnight this weekend.  She reports continued acceptance regarding husband's situation but also is thankful he is starting to experience some improvement in his mood  since a change in his medication.  She continues to report no longer dwelling on things and being able to let things go.  Patient remains very pleased with her progress in therapy and expresses confidence in her abilities to successfully use helpful coping strategies. Suicidal/Homicidal: Nowithout intent/plan  Therapist Response: Reviewed symptoms,  discussed patient's progress in treatment, assisted patient develop mental health maintenance plan, agreed to terminate services today as patient expresses continued confidence in her ability to use coping strategies successfully and has accomplished her treatment goals, encouraged patient to contact this practice should she need psychotherapy services in the future     diagnosis: Generalized Anxiety Disorder  Collaboration of Care: Other none needed at this session  Patient/Guardian was advised Release of Information must be obtained prior to any record release in order to collaborate their care with an outside provider. Patient/Guardian was advised if they have not already done so to contact the registration department to sign all necessary forms in order for us  to release information regarding their care.   Consent: Patient/Guardian gives verbal consent for treatment and assignment of benefits for services provided during this visit. Patient/Guardian expressed understanding and agreed to proceed.   Winton FORBES Rubinstein, LCSW 11/25/2024    Outpatient Therapist Discharge Summary  JORJA EMPIE    1956-07-28   Admission Date: 05/14/2024 Discharge Date: 11/25/2024 Reason for Discharge: Treatment goals accomplished Diagnosis:  Axis I:  Generalized anxiety disorder   Comments: Patient is encouraged to call this practice should she need psychotherapy services in the future.  Terrick Allred E Deberah Adolf LCSW "

## 2024-12-01 ENCOUNTER — Ambulatory Visit (HOSPITAL_COMMUNITY): Admitting: Occupational Therapy

## 2024-12-03 ENCOUNTER — Ambulatory Visit (HOSPITAL_COMMUNITY): Admitting: Physical Therapy

## 2024-12-08 ENCOUNTER — Ambulatory Visit (HOSPITAL_COMMUNITY): Admitting: Physical Therapy

## 2024-12-08 ENCOUNTER — Ambulatory Visit (HOSPITAL_COMMUNITY): Admitting: Occupational Therapy

## 2024-12-10 ENCOUNTER — Ambulatory Visit (HOSPITAL_COMMUNITY)

## 2024-12-14 ENCOUNTER — Ambulatory Visit (HOSPITAL_COMMUNITY)

## 2024-12-15 ENCOUNTER — Ambulatory Visit (HOSPITAL_COMMUNITY): Admitting: Occupational Therapy

## 2024-12-16 ENCOUNTER — Ambulatory Visit (HOSPITAL_COMMUNITY): Admitting: Psychiatry

## 2024-12-18 ENCOUNTER — Ambulatory Visit (HOSPITAL_COMMUNITY)

## 2024-12-21 ENCOUNTER — Encounter: Admitting: Obstetrics and Gynecology

## 2024-12-22 ENCOUNTER — Ambulatory Visit (HOSPITAL_COMMUNITY): Attending: Surgery | Admitting: Occupational Therapy

## 2024-12-25 ENCOUNTER — Ambulatory Visit (HOSPITAL_COMMUNITY)

## 2024-12-28 ENCOUNTER — Ambulatory Visit (HOSPITAL_COMMUNITY)

## 2024-12-29 ENCOUNTER — Ambulatory Visit (HOSPITAL_COMMUNITY): Admitting: Occupational Therapy

## 2025-01-01 ENCOUNTER — Ambulatory Visit (HOSPITAL_COMMUNITY)

## 2025-01-01 ENCOUNTER — Ambulatory Visit (HOSPITAL_COMMUNITY): Admitting: Occupational Therapy

## 2025-01-04 ENCOUNTER — Ambulatory Visit (HOSPITAL_COMMUNITY)

## 2025-01-07 ENCOUNTER — Ambulatory Visit (HOSPITAL_COMMUNITY): Admitting: Physical Therapy

## 2025-01-11 ENCOUNTER — Ambulatory Visit (HOSPITAL_COMMUNITY)
